# Patient Record
Sex: Female | Born: 1945 | Race: White | Hispanic: No | Marital: Single | Smoking: Never smoker
Health system: Southern US, Community
[De-identification: ages and names within clinical notes are randomized; demographics above are authoritative.]

## PROBLEM LIST (undated history)

## (undated) DIAGNOSIS — R4701 Aphasia: Secondary | ICD-10-CM

## (undated) DIAGNOSIS — Z9109 Other allergy status, other than to drugs and biological substances: Secondary | ICD-10-CM

## (undated) DIAGNOSIS — E041 Nontoxic single thyroid nodule: Secondary | ICD-10-CM

## (undated) HISTORY — DX: Nontoxic single thyroid nodule: E04.1

## (undated) HISTORY — DX: Other allergy status, other than to drugs and biological substances: Z91.09

## (undated) HISTORY — PX: TONSILLECTOMY: SUR1361

---

## 1969-12-16 HISTORY — PX: BREAST EXCISIONAL BIOPSY: SUR124

## 2005-07-12 ENCOUNTER — Ambulatory Visit: Payer: Self-pay | Admitting: Otolaryngology

## 2013-01-12 ENCOUNTER — Encounter: Payer: Self-pay | Admitting: Internal Medicine

## 2013-01-12 ENCOUNTER — Ambulatory Visit (INDEPENDENT_AMBULATORY_CARE_PROVIDER_SITE_OTHER): Payer: Medicare Other | Admitting: Internal Medicine

## 2013-01-12 VITALS — BP 110/70 | HR 79 | Temp 99.2°F | Ht 63.0 in | Wt 96.5 lb

## 2013-01-12 DIAGNOSIS — R634 Abnormal weight loss: Secondary | ICD-10-CM

## 2013-01-12 DIAGNOSIS — Z139 Encounter for screening, unspecified: Secondary | ICD-10-CM

## 2013-01-12 DIAGNOSIS — E041 Nontoxic single thyroid nodule: Secondary | ICD-10-CM | POA: Diagnosis not present

## 2013-01-13 ENCOUNTER — Encounter: Payer: Self-pay | Admitting: Internal Medicine

## 2013-01-13 DIAGNOSIS — E041 Nontoxic single thyroid nodule: Secondary | ICD-10-CM | POA: Insufficient documentation

## 2013-01-13 NOTE — Progress Notes (Signed)
  Subjective:    Patient ID: Carol Beard, female    DOB: May 17, 1946, 67 y.o.   MRN: [id number]  HPI 67 year old female with past history of thyroid nodule and weight loss who comes in today to follow up on these issues as well as to establish care.  She states that she has not had a primary care physician.  She previously was seeing Dr Andee Poles for her thyroid.  States she had two biopsies - negative.  Unsure if nodule is growing.  No significant trouble swallowing.  No acid reflux.  No chest pain or tightness.  No sob.  No significant abdominal pain or cramping.  She does report occasionally noticing some LLQ discomfort.  Not sure of any precipitating factors.  No urinary change.  She did start a walking program last year.  States she walks 30 minutes per day.  She also cut back on her po intake (ie no snacks, etc).  States that two years ago, she weight 120 lbs.    Past Medical History  Diagnosis Date  . Environmental allergies   . Thyroid nodule     biopsy x 2 negative    Review of Systems Patient denies any headache, lightheadedness or dizziness.  No allergy symptoms or sinus congestion.  No chest pain, tightness or palpitations.  No increased shortness of breath, cough or congestion.  No nausea or vomiting.  No acid reflux.  No significant abdominal pain or cramping. Does report the occasional LLQ discomfort.   No bowel change, such as diarrhea, constipation, BRBPR or melana.  No urine change.   Increased fatigue.       Objective:   Physical Exam Filed Vitals:   01/12/13 1327  BP: 110/70  Pulse: 79  Temp: 99.2 F (11.34 C)   67 year old female in no acute distress.   HEENT:  Nares- clear.  Oropharynx - without lesions. NECK:  Supple.  Nontender.  No audible bruit.  Palpable nodule - thyroid.  Nontender.   HEART:  Appears to be regular. LUNGS:  No crackles or wheezing audible.  Respirations even and unlabored.  RADIAL PULSE:  Equal bilaterally.   ABDOMEN:  Soft, nontender.  Bowel  sounds present and normal.  No audible abdominal bruit.   EXTREMITIES:  No increased edema present.  DP pulses palpable and equal bilaterally.      NEURO:  No focal neuro deficits noted.  SKIN:  No rash.        Assessment & Plan:  FATIGUE.  Check cbc, met c and tsh.    WIEGHT LOSS.  Discussed at length with her today.  She is eating, but has adjusted her diet a lot.  Discussed the need to eat regular meals.  Will check cbc, met c and tsh.  Schedule mammogram.  She has never had a colonoscopy.  Will need to arrange.  Will pursue above first.  Will notify me when agreeable.  Follow weight.    CARDIOVASCULAR.  Asymptomatic.  Follow.  Exercises regularly.    PULMONARY.  No cough or congestion.  No sob.  Follow.  May need cxr for the weight loss.    HEALTH MAINTENANCE.  Will schedule a physical - next visit.  Schedule mammogram.  GI as outlined.    I spent over 45 minutes with this patient and more than 50% of the time was spent in consultation regarding the above.

## 2013-01-13 NOTE — Assessment & Plan Note (Signed)
Obtain records from [facility].  Check tsh, free t3 and free t4.  Further w/up pending.  Has had two biopsies.  Negative.

## 2013-01-20 ENCOUNTER — Telehealth: Payer: Self-pay | Admitting: Internal Medicine

## 2013-01-20 NOTE — Telephone Encounter (Signed)
Patient called wanting the physician to call the insurance company to see if her vitamin D will be covered . They needed a diagnosis code and the patient did not have one to give for her blood work . She will have labs done on 2.7.14.

## 2013-01-20 NOTE — Telephone Encounter (Signed)
I did not order a vitamin D level to be checked because the diagnosis code is screening - insurance will not cover this.

## 2013-01-20 NOTE — Telephone Encounter (Signed)
Left message for patient to return call.

## 2013-01-20 NOTE — Telephone Encounter (Signed)
Patient returned call and was notified.

## 2013-01-22 ENCOUNTER — Other Ambulatory Visit (INDEPENDENT_AMBULATORY_CARE_PROVIDER_SITE_OTHER): Payer: Medicare Other

## 2013-01-22 ENCOUNTER — Other Ambulatory Visit: Payer: Medicare Other

## 2013-01-22 ENCOUNTER — Telehealth: Payer: Self-pay | Admitting: Internal Medicine

## 2013-01-22 DIAGNOSIS — E041 Nontoxic single thyroid nodule: Secondary | ICD-10-CM | POA: Diagnosis not present

## 2013-01-22 DIAGNOSIS — R634 Abnormal weight loss: Secondary | ICD-10-CM

## 2013-01-22 LAB — COMPREHENSIVE METABOLIC PANEL
CO2: 27 mEq/L (ref 19–32)
Calcium: 9.5 mg/dL (ref 8.4–10.5)
Chloride: 102 mEq/L (ref 96–112)
Creatinine, Ser: 1 mg/dL (ref 0.4–1.2)
GFR: 62.58 mL/min (ref >60.00)
Glucose, Bld: 93 mg/dL (ref 70–99)
Total Bilirubin: 0.9 mg/dL (ref 0.3–1.2)
Total Protein: 7.6 g/dL (ref 6.0–8.3)

## 2013-01-22 LAB — CBC WITH DIFFERENTIAL/PLATELET
Basophils Relative: 1.1 % (ref 0.0–3.0)
Eosinophils Relative: 1.5 % (ref 0.0–5.0)
HCT: 40.2 % (ref 36.0–46.0)
Hemoglobin: 13.6 g/dL (ref 12.0–15.0)
Lymphs Abs: 1.1 10*3/uL (ref 0.7–4.0)
MCV: 93.3 fl (ref 78.0–100.0)
Monocytes Absolute: 0.3 10*3/uL (ref 0.1–1.0)
Monocytes Relative: 5.5 % (ref 3.0–12.0)
RBC: 4.31 Mil/uL (ref 3.87–5.11)
WBC: 5.6 10*3/uL (ref 4.5–10.5)

## 2013-01-22 LAB — T3, FREE: T3, Free: 2.7 pg/mL (ref 2.3–4.2)

## 2013-01-22 NOTE — Telephone Encounter (Signed)
Patient wanted to know if you felt she needs a welcome to medicare visit or if you want to just discuss this with her in march.

## 2013-01-22 NOTE — Telephone Encounter (Signed)
To my knowledge I have not seen this pt.  We can put her on a waiting list to schedule earlier if someone cx's but I am not sure we will get her in before 02/12/13.  Do you mind calling pt and letting her know.  Thanks.

## 2013-01-22 NOTE — Telephone Encounter (Signed)
Pt came in today for labs and wanted to let dr scott know that her the first  12 months of being on medicare. Will be end of feb .  She has cpe for march 28 13  Is this ok or does she need to come in sooner for welcome to medicare

## 2013-01-22 NOTE — Telephone Encounter (Signed)
Patient waiting on a call back from the nurse.

## 2013-01-24 NOTE — Telephone Encounter (Signed)
Can you explain to her that her welcome to medicare visit and a complete physical exam are two different things.  If both are done on that day - my understanding - there are two charges.  Let me know if I am wrong.  i can probably work her in somewhere for her welcome to medicare visit.  Let me know.

## 2013-02-01 NOTE — Telephone Encounter (Signed)
I called and spoke with patient and she understands that if she comes in and discusses any new symptoms or cold etc. She would be charged two separate fees. Patient doesn't feel the need to make a welcome to medicare visit.  Patient looks forward to seeing you in March.

## 2013-02-10 ENCOUNTER — Ambulatory Visit: Payer: Self-pay | Admitting: Internal Medicine

## 2013-02-10 DIAGNOSIS — R928 Other abnormal and inconclusive findings on diagnostic imaging of breast: Secondary | ICD-10-CM | POA: Diagnosis not present

## 2013-02-10 DIAGNOSIS — Z1231 Encounter for screening mammogram for malignant neoplasm of breast: Secondary | ICD-10-CM | POA: Diagnosis not present

## 2013-02-16 ENCOUNTER — Encounter: Payer: Self-pay | Admitting: Internal Medicine

## 2013-03-05 ENCOUNTER — Encounter: Payer: Self-pay | Admitting: Internal Medicine

## 2013-03-12 ENCOUNTER — Encounter: Payer: Medicare Other | Admitting: Internal Medicine

## 2013-04-13 ENCOUNTER — Other Ambulatory Visit (HOSPITAL_COMMUNITY)
Admission: RE | Admit: 2013-04-13 | Discharge: 2013-04-13 | Disposition: A | Discharge status: Home / Self Care | Payer: Medicare Other | Source: Ambulatory Visit | Attending: Internal Medicine | Admitting: Internal Medicine

## 2013-04-13 ENCOUNTER — Encounter: Payer: Self-pay | Admitting: Internal Medicine

## 2013-04-13 ENCOUNTER — Ambulatory Visit (INDEPENDENT_AMBULATORY_CARE_PROVIDER_SITE_OTHER): Payer: Medicare Other | Admitting: Internal Medicine

## 2013-04-13 VITALS — BP 120/80 | HR 76 | Temp 99.1°F | Ht 63.0 in | Wt 96.0 lb

## 2013-04-13 DIAGNOSIS — R1032 Left lower quadrant pain: Secondary | ICD-10-CM | POA: Diagnosis not present

## 2013-04-13 DIAGNOSIS — Z01419 Encounter for gynecological examination (general) (routine) without abnormal findings: Secondary | ICD-10-CM | POA: Insufficient documentation

## 2013-04-13 DIAGNOSIS — Z124 Encounter for screening for malignant neoplasm of cervix: Secondary | ICD-10-CM

## 2013-04-13 DIAGNOSIS — E041 Nontoxic single thyroid nodule: Secondary | ICD-10-CM

## 2013-04-13 DIAGNOSIS — Z1151 Encounter for screening for human papillomavirus (HPV): Secondary | ICD-10-CM | POA: Diagnosis not present

## 2013-04-18 ENCOUNTER — Encounter: Payer: Self-pay | Admitting: Internal Medicine

## 2013-04-18 NOTE — Assessment & Plan Note (Signed)
Obtain records from [facility].  Check tsh, free t3 and free t4.  Has had two biopsies.  Negative.

## 2013-04-18 NOTE — Progress Notes (Signed)
  Subjective:    Patient ID: Carol Beard, female    DOB: 16-Jun-1946, 67 y.o.   MRN: [id number]  HPI 67 year old female with past history of thyroid nodule and weight loss who comes in today to follow up on these issues as well as for a complete physical exam.  She previously was seeing Dr Andee Poles for her thyroid.  States she had two biopsies - negative.  No significant trouble swallowing.  No acid reflux. No chest pain or tightness.  No sob.  Does report some lower abdominal pain - occasional  LLQ discomfort.  Not sure of any precipitating factors.  No urinary change.  She did start a walking program last year.  States she walks 30 minutes per day.  She also cut back on her po intake (ie no snacks, etc).  We had discussed last visit - eating regular meals, etc.  She is eating better.     Past Medical History  Diagnosis Date  . Environmental allergies   . Thyroid nodule     biopsy x 2 negative    Review of Systems Patient denies any headache, lightheadedness or dizziness.  Some allergy symptoms with nasal congestion.  No chest pain, tightness or palpitations.  No increased shortness of breath, cough or congestion.  No nausea or vomiting.  No acid reflux.  No significant abdominal pain or cramping. Does report the occasional LLQ discomfort.   No bowel change, such as diarrhea, constipation, BRBPR or melana.  No urine change.         Objective:   Physical Exam  Filed Vitals:   04/13/13 1519  BP: 120/80  Pulse: 76  Temp: 99.1 F (78.72 C)   67 year old female in no acute distress.   HEENT:  Nares- clear.  Oropharynx - without lesions. NECK:  Supple.  Nontender.  No audible bruit.  HEART:  Appears to be regular. LUNGS:  No crackles or wheezing audible.  Respirations even and unlabored.  RADIAL PULSE:  Equal bilaterally.    BREASTS:  No nipple discharge or nipple retraction present.  Could not appreciate any distinct nodules or axillary adenopathy.  ABDOMEN:  Soft, nontender.  Bowel  sounds present and normal.  No audible abdominal bruit.  GU:  Normal external genitalia.  Vaginal vault without lesions.  Cervix identified.  Pap performed. Appears to have a cervical polyp.  Could not appreciate any adnexal masses or tenderness.   RECTAL:  Heme negative.   EXTREMITIES:  No increased edema present.  DP pulses palpable and equal bilaterally.          Assessment & Plan:  WIEGHT LOSS.  Discussed at length with her last visit. She was eating, but had adjusted her diet a lot.  Discussed the need to eat regular meals.  Follow weight.  Stable.    CERVICAL POLYP.  Refer to gyn for evaluation.    LLQ PAIN. Seeing gyn for the cervical polyp.  Will have them evaluate her LLQ pain.  Will need pelvic ultrasound.     CARDIOVASCULAR.  Asymptomatic.  Follow.  Exercises regularly.    PULMONARY.  No cough or congestion.  No sob.  Follow.    HEALTH MAINTENANCE.  Physical today.  Pap performed.  Mammogram 02/10/13 - Birads II.  Will need colonoscopy once above issues straightened out.

## 2013-04-22 DIAGNOSIS — R1031 Right lower quadrant pain: Secondary | ICD-10-CM | POA: Diagnosis not present

## 2013-05-11 DIAGNOSIS — R1032 Left lower quadrant pain: Secondary | ICD-10-CM | POA: Diagnosis not present

## 2013-05-11 DIAGNOSIS — N949 Unspecified condition associated with female genital organs and menstrual cycle: Secondary | ICD-10-CM | POA: Diagnosis not present

## 2013-06-15 ENCOUNTER — Encounter: Payer: Self-pay | Admitting: Internal Medicine

## 2013-06-15 ENCOUNTER — Ambulatory Visit (INDEPENDENT_AMBULATORY_CARE_PROVIDER_SITE_OTHER): Payer: Medicare Other | Admitting: Internal Medicine

## 2013-06-15 VITALS — BP 102/80 | HR 66 | Temp 98.4°F | Ht 63.0 in | Wt 93.8 lb

## 2013-06-15 DIAGNOSIS — R928 Other abnormal and inconclusive findings on diagnostic imaging of breast: Secondary | ICD-10-CM | POA: Diagnosis not present

## 2013-06-15 DIAGNOSIS — R002 Palpitations: Secondary | ICD-10-CM

## 2013-06-15 DIAGNOSIS — R413 Other amnesia: Secondary | ICD-10-CM

## 2013-06-15 DIAGNOSIS — R209 Unspecified disturbances of skin sensation: Secondary | ICD-10-CM

## 2013-06-15 DIAGNOSIS — R9431 Abnormal electrocardiogram [ECG] [EKG]: Secondary | ICD-10-CM

## 2013-06-15 DIAGNOSIS — Z13220 Encounter for screening for lipoid disorders: Secondary | ICD-10-CM | POA: Diagnosis not present

## 2013-06-15 DIAGNOSIS — N6489 Other specified disorders of breast: Secondary | ICD-10-CM

## 2013-06-15 DIAGNOSIS — N63 Unspecified lump in unspecified breast: Secondary | ICD-10-CM

## 2013-06-15 DIAGNOSIS — E041 Nontoxic single thyroid nodule: Secondary | ICD-10-CM

## 2013-06-15 DIAGNOSIS — R200 Anesthesia of skin: Secondary | ICD-10-CM

## 2013-06-15 DIAGNOSIS — R109 Unspecified abdominal pain: Secondary | ICD-10-CM

## 2013-06-15 DIAGNOSIS — E2839 Other primary ovarian failure: Secondary | ICD-10-CM

## 2013-06-16 ENCOUNTER — Encounter: Payer: Self-pay | Admitting: Internal Medicine

## 2013-06-16 ENCOUNTER — Telehealth: Payer: Self-pay | Admitting: Internal Medicine

## 2013-06-16 DIAGNOSIS — N6489 Other specified disorders of breast: Secondary | ICD-10-CM | POA: Insufficient documentation

## 2013-06-16 NOTE — Progress Notes (Signed)
Subjective:    Patient ID: Carol Beard, female    DOB: 10-06-1946, 67 y.o.   MRN: [id number]  HPI 68 year old female with past history of thyroid nodule and weight loss who comes in today for a scheduled follow up.  She previously was seeing Dr Andee Poles for her thyroid.  States she had two biopsies - negative.  States she is planning to follow up with ENT regarding her thyroid.  No significant trouble swallowing.  No acid reflux.  No sob.  Reports some fluttering.  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  Does report some persistent lower abdominal pain - occasional  LLQ discomfort.  Not sure of any precipitating factors.  No urinary change except for noticing some occasional color change with her urine.  Saw Dr Luella Cook for the pain and for a cervical polyp.  Had a pelvic ultrasound.  Negative.  Polyp not removed.  No vaginal bleeding.  She did start a walking program last year.  States she walks 30 minutes per day.  She also cut back on her po intake (ie no snacks, etc).  We again discussed - eating regular meals, etc.  She is eating better.  She does report that her ears are stopped up.   Has had problems with wax build up in the past.  She also is concerned that she may have carpal tunnel syndrome.  Reports some discomfort and numbness in her fingers/hand.  Right is worse.  Is on the computer a lot.  She is also concerned regarding her memory.  States she may walk in a room and forget what she was looking for.  Some difficulty swelling.  Used to be a good speller.  Had questions about her breast density.     Past Medical History  Diagnosis Date  . Environmental allergies   . Thyroid nodule     biopsy x 2 negative    Current Outpatient Prescriptions on File Prior to Visit  Medication Sig Dispense Refill  . SALINE NASAL SPRAY NA Place into the nose as needed.       No current facility-administered medications on file prior to visit.    Review of Systems Patient denies any  headache, lightheadedness or dizziness.  No significant sinus or allergy symptoms.  Does report the occasional fluttering as outlined.  States she was told had a heart murmur previously.  No increased shortness of breath, cough or congestion.  No nausea or vomiting.  No acid reflux.  Does report the lower quadrant pain as outlined.  No bowel change, such as diarrhea, constipation, BRBPR or melana.  No urine change except for color change.  Ears stopped up as outlined.  Reports the hand/finger pain and numbness as outlined.          Objective:   Physical Exam  Filed Vitals:   06/15/13 1334  BP: 102/80  Pulse: 66  Temp: 98.4 F (51.5 C)   67 year old female in no acute distress.   HEENT:  Nares- clear.  Oropharynx - without lesions. NECK:  Supple.  Nontender.  No audible bruit.  HEART:  Appears to be regular. LUNGS:  No crackles or wheezing audible.  Respirations even and unlabored.  RADIAL PULSE:  Equal bilaterally.    BREASTS:  No nipple discharge or nipple retraction present.  Could not appreciate any distinct nodules or axillary adenopathy except for some fulness in the 2:00 region of the left breast.  Located just beneath her  scar.   ABDOMEN:  Soft, nontender.  Bowel sounds present and normal.  No audible abdominal bruit.  EXTREMITIES:  No increased edema present.  DP pulses palpable and equal bilaterally.  MSK:  Question of phalens.  Negative Tinels.  Grip strength normal.           Assessment & Plan:  WIEGHT LOSS.  Discussed at length with her last visit and this visit.  She was eating, but had adjusted her diet a lot.  Discussed the need to eat regular meals.  Follow weight.  GYN w/up as outlined.  GI w/up planned.     CERVICAL POLYP.  Referred to gyn.  Did not feel needed removing.    LLQ PAIN. Saw GYN.  Had pelvic ultrasound.  Felt pan not to be coming from a gyn source.  States ultrasound checked out fine.  Recommended GI w/up.      CARDIOVASCULAR.  Has the intermittent  fluttering as outlined.  EKG obtained and revealed SR with TWI in I, aVL , v1 and v2.  No chest pain.  Will obtain ECHO to evaluate for any wall motion abnormality, valve status and heart function.     PULMONARY.  No cough or congestion.  No sob.  Follow.   BREAST FULLNESS.  Palpable area 2 o'clock region left breast.  Will obtain left breast ultrasound to further evaluate.  Had mammogram 02/10/13.     HEALTH MAINTENANCE.  Physical 04/13/13.  Mammogram 02/10/13 - Birads II.  Discussed the need for colonoscopy.  She will notify me when agreeable.  Check bone density.     I spent over 45 minutes with her at this visit and more than 50% of the time was spent in consultation regarding the above.

## 2013-06-16 NOTE — Telephone Encounter (Signed)
Pt came in today wanting to talk to dr scotts nurse about concerns she has about her ultrasound of the breast   Pt didn't have ultra sound done today she had some concerns about clinical issues and she would like to discuss. Pt wont' be back home until 4:30 today

## 2013-06-16 NOTE — Assessment & Plan Note (Signed)
Exam as outlined.  Just had mammogram 02/10/13 - Birads II.  Schedule left breast ultrasound.

## 2013-06-16 NOTE — Assessment & Plan Note (Signed)
Has had two biopsies.  Negative.  Planning to follow up with ENT.

## 2013-06-16 NOTE — Telephone Encounter (Signed)
Spoke with pt & she reported that she went for a Ultrasound today at [facility] & they told her that she had to have a left mammogram prior to the ultrasound. And that stated that she had a nodule (we gave them that dx).  And that was the first time that she had anything about a nodule. She stated that she mentioned to you that she had a h/o fibroid adenoma, but never a nodule. She was very alarmed, and decided not to proceed with any images today. Please advise

## 2013-06-17 NOTE — Telephone Encounter (Signed)
Pt informed-and instructed to call back to [facility] & r/s at her convenience

## 2013-06-17 NOTE — Telephone Encounter (Signed)
I spoke to radiology.  Apparently they have a protocol that if we can feel anything on the breast - it requires a full work up.  Since I can feel the palpable area (around the scar) - she would need a left mammogram and ultrasound.  They will not do just the ultrasound.  Also, regarding the term "nodule" - this was used to order the test (a palpable area on exam. - may be scar tissue).

## 2013-06-18 ENCOUNTER — Encounter: Payer: Self-pay | Admitting: Internal Medicine

## 2013-06-18 DIAGNOSIS — R413 Other amnesia: Secondary | ICD-10-CM | POA: Insufficient documentation

## 2013-06-18 DIAGNOSIS — R200 Anesthesia of skin: Secondary | ICD-10-CM | POA: Insufficient documentation

## 2013-06-18 NOTE — Assessment & Plan Note (Signed)
Able to answer questions appropriately.  Discussed multiple contributing factors that could affect memory.  Recent tsh wnl.  Will check B12.  No focal neurological abnormality noted on exam.  Hold on MRI.  Follow.

## 2013-06-18 NOTE — Assessment & Plan Note (Signed)
Positive phalens.  Negative tinels.  Wrist splint.  Follow.

## 2013-06-23 ENCOUNTER — Ambulatory Visit: Payer: Medicare Other

## 2013-06-23 ENCOUNTER — Other Ambulatory Visit (INDEPENDENT_AMBULATORY_CARE_PROVIDER_SITE_OTHER): Payer: Medicare Other

## 2013-06-23 DIAGNOSIS — Z136 Encounter for screening for cardiovascular disorders: Secondary | ICD-10-CM

## 2013-06-23 DIAGNOSIS — R109 Unspecified abdominal pain: Secondary | ICD-10-CM

## 2013-06-23 DIAGNOSIS — Z13220 Encounter for screening for lipoid disorders: Secondary | ICD-10-CM

## 2013-06-23 DIAGNOSIS — R413 Other amnesia: Secondary | ICD-10-CM | POA: Diagnosis not present

## 2013-06-23 LAB — URINALYSIS, ROUTINE W REFLEX MICROSCOPIC
Ketones, ur: NEGATIVE
Urine Glucose: NEGATIVE
Urobilinogen, UA: 1 (ref 0.0–1.0)

## 2013-06-23 LAB — LIPID PANEL
Cholesterol: 180 mg/dL (ref 0–200)
HDL: 78.3 mg/dL (ref >39.00)
LDL Cholesterol: 90 mg/dL (ref 0–99)
Triglycerides: 58 mg/dL (ref 0.0–149.0)
VLDL: 11.6 mg/dL (ref 0.0–40.0)

## 2013-06-23 LAB — VITAMIN B12: Vitamin B-12: 522 pg/mL (ref 211–911)

## 2013-06-25 ENCOUNTER — Encounter: Payer: Self-pay | Admitting: *Deleted

## 2013-06-25 LAB — URINE CULTURE: Organism ID, Bacteria: NO GROWTH

## 2013-06-30 ENCOUNTER — Ambulatory Visit: Payer: Self-pay | Admitting: Internal Medicine

## 2013-06-30 DIAGNOSIS — M810 Age-related osteoporosis without current pathological fracture: Secondary | ICD-10-CM | POA: Diagnosis not present

## 2013-06-30 DIAGNOSIS — E349 Endocrine disorder, unspecified: Secondary | ICD-10-CM | POA: Diagnosis not present

## 2013-06-30 DIAGNOSIS — N958 Other specified menopausal and perimenopausal disorders: Secondary | ICD-10-CM | POA: Diagnosis not present

## 2013-06-30 DIAGNOSIS — Z780 Asymptomatic menopausal state: Secondary | ICD-10-CM | POA: Diagnosis not present

## 2013-06-30 LAB — HM DEXA SCAN

## 2013-07-01 ENCOUNTER — Encounter: Payer: Self-pay | Admitting: Internal Medicine

## 2013-07-01 ENCOUNTER — Other Ambulatory Visit: Payer: Medicare Other

## 2013-07-05 ENCOUNTER — Encounter: Payer: Self-pay | Admitting: Internal Medicine

## 2013-07-05 ENCOUNTER — Ambulatory Visit (INDEPENDENT_AMBULATORY_CARE_PROVIDER_SITE_OTHER): Payer: Medicare Other | Admitting: Internal Medicine

## 2013-07-05 VITALS — BP 120/80 | HR 73 | Temp 98.7°F | Ht 63.0 in | Wt 93.0 lb

## 2013-07-05 DIAGNOSIS — M810 Age-related osteoporosis without current pathological fracture: Secondary | ICD-10-CM | POA: Diagnosis not present

## 2013-07-05 NOTE — Progress Notes (Signed)
  Subjective:    Patient ID: Carol Beard, female    DOB: 1946/03/28, 67 y.o.   MRN: [id number]  HPI 67 year old female with past history of thyroid nodule and weight loss who comes in today as a work in to discuss her bone density results.  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  L-S spine -2.5.  Discussed various treatment options.  Discussed the need for calcium, vitamin D and weight bearing exercises.   Discussed my desire to start her on a bisphosphonate.     Past Medical History  Diagnosis Date  . Environmental allergies   . Thyroid nodule     biopsy x 2 negative    Current Outpatient Prescriptions on File Prior to Visit  Medication Sig Dispense Refill  . SALINE NASAL SPRAY NA Place into the nose as needed.       No current facility-administered medications on file prior to visit.    Review of Systems discussed results of bone density.  Discussed various treatment options and the need for calcium, vitamin D and weight bearing exercise.  Discussed possible side effects of treatment.  She denies any swallowing problems, acid reflux or dysphagia.  No nausea or vomiting.  Is walking.           Objective:   Physical Exam  Filed Vitals:   07/05/13 1416  BP: 120/80  Pulse: 73  Temp: 98.7 F (30.80 C)   67 year old female in no acute distress.   Physical exam not performed today given nature of this visit.           Assessment & Plan:  OSTEOPOROSIS.  Discussed various treatment options.  Discussed the need for calcium, vitamin D and weight bearing exercise.  Discussed my desire to start a bisphosphonate.  She wants to think about starting the medication.  Will check 25 hydroxyvitamin D.      CERVICAL POLYP.  Referred to gyn.  Did not feel needed removing.    LLQ PAIN. Saw GYN.  Had pelvic ultrasound.  Felt pan not to be coming from a gyn source.  States ultrasound checked out fine.  Recommended GI w/up.      CARDIOVASCULAR.  Has  the intermittent fluttering as outlined.  EKG obtained and revealed SR with TWI in I, aVL , v1 and v2.  No chest pain.  Will obtain ECHO to evaluate for any wall motion abnormality, valve status and heart function.    HEALTH MAINTENANCE.  Physical 04/13/13.  Mammogram 02/10/13 - Birads II.  Have discussed the need for colonoscopy.  She will notify me when agreeable.  Bone density as outlined.

## 2013-07-06 ENCOUNTER — Other Ambulatory Visit: Payer: Self-pay

## 2013-07-06 ENCOUNTER — Other Ambulatory Visit (INDEPENDENT_AMBULATORY_CARE_PROVIDER_SITE_OTHER): Payer: Medicare Other

## 2013-07-06 ENCOUNTER — Encounter: Payer: Self-pay | Admitting: *Deleted

## 2013-07-06 DIAGNOSIS — R002 Palpitations: Secondary | ICD-10-CM

## 2013-07-06 DIAGNOSIS — R9431 Abnormal electrocardiogram [ECG] [EKG]: Secondary | ICD-10-CM

## 2013-07-07 ENCOUNTER — Encounter: Payer: Self-pay | Admitting: *Deleted

## 2013-07-22 ENCOUNTER — Encounter: Payer: Self-pay | Admitting: Internal Medicine

## 2013-08-06 ENCOUNTER — Ambulatory Visit: Payer: Medicare Other | Admitting: Internal Medicine

## 2013-09-03 DIAGNOSIS — H6120 Impacted cerumen, unspecified ear: Secondary | ICD-10-CM | POA: Diagnosis not present

## 2013-09-03 DIAGNOSIS — J301 Allergic rhinitis due to pollen: Secondary | ICD-10-CM | POA: Diagnosis not present

## 2013-09-03 DIAGNOSIS — E041 Nontoxic single thyroid nodule: Secondary | ICD-10-CM | POA: Diagnosis not present

## 2013-09-06 ENCOUNTER — Ambulatory Visit (INDEPENDENT_AMBULATORY_CARE_PROVIDER_SITE_OTHER): Payer: Medicare Other | Admitting: Internal Medicine

## 2013-09-06 ENCOUNTER — Encounter: Payer: Self-pay | Admitting: Internal Medicine

## 2013-09-06 VITALS — BP 120/82 | HR 70 | Temp 98.4°F | Ht 63.0 in | Wt 91.2 lb

## 2013-09-06 DIAGNOSIS — N390 Urinary tract infection, site not specified: Secondary | ICD-10-CM

## 2013-09-06 DIAGNOSIS — R413 Other amnesia: Secondary | ICD-10-CM

## 2013-09-06 DIAGNOSIS — R209 Unspecified disturbances of skin sensation: Secondary | ICD-10-CM | POA: Diagnosis not present

## 2013-09-06 DIAGNOSIS — L989 Disorder of the skin and subcutaneous tissue, unspecified: Secondary | ICD-10-CM

## 2013-09-06 DIAGNOSIS — M25569 Pain in unspecified knee: Secondary | ICD-10-CM

## 2013-09-06 DIAGNOSIS — R200 Anesthesia of skin: Secondary | ICD-10-CM

## 2013-09-06 DIAGNOSIS — E041 Nontoxic single thyroid nodule: Secondary | ICD-10-CM

## 2013-09-06 DIAGNOSIS — R634 Abnormal weight loss: Secondary | ICD-10-CM

## 2013-09-06 MED ORDER — NYSTATIN 100000 UNIT/GM EX CREA: TOPICAL_CREAM | Freq: Two times a day (BID) | CUTANEOUS | Status: DC

## 2013-09-07 LAB — POCT URINALYSIS DIPSTICK
Bilirubin, UA: NEGATIVE
Leukocytes, UA: NEGATIVE
Spec Grav, UA: 1.01
Urobilinogen, UA: 0.2

## 2013-09-08 ENCOUNTER — Ambulatory Visit: Payer: Self-pay | Admitting: Otolaryngology

## 2013-09-08 DIAGNOSIS — E041 Nontoxic single thyroid nodule: Secondary | ICD-10-CM | POA: Diagnosis not present

## 2013-09-09 ENCOUNTER — Encounter: Payer: Self-pay | Admitting: Internal Medicine

## 2013-09-09 DIAGNOSIS — L989 Disorder of the skin and subcutaneous tissue, unspecified: Secondary | ICD-10-CM | POA: Insufficient documentation

## 2013-09-09 DIAGNOSIS — M25569 Pain in unspecified knee: Secondary | ICD-10-CM | POA: Insufficient documentation

## 2013-09-09 NOTE — Progress Notes (Signed)
Subjective:    Patient ID: Carol Beard, female    DOB: Jun 26, 1946, 67 y.o.   MRN: [id number]  HPI 67 year old female with past history of thyroid nodule and weight loss who comes in today for a scheduled follow up.   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  L-S spine -2.5.  Discussed various treatment options last visit.  Discussed the need for calcium, vitamin D and weight bearing exercises.   Discussed my desire to start her on a bisphosphonate.  she declined.  She is taking calcium.  She also states that she has started a strength training class.  She saw Dr Andee Poles last week.  Is planning for an ultrasound of her neck (09/08/13).  Is walking.  No cardiac symptoms with increased activity or exertion. Has lost a little more weight.  States she is eating more regular.  Exercising more.  She is agreeable now to a colonoscopy.  Will arrange.  Bowels stable.  She has a left leg lesion that she would like for Dr Gwen Pounds to evaluate.  Would also like having a general skin survey.  She also has had problems with her knees.  Has been evaluated previously and told decrease cartilage.  She request referral to Dr Ernest Pine for reevaluation.  She also reports some peri vaginal irritation.  Some discomfort with urination.  No vaginal discharge.  The irritation is on the outside of her vagina.       Past Medical History  Diagnosis Date  . Environmental allergies   . Thyroid nodule     biopsy x 2 negative    Current Outpatient Prescriptions on File Prior to Visit  Medication Sig Dispense Refill  . SALINE NASAL SPRAY NA Place into the nose as needed.       No current facility-administered medications on file prior to visit.    Review of Systems Patient denies any headache, lightheadedness or dizziness.  No sinus or allergy symptoms.  No chest pain, tightness or palpitations.  No increased shortness of breath, cough or congestion.  No nausea or vomiting.  No acid  reflux.  No abdominal pain or cramping.  No bowel change, such as diarrhea, constipation, BRBPR or melana.  Vaginal irritation as outlined.  Some knee discomfort.  Walking more.  Eating.  Has lost a little more weight.             Objective:   Physical Exam  Filed Vitals:   09/06/13 1559  BP: 120/82  Pulse: 70  Temp: 98.4 F (26.56 C)   67 year old female in no acute distress.   HEENT:  Nares- clear.  Oropharynx - without lesions. NECK:  Supple.  Nontender.  No audible bruit.  HEART:  Appears to be regular. LUNGS:  No crackles or wheezing audible.  Respirations even and unlabored.  RADIAL PULSE:  Equal bilaterally.  ABDOMEN:  Soft, nontender.  Bowel sounds present and normal.  No audible abdominal bruit.  GU:  Normal external genitalia - minimal erythema.  No other lesions.    EXTREMITIES:  No increased edema present.  DP pulses palpable and equal bilaterally.      SKIN:  Left leg lesion.      Assessment & Plan:  OSTEOPOROSIS.  Have discussed various treatment options.  Is taking caltrate now.  Going to a strength training class.  Follow.     CERVICAL POLYP.  Referred to gyn.  Did not feel needed removing.  LLQ PAIN. Saw GYN.  Had pelvic ultrasound.  Felt pan not to be coming from a gyn source.  States ultrasound checked out fine.  Recommended GI w/up.  She is agreeable to colonoscopy.  No significant pain reported today.     CARDIOVASCULAR.  Has the intermittent fluttering as outlined.  EKG obtained and revealed SR with TWI in I, aVL , v1 and v2.  No chest pain.  ECHO recently revealed normal heart function with mild to moderate TR.  Currently doing well.  Follow.   VAGINAL IRRITATION.  Nystatin cream as outlined.  Check urine to confirm no infection.    HEALTH MAINTENANCE.  Physical 04/13/13.  Mammogram 02/10/13 - Birads II.  Have discussed the need for colonoscopy.  She is agreeable.  Refer to GI.  Bone density as outlined.    I spent 45 minutes with the patient and more than  50% of the time was spent in consultation regarding the above.

## 2013-09-09 NOTE — Assessment & Plan Note (Signed)
Persistent leg lesion.  Also desires skin survey.  Refer to Dr Gwen Pounds for evaluation.

## 2013-09-09 NOTE — Assessment & Plan Note (Addendum)
Positive phalens.  Negative tinels.  Using a wrist splint.  Request nerve conduction studies.

## 2013-09-09 NOTE — Assessment & Plan Note (Signed)
Recent tsh wnl.  Will check B12.  No focal neurological abnormality noted on exam.  Hold on MRI.  Follow.

## 2013-09-09 NOTE — Assessment & Plan Note (Signed)
Has had two biopsies.  Negative.  Planning follow up thyroid ultrasound 09/08/13.

## 2013-09-09 NOTE — Assessment & Plan Note (Signed)
Has been worked up previously.  Has been told - decreased cartilage.  Request referral to ortho for evaluation.  Desires to see Dr Ernest Pine.

## 2013-09-21 ENCOUNTER — Telehealth: Payer: Self-pay | Admitting: Internal Medicine

## 2013-09-21 NOTE — Telephone Encounter (Signed)
Msg in regard to colonoscopy appt. that Dr. Lorin Picket set her up for.  Pt has already gone to do the prep work but pt had a concern.  Pt has regurgitation in the heart and it occurred to pt that she really has not checked with Dr. Lorin Picket to see if that would cause a problem in regard to the colonoscopy procedure.  Pt asking if it would be a problem and also if she needs to tell Dr. Earnest Conroy staff about this.  Ok to leave msg.

## 2013-09-21 NOTE — Telephone Encounter (Signed)
Not this should not be a problem and yes she can tell Dr Elliot's staff - just to make them aware.

## 2013-09-21 NOTE — Telephone Encounter (Signed)
Left response on patient voicemail

## 2013-09-22 DIAGNOSIS — M79609 Pain in unspecified limb: Secondary | ICD-10-CM | POA: Diagnosis not present

## 2013-10-21 ENCOUNTER — Ambulatory Visit: Payer: Self-pay | Admitting: Unknown Physician Specialty

## 2013-10-21 DIAGNOSIS — K648 Other hemorrhoids: Secondary | ICD-10-CM | POA: Diagnosis not present

## 2013-10-21 DIAGNOSIS — Z882 Allergy status to sulfonamides status: Secondary | ICD-10-CM | POA: Diagnosis not present

## 2013-10-21 DIAGNOSIS — Z800 Family history of malignant neoplasm of digestive organs: Secondary | ICD-10-CM | POA: Diagnosis not present

## 2013-10-21 DIAGNOSIS — M810 Age-related osteoporosis without current pathological fracture: Secondary | ICD-10-CM | POA: Diagnosis not present

## 2013-10-21 DIAGNOSIS — D126 Benign neoplasm of colon, unspecified: Secondary | ICD-10-CM | POA: Diagnosis not present

## 2013-10-21 DIAGNOSIS — Z8371 Family history of colonic polyps: Secondary | ICD-10-CM | POA: Diagnosis not present

## 2013-10-21 DIAGNOSIS — Z880 Allergy status to penicillin: Secondary | ICD-10-CM | POA: Diagnosis not present

## 2013-10-21 DIAGNOSIS — Z1211 Encounter for screening for malignant neoplasm of colon: Secondary | ICD-10-CM | POA: Diagnosis not present

## 2013-10-21 LAB — HM COLONOSCOPY

## 2013-10-25 ENCOUNTER — Encounter: Payer: Self-pay | Admitting: Internal Medicine

## 2013-10-25 ENCOUNTER — Ambulatory Visit (INDEPENDENT_AMBULATORY_CARE_PROVIDER_SITE_OTHER): Payer: Medicare Other | Admitting: Internal Medicine

## 2013-10-25 ENCOUNTER — Encounter (INDEPENDENT_AMBULATORY_CARE_PROVIDER_SITE_OTHER): Payer: Self-pay

## 2013-10-25 VITALS — BP 108/70 | HR 73 | Temp 98.5°F | Wt 91.0 lb

## 2013-10-25 DIAGNOSIS — R200 Anesthesia of skin: Secondary | ICD-10-CM

## 2013-10-25 DIAGNOSIS — Z86010 Personal history of colonic polyps: Secondary | ICD-10-CM

## 2013-10-25 DIAGNOSIS — R209 Unspecified disturbances of skin sensation: Secondary | ICD-10-CM | POA: Diagnosis not present

## 2013-10-25 DIAGNOSIS — L989 Disorder of the skin and subcutaneous tissue, unspecified: Secondary | ICD-10-CM

## 2013-10-25 DIAGNOSIS — M25569 Pain in unspecified knee: Secondary | ICD-10-CM | POA: Diagnosis not present

## 2013-10-25 DIAGNOSIS — E041 Nontoxic single thyroid nodule: Secondary | ICD-10-CM | POA: Diagnosis not present

## 2013-10-25 LAB — PATHOLOGY REPORT

## 2013-10-25 NOTE — Progress Notes (Signed)
Pre-visit discussion using our clinic review tool. No additional management support is needed unless otherwise documented below in the visit note.  

## 2013-10-28 ENCOUNTER — Encounter: Payer: Self-pay | Admitting: Internal Medicine

## 2013-10-28 DIAGNOSIS — Z86010 Personal history of colonic polyps: Secondary | ICD-10-CM | POA: Insufficient documentation

## 2013-10-28 NOTE — Assessment & Plan Note (Signed)
Had nerve conduction studies that revealed mild carpal tunnel.  Recommended a splint.  Follow.    

## 2013-10-28 NOTE — Assessment & Plan Note (Signed)
Persistent leg lesion.  Also desires skin survey.  Referred to Dr Gwen Pounds for evaluation.  Has upcoming appt.

## 2013-10-28 NOTE — Assessment & Plan Note (Signed)
Has had two biopsies.  Negative.  Had follow up thyroid ultrasound 09/08/13.  States everything checked out fine.  Recommended f/u with ENT in one year.  Followed by Dr Vaught.     

## 2013-10-28 NOTE — Assessment & Plan Note (Signed)
Colonoscopy 10/21/13 revealed one 5mm polyp, internal hemorrhoids and a tortuous colon.    

## 2013-10-28 NOTE — Assessment & Plan Note (Signed)
Has been worked up previously.  Has been told - decreased cartilage.  Request referral to ortho for evaluation.  Desires to see Dr Ernest Pine.  Has upcoming appt.

## 2013-10-28 NOTE — Progress Notes (Signed)
Subjective:    Patient ID: Carol Beard, female    DOB: February 25, 1946, 67 y.o.   MRN: [id number]  HPI 67 year old female with past history of thyroid nodule and weight loss who comes in today for a scheduled follow up.   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  L-S spine -2.5.  Have discussed various treatment options last visit.  Discussed the need for vitamin D and weight bearing exercises.   Discussed my desire to start her on a bisphosphonate.  She declined.  She is continuing a strength training class.  She saw Dr Andee Poles.  Had f/u thyroid ultrasound.  States everything checked out ok.  Planning for f/u in one year.  Is walking.  No cardiac symptoms with increased activity or exertion.  Weight is stable from last check.   States she is eating more regular.  Exercising more.  Bowels stable.   She also has had problems with her knees.  Has been evaluated previously and told decrease cartilage.  She request referral to Dr Ernest Pine for reevaluation.  She has an upcoming appt with him.  She did see Dr Malvin Johns.  Had nerve conduction studies.  Found to have mild carpal tunnel.  Recommended a splint.  Had her colonoscopy last week.  Had one 5mm polyp, internal hemorrhoids and a tortuous colon.         Past Medical History  Diagnosis Date  . Environmental allergies   . Thyroid nodule     biopsy x 2 negative    Current Outpatient Prescriptions on File Prior to Visit  Medication Sig Dispense Refill  . Calcium Carbonate (CALTRATE 600 PO) Take by mouth.      . nystatin cream (MYCOSTATIN) Apply topically 2 (two) times daily.  30 g  0  . SALINE NASAL SPRAY NA Place into the nose as needed.       No current facility-administered medications on file prior to visit.    Review of Systems Patient denies any headache, lightheadedness or dizziness.  No sinus or allergy symptoms.  No chest pain, tightness or palpitations.  No increased shortness of breath, cough or  congestion.  No nausea or vomiting.  No acid reflux.  No abdominal pain or cramping.  No bowel change, such as diarrhea, constipation, BRBPR or melana.   Some knee discomfort.  Walking more.  Eating.  Weight stable.             Objective:   Physical Exam  Filed Vitals:   10/25/13 1611  BP: 108/70  Pulse: 73  Temp: 98.5 F (34.64 C)   66 year old female in no acute distress.   HEENT:  Nares- clear.  Oropharynx - without lesions. NECK:  Supple.  Nontender.  No audible bruit.  HEART:  Appears to be regular. LUNGS:  No crackles or wheezing audible.  Respirations even and unlabored.  RADIAL PULSE:  Equal bilaterally.  ABDOMEN:  Soft, nontender.  Bowel sounds present and normal.  No audible abdominal bruit.     EXTREMITIES:  No increased edema present.  DP pulses palpable and equal bilaterally.       Assessment & Plan:  OSTEOPOROSIS.  Have discussed various treatment options.  Is taking caltrate now.  Going to a strength training class.  Follow.     CERVICAL POLYP.  Referred to gyn.  Did not feel needed removing.    LLQ PAIN. Saw GYN.  Had pelvic ultrasound.  Felt pan not  to be coming from a gyn source.  States ultrasound checked out fine.  Recommended GI w/up.  Colonoscopy as outlined.  Still some intermittent discomfort at times.  No severe tenderness and nothing constant.  Discussed further w/up.  She wants to hold at this time.  Wants to monitor.  Will notify me if persistent problems.    CARDIOVASCULAR.  Has the intermittent fluttering as outlined.  EKG obtained and revealed SR with TWI in I, aVL , v1 and v2.  No chest pain.  ECHO recently revealed normal heart function with mild to moderate TR.  Currently doing well.  Follow.   HEALTH MAINTENANCE.  Physical 04/13/13.  Mammogram 02/10/13 - Birads II.  Colonoscopy as outlined.  Bone density as outlined.    I spent 25 minutes with the patient and more than 50% of the time was spent in consultation regarding the above.

## 2013-10-29 ENCOUNTER — Encounter: Payer: Self-pay | Admitting: Internal Medicine

## 2013-11-08 DIAGNOSIS — D485 Neoplasm of uncertain behavior of skin: Secondary | ICD-10-CM | POA: Diagnosis not present

## 2013-11-08 DIAGNOSIS — D239 Other benign neoplasm of skin, unspecified: Secondary | ICD-10-CM | POA: Diagnosis not present

## 2013-11-08 DIAGNOSIS — L578 Other skin changes due to chronic exposure to nonionizing radiation: Secondary | ICD-10-CM | POA: Diagnosis not present

## 2013-11-08 DIAGNOSIS — L408 Other psoriasis: Secondary | ICD-10-CM | POA: Diagnosis not present

## 2013-11-09 DIAGNOSIS — M1710 Unilateral primary osteoarthritis, unspecified knee: Secondary | ICD-10-CM | POA: Diagnosis not present

## 2013-11-10 ENCOUNTER — Encounter: Payer: Self-pay | Admitting: Internal Medicine

## 2013-11-10 DIAGNOSIS — Z86010 Personal history of colonic polyps: Secondary | ICD-10-CM

## 2014-01-24 DIAGNOSIS — L821 Other seborrheic keratosis: Secondary | ICD-10-CM | POA: Diagnosis not present

## 2014-01-24 DIAGNOSIS — D239 Other benign neoplasm of skin, unspecified: Secondary | ICD-10-CM | POA: Diagnosis not present

## 2014-01-24 DIAGNOSIS — L578 Other skin changes due to chronic exposure to nonionizing radiation: Secondary | ICD-10-CM | POA: Diagnosis not present

## 2014-01-24 DIAGNOSIS — L719 Rosacea, unspecified: Secondary | ICD-10-CM | POA: Diagnosis not present

## 2014-01-24 DIAGNOSIS — L408 Other psoriasis: Secondary | ICD-10-CM | POA: Diagnosis not present

## 2014-01-24 DIAGNOSIS — L738 Other specified follicular disorders: Secondary | ICD-10-CM | POA: Diagnosis not present

## 2014-01-24 DIAGNOSIS — L259 Unspecified contact dermatitis, unspecified cause: Secondary | ICD-10-CM | POA: Diagnosis not present

## 2014-01-24 DIAGNOSIS — D1800 Hemangioma unspecified site: Secondary | ICD-10-CM | POA: Diagnosis not present

## 2014-02-22 ENCOUNTER — Ambulatory Visit: Payer: Medicare Other | Admitting: Internal Medicine

## 2014-03-25 ENCOUNTER — Ambulatory Visit (INDEPENDENT_AMBULATORY_CARE_PROVIDER_SITE_OTHER): Payer: Medicare Other | Admitting: Internal Medicine

## 2014-03-25 ENCOUNTER — Encounter: Payer: Self-pay | Admitting: Internal Medicine

## 2014-03-25 VITALS — BP 102/64 | HR 92 | Temp 98.3°F | Wt 88.8 lb

## 2014-03-25 DIAGNOSIS — L989 Disorder of the skin and subcutaneous tissue, unspecified: Secondary | ICD-10-CM

## 2014-03-25 DIAGNOSIS — Z1239 Encounter for other screening for malignant neoplasm of breast: Secondary | ICD-10-CM

## 2014-03-25 DIAGNOSIS — R209 Unspecified disturbances of skin sensation: Secondary | ICD-10-CM | POA: Diagnosis not present

## 2014-03-25 DIAGNOSIS — Z86010 Personal history of colonic polyps: Secondary | ICD-10-CM | POA: Diagnosis not present

## 2014-03-25 DIAGNOSIS — R634 Abnormal weight loss: Secondary | ICD-10-CM

## 2014-03-25 DIAGNOSIS — M25569 Pain in unspecified knee: Secondary | ICD-10-CM | POA: Diagnosis not present

## 2014-03-25 DIAGNOSIS — E041 Nontoxic single thyroid nodule: Secondary | ICD-10-CM | POA: Diagnosis not present

## 2014-03-25 DIAGNOSIS — R200 Anesthesia of skin: Secondary | ICD-10-CM

## 2014-03-25 DIAGNOSIS — Z13220 Encounter for screening for lipoid disorders: Secondary | ICD-10-CM

## 2014-03-25 NOTE — Progress Notes (Signed)
Pre visit review using our clinic review tool, if applicable. No additional management support is needed unless otherwise documented below in the visit note. 

## 2014-03-25 NOTE — Progress Notes (Signed)
Subjective:    Patient ID: Carol Beard, female    DOB: [birth year], 68 y.o.   MRN: [id number]  HPI 68 year old female with past history of thyroid nodule and weight loss who comes in today for a scheduled follow up.   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  L-S spine -2.5.  Have previously discussed various treatment options.  We discussed this again today.  Discussed the need for vitamin D and weight bearing exercises.   Discussed my desire to start her on a bisphosphonate.  She wants to think about this.  She saw Dr Pryor Ochoa.  Had f/u thyroid ultrasound.  States everything checked out ok.  Planning for f/u in one year from last visit.  Has not been walking as much.  Plans to restart.  No cardiac symptoms with increased activity or exertion.  Weight down from last check.   States she is eating more regular.   Bowels stable.   She also has had problems with her knees.  Has been evaluated previously and told decrease cartilage.  She saw Dr Marry Guan.  Knees better.   She did see Dr Melrose Nakayama.  Had nerve conduction studies.  Found to have mild carpal tunnel.  Recommended a splint.  Had her colonoscopy recently.  Had one 45mm polyp, internal hemorrhoids and a tortuous colon.         Past Medical History  Diagnosis Date  . Environmental allergies   . Thyroid nodule     biopsy x 2 negative    Current Outpatient Prescriptions on File Prior to Visit  Medication Sig Dispense Refill  . Calcium Carbonate (CALTRATE 600 PO) Take by mouth.      Marland Kitchen SALINE NASAL SPRAY NA Place into the nose as needed.       No current facility-administered medications on file prior to visit.    Review of Systems Patient denies any headache, lightheadedness or dizziness.  No sinus or allergy symptoms.  No chest pain, tightness or palpitations.  No increased shortness of breath, cough or congestion.  No nausea or vomiting.  No acid reflux.  No abdominal pain or cramping.  No bowel change,  such as diarrhea, constipation, BRBPR or melana.  Knee better.  Eating.  Weight down some from last check.  Saw Dr Nehemiah Massed.  Had lesion removed left lateral lower leg.  Also diagnosed with psoriasis.  Had whole body survey 2/15.  Ok.               Objective:   Physical Exam  Filed Vitals:   03/25/14 1408  BP: 102/64  Pulse: 92  Temp: 98.3 F (52.64 C)   68 year old female in no acute distress.   HEENT:  Nares- clear.  Oropharynx - without lesions. NECK:  Supple.  Nontender.  No audible bruit.  HEART:  Appears to be regular. LUNGS:  No crackles or wheezing audible.  Respirations even and unlabored.  RADIAL PULSE:  Equal bilaterally.  ABDOMEN:  Soft, nontender.  Bowel sounds present and normal.  No audible abdominal bruit.     EXTREMITIES:  No increased edema present.  DP pulses palpable and equal bilaterally.       Assessment & Plan:  OSTEOPOROSIS.  Have discussed various treatment options.  Is taking caltrate now.  Going to a strength training class.  Follow.  Will notify me if agreeable to start a bisphosphonate.     CERVICAL POLYP.  Referred to gyn.  Did not feel needed removing.    LLQ PAIN. Saw GYN.  Had pelvic ultrasound.  Felt pan not to be coming from a gyn source.  States ultrasound checked out fine.  Recommended GI w/up.  Colonoscopy as outlined.  Still some intermittent discomfort at times.  No severe tenderness and nothing constant.  Discussed further w/up.  Discussed possible CT.  She wants to hold at this time.  Wants to monitor.  Will notify me if persistent problems.    CARDIOVASCULAR.  Currently asymptomatic.   ECHO recently revealed normal heart function with mild to moderate TR.  Currently doing well.  Follow.   HEALTH MAINTENANCE.  Physical 04/13/13.  Mammogram 02/10/13 - Birads II.  Schedule f/u mammogram.  Colonoscopy as outlined.  Bone density as outlined.    I spent 25 minutes with the patient and more than 50% of the time was spent in consultation regarding the  above.

## 2014-03-28 ENCOUNTER — Encounter: Payer: Self-pay | Admitting: Internal Medicine

## 2014-03-28 DIAGNOSIS — R634 Abnormal weight loss: Secondary | ICD-10-CM | POA: Insufficient documentation

## 2014-03-28 NOTE — Assessment & Plan Note (Signed)
Has had two biopsies.  Negative.  Had follow up thyroid ultrasound 09/08/13.  States everything checked out fine.  Recommended f/u with ENT in one year.  Followed by Dr Vaught.     

## 2014-03-28 NOTE — Assessment & Plan Note (Signed)
Referred to Dr Nehemiah Massed for evaluation.  S/p removal.  Continues f/u with Dr Nehemiah Massed.

## 2014-03-28 NOTE — Assessment & Plan Note (Signed)
Better s/p injection.  Follow.  

## 2014-03-28 NOTE — Assessment & Plan Note (Signed)
Encourage increased po intake.  Feels good.  Check cbc, met c and tsh.

## 2014-03-28 NOTE — Assessment & Plan Note (Signed)
Had nerve conduction studies that revealed mild carpal tunnel.  Recommended a splint.  Follow.    

## 2014-03-28 NOTE — Assessment & Plan Note (Signed)
Colonoscopy 10/21/13 revealed one 48mm polyp, internal hemorrhoids and a tortuous colon.

## 2014-03-29 ENCOUNTER — Encounter: Payer: Self-pay | Admitting: Emergency Medicine

## 2014-04-01 ENCOUNTER — Other Ambulatory Visit (INDEPENDENT_AMBULATORY_CARE_PROVIDER_SITE_OTHER): Payer: Medicare Other

## 2014-04-01 DIAGNOSIS — R634 Abnormal weight loss: Secondary | ICD-10-CM

## 2014-04-01 LAB — CBC WITH DIFFERENTIAL/PLATELET
Basophils Absolute: 0.1 10*3/uL (ref 0.0–0.1)
Basophils Relative: 1.1 % (ref 0.0–3.0)
EOS PCT: 1.9 % (ref 0.0–5.0)
Eosinophils Absolute: 0.1 10*3/uL (ref 0.0–0.7)
HEMATOCRIT: 40 % (ref 36.0–46.0)
Hemoglobin: 13.6 g/dL (ref 12.0–15.0)
LYMPHS ABS: 1.6 10*3/uL (ref 0.7–4.0)
Lymphocytes Relative: 24.4 % (ref 12.0–46.0)
MCHC: 33.9 g/dL (ref 30.0–36.0)
MCV: 94.8 fl (ref 78.0–100.0)
MONOS PCT: 7.5 % (ref 3.0–12.0)
Monocytes Absolute: 0.5 10*3/uL (ref 0.1–1.0)
NEUTROS ABS: 4.1 10*3/uL (ref 1.4–7.7)
Neutrophils Relative %: 65.1 % (ref 43.0–77.0)
Platelets: 214 10*3/uL (ref 150.0–400.0)
RBC: 4.23 Mil/uL (ref 3.87–5.11)
RDW: 12.9 % (ref 11.5–14.6)
WBC: 6.4 10*3/uL (ref 4.5–10.5)

## 2014-04-01 LAB — COMPREHENSIVE METABOLIC PANEL
ALT: 27 U/L (ref 0–35)
AST: 26 U/L (ref 0–37)
Albumin: 4 g/dL (ref 3.5–5.2)
Alkaline Phosphatase: 66 U/L (ref 39–117)
BUN: 15 mg/dL (ref 6–23)
CALCIUM: 9.6 mg/dL (ref 8.4–10.5)
CO2: 27 meq/L (ref 19–32)
CREATININE: 1 mg/dL (ref 0.4–1.2)
Chloride: 103 mEq/L (ref 96–112)
GFR: 56.79 mL/min — AB (ref >60.00)
GLUCOSE: 93 mg/dL (ref 70–99)
Potassium: 3.7 mEq/L (ref 3.5–5.1)
SODIUM: 140 meq/L (ref 135–145)
TOTAL PROTEIN: 7.3 g/dL (ref 6.0–8.3)
Total Bilirubin: 0.9 mg/dL (ref 0.3–1.2)

## 2014-04-01 LAB — TSH: TSH: 1.79 u[IU]/mL (ref 0.35–5.50)

## 2014-04-04 ENCOUNTER — Encounter: Payer: Self-pay | Admitting: *Deleted

## 2014-06-06 ENCOUNTER — Encounter: Payer: Self-pay | Admitting: *Deleted

## 2014-06-06 ENCOUNTER — Ambulatory Visit: Payer: Self-pay | Admitting: Internal Medicine

## 2014-06-06 DIAGNOSIS — Z1231 Encounter for screening mammogram for malignant neoplasm of breast: Secondary | ICD-10-CM | POA: Diagnosis not present

## 2014-06-06 LAB — HM MAMMOGRAPHY: HM Mammogram: NEGATIVE

## 2014-07-29 ENCOUNTER — Ambulatory Visit (INDEPENDENT_AMBULATORY_CARE_PROVIDER_SITE_OTHER): Payer: Medicare Other | Admitting: Internal Medicine

## 2014-07-29 ENCOUNTER — Encounter: Payer: Self-pay | Admitting: Internal Medicine

## 2014-07-29 VITALS — BP 120/78 | HR 73 | Temp 99.2°F | Ht 63.0 in | Wt 88.5 lb

## 2014-07-29 DIAGNOSIS — E041 Nontoxic single thyroid nodule: Secondary | ICD-10-CM | POA: Diagnosis not present

## 2014-07-29 DIAGNOSIS — R209 Unspecified disturbances of skin sensation: Secondary | ICD-10-CM

## 2014-07-29 DIAGNOSIS — M25569 Pain in unspecified knee: Secondary | ICD-10-CM | POA: Diagnosis not present

## 2014-07-29 DIAGNOSIS — Z86010 Personal history of colonic polyps: Secondary | ICD-10-CM | POA: Diagnosis not present

## 2014-07-29 DIAGNOSIS — R634 Abnormal weight loss: Secondary | ICD-10-CM

## 2014-07-29 DIAGNOSIS — R200 Anesthesia of skin: Secondary | ICD-10-CM

## 2014-07-29 DIAGNOSIS — B351 Tinea unguium: Secondary | ICD-10-CM

## 2014-07-29 NOTE — Progress Notes (Signed)
Pre visit review using our clinic review tool, if applicable. No additional management support is needed unless otherwise documented below in the visit note. 

## 2014-07-31 ENCOUNTER — Encounter: Payer: Self-pay | Admitting: Internal Medicine

## 2014-07-31 DIAGNOSIS — B351 Tinea unguium: Secondary | ICD-10-CM | POA: Insufficient documentation

## 2014-07-31 NOTE — Assessment & Plan Note (Signed)
Discussed referral to podiatry and/or dermatology.

## 2014-07-31 NOTE — Assessment & Plan Note (Signed)
Had nerve conduction studies that revealed mild carpal tunnel.  Recommended a splint.  Follow.

## 2014-07-31 NOTE — Assessment & Plan Note (Signed)
Has had two biopsies.  Negative.  Had follow up thyroid ultrasound 09/08/13.  States everything checked out fine.  Recommended f/u with ENT in one year.  Followed by Dr Pryor Ochoa.

## 2014-07-31 NOTE — Assessment & Plan Note (Signed)
Better s/p injection.  Follow.

## 2014-07-31 NOTE — Progress Notes (Signed)
Subjective:    Patient ID: Carol Beard, female    DOB: 1946/09/28, 68 y.o.   MRN: [id number]  HPI 68 year old female with past history of thyroid nodule and weight loss who comes in today for a scheduled follow up.   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  L-S spine -2.5.  Have discussed treatment options with her.  See last note for details.  She saw Dr Pryor Ochoa.  Had f/u thyroid ultrasound.  States everything checked out ok.  Planning for f/u in one year from last visit.  Has been walking.  No cardiac symptoms with increased activity or exertion.  Weight stable from last check.   States she is eating more regular.  We discussed nutritional supplements.   Bowels stable.   She also has had problems with her knees.  Has been evaluated previously and told decrease cartilage.  She saw Dr Marry Guan.  Knees better.   She did see Dr Melrose Nakayama.  Had nerve conduction studies.  Found to have mild carpal tunnel.  Recommended a splint.  Had her colonoscopy recently.  Had one 45mm polyp, internal hemorrhoids and a tortuous colon.  Reported some previous sinus congestion and drainage.  No cough.  No sob. She had questions about her toenail.  Thickened nail.        Past Medical History  Diagnosis Date  . Environmental allergies   . Thyroid nodule     biopsy x 2 negative    Current Outpatient Prescriptions on File Prior to Visit  Medication Sig Dispense Refill  . Calcium Carbonate (CALTRATE 600 PO) Take by mouth.      Marland Kitchen SALINE NASAL SPRAY NA Place into the nose as needed.       No current facility-administered medications on file prior to visit.    Review of Systems Patient denies any headache, lightheadedness or dizziness.  Some sinus congestion and drainage previously.   No chest pain, tightness or palpitations.  No increased shortness of breath, cough or congestion.  No nausea or vomiting.  No acid reflux.  No abdominal pain or cramping.  No bowel change, such as  diarrhea, constipation, BRBPR or melana.  Knee better.  Eating.  Weight stable from last check.  Saw Dr Nehemiah Massed.  Diagnosed with psoriasis.  Toenail thickening.               Objective:   Physical Exam  Filed Vitals:   07/29/14 1123  BP: 120/78  Pulse: 73  Temp: 99.2 F (37.3 C)   Blood pressure recheck:  44/43  68 year old female in no acute distress.   HEENT:  Nares- clear.  Oropharynx - without lesions. NECK:  Supple.  Nontender.  No audible bruit.  HEART:  Appears to be regular. LUNGS:  No crackles or wheezing audible.  Respirations even and unlabored.  RADIAL PULSE:  Equal bilaterally.  ABDOMEN:  Soft, nontender.  Bowel sounds present and normal.  No audible abdominal bruit.     EXTREMITIES:  No increased edema present.  DP pulses palpable and equal bilaterally.   FEET:  Toenail thickening - great toe.  No increased erythema.      Assessment & Plan:  OSTEOPOROSIS.  Have discussed various treatment options.  Is taking caltrate now.  Follow.  Will notify me if agreeable to start a bisphosphonate.     CERVICAL POLYP.  Referred to gyn.  Did not feel needed removing.    LLQ PAIN. Saw  GYN.  Had pelvic ultrasound.  Felt pan not to be coming from a gyn source.  States ultrasound checked out fine.  Recommended GI w/up.  Colonoscopy as outlined.  Still some intermittent discomfort at times.  No severe tenderness and nothing constant.  Have discussed further w/up.  Discussed possible CT.  She wanted to hold at this time. No problems reported today.    CARDIOVASCULAR.  Currently asymptomatic.   ECHO recently revealed normal heart function with mild to moderate TR. Currently doing well.  Follow.   HEALTH MAINTENANCE.  Physical 04/13/13.  Mammogram 06/06/14 - Birads I.   Colonoscopy as outlined.  Bone density as outlined.    I spent 25 minutes with the patient and more than 50% of the time was spent in consultation regarding the above.

## 2014-07-31 NOTE — Assessment & Plan Note (Signed)
Colonoscopy 10/21/13 revealed one 74mm polyp, internal hemorrhoids and a tortuous colon.   Recommended f/u colonoscopy in 10/2018.

## 2014-07-31 NOTE — Assessment & Plan Note (Signed)
Weight stable from last check.  Discussed nutritional supplements and continuing three meals per day.

## 2014-08-16 DIAGNOSIS — H01009 Unspecified blepharitis unspecified eye, unspecified eyelid: Secondary | ICD-10-CM | POA: Diagnosis not present

## 2014-08-24 DIAGNOSIS — H01009 Unspecified blepharitis unspecified eye, unspecified eyelid: Secondary | ICD-10-CM | POA: Diagnosis not present

## 2014-09-14 ENCOUNTER — Telehealth: Payer: Self-pay

## 2014-09-14 NOTE — Telephone Encounter (Signed)
The patient called and stated she is having difficulty with her weight and is hoping to speak with the nurse or with the doctor regarding this issue.   Pt's callback - [phone]

## 2014-09-14 NOTE — Telephone Encounter (Signed)
Pt states that last week when she weighed, she was 80lbs 4oz. The next time it was 80lbs 35oz. Her normal weight is around 85lbs. Her concern is that she is adding additional foods & her forehead warns her when she needs to eat something (forehead hurts). Goes away after she eats. Wondered if her BP could be affected as well. When asked if she has had it checked, she stated that her friend checked it with her kit but cant remember what the reading was. She also states that she has had no other symptoms that would make her think that there is an issue with her BP. Please advise

## 2014-09-14 NOTE — Telephone Encounter (Signed)
Duplicate.  See my message.

## 2014-09-14 NOTE — Telephone Encounter (Signed)
The 11:51 documentation is not the correct pt.  With Ms Carol Beard, I have discussed her continued weight loss with her.  She has not had an issue with her blood pressure being elevated.  Per my checks - bp has been good.  If she has a friend that can check her pressure, I do recommend her checking this on a few separate occasions and record.  Can call or send in to [place].  I have talked with her about eating regularly and not going long periods without eating.  I feel the head hurting is more related to decrease po intake.  She states it goes away with eating.  If she feel needs evaluated, can schedule an appt.  Will need to be 30 minute appt

## 2014-09-14 NOTE — Telephone Encounter (Signed)
Need info.  She was admitted Monday.  Nurse had said she was doing better.  (had mentioned rehab).  Not sure if this is the plan.

## 2014-09-14 NOTE — Telephone Encounter (Signed)
Are you this is the right patient?

## 2014-09-15 NOTE — Telephone Encounter (Signed)
Pt.notified

## 2014-09-23 DIAGNOSIS — H2513 Age-related nuclear cataract, bilateral: Secondary | ICD-10-CM | POA: Diagnosis not present

## 2014-11-04 ENCOUNTER — Telehealth: Payer: Self-pay | Admitting: Internal Medicine

## 2014-11-04 NOTE — Telephone Encounter (Signed)
left msg to call office to reschedule appt 11/20.msn

## 2014-11-21 ENCOUNTER — Encounter: Payer: Self-pay | Admitting: Internal Medicine

## 2014-11-21 ENCOUNTER — Ambulatory Visit (INDEPENDENT_AMBULATORY_CARE_PROVIDER_SITE_OTHER): Payer: Medicare Other | Admitting: Internal Medicine

## 2014-11-21 VITALS — BP 110/70 | HR 69 | Temp 98.4°F | Ht 62.0 in | Wt 88.5 lb

## 2014-11-21 DIAGNOSIS — Z13220 Encounter for screening for lipoid disorders: Secondary | ICD-10-CM

## 2014-11-21 DIAGNOSIS — R634 Abnormal weight loss: Secondary | ICD-10-CM

## 2014-11-21 DIAGNOSIS — Z86010 Personal history of colonic polyps: Secondary | ICD-10-CM | POA: Diagnosis not present

## 2014-11-21 DIAGNOSIS — E041 Nontoxic single thyroid nodule: Secondary | ICD-10-CM | POA: Diagnosis not present

## 2014-11-21 DIAGNOSIS — M810 Age-related osteoporosis without current pathological fracture: Secondary | ICD-10-CM

## 2014-11-21 DIAGNOSIS — N6489 Other specified disorders of breast: Secondary | ICD-10-CM

## 2014-11-21 NOTE — Progress Notes (Signed)
Pre visit review using our clinic review tool, if applicable. No additional management support is needed unless otherwise documented below in the visit note. 

## 2014-11-27 ENCOUNTER — Encounter: Payer: Self-pay | Admitting: Internal Medicine

## 2014-11-27 NOTE — Progress Notes (Signed)
Subjective:    Patient ID: Carol Beard, female    DOB: 1945/12/17, 68 y.o.   MRN: [id number]  HPI 68 year old female with past history of thyroid nodule and weight loss who comes in today to follow up on these issues as well as for a complete physical exam.  She saw Dr Pryor Ochoa.  Had f/u thyroid ultrasound.  Everything checked out ok.  Planning for f/u in one year from last visit.  Has been walking.  Not as much.  No cardiac symptoms with increased activity or exertion.  Weight stable from last check.   States she is eating more regular.  Bowels stable.   She also has had problems with her knees.  Has been evaluated previously and told decrease cartilage.  She saw Dr Marry Guan.  Knees better.   She did see Dr Melrose Nakayama.  Had nerve conduction studies.  Found to have mild carpal tunnel.  Recommended a splint.  Had her colonoscopy recently.  Had one 10mm polyp, internal hemorrhoids and a tortuous colon.  No cough or congestion.   No sob.  Overall feels better and feels she is doing well.         Past Medical History  Diagnosis Date  . Environmental allergies   . Thyroid nodule     biopsy x 2 negative    Current Outpatient Prescriptions on File Prior to Visit  Medication Sig Dispense Refill  . Calcium Carbonate (CALTRATE 600 PO) Take by mouth.    Marland Kitchen SALINE NASAL SPRAY NA Place into the nose as needed.     No current facility-administered medications on file prior to visit.    Review of Systems Patient denies any headache, lightheadedness or dizziness.  No sinus congestion or drainage.   No chest pain, tightness or palpitations.  No increased shortness of breath, cough or congestion.  No nausea or vomiting.  No acid reflux.  No abdominal pain or cramping.  No bowel change, such as diarrhea, constipation, BRBPR or melana.  Knee better.  Eating better.   Weight stable from last check.  Overall feels she is doing relatively well.                Objective:   Physical Exam  Filed Vitals:   11/21/14  1543  BP: 110/70  Pulse: 69  Temp: 98.4 F (57.52 C)   68 year old female in no acute distress.   HEENT:  Nares- clear.  Oropharynx - without lesions. NECK:  Supple.  Nontender.  No audible bruit.  HEART:  Appears to be regular. LUNGS:  No crackles or wheezing audible.  Respirations even and unlabored.  RADIAL PULSE:  Equal bilaterally.    BREASTS:  No nipple discharge or nipple retraction present.  Could not appreciate any distinct nodules or axillary adenopathy.  ABDOMEN:  Soft, nontender.  Bowel sounds present and normal.  No audible abdominal bruit.  GU:  Not performed.    EXTREMITIES:  No increased edema present.  DP pulses palpable and equal bilaterally.          Assessment & Plan:  Thyroid nodule Has had two biopsies.  Negative.  Had f/u thyroid ultrasound 09/08/13.  States everything checked out fine.  Continue to f/u with Dr Pryor Ochoa.    Fullness of breast Mammogram 06/06/14 - Birads I.   History of colonic polyps 10/22/13 colonoscopy - tubular adenomatous polyp.  Recommended f/u colonoscopy 10/2018.    Weight loss Stable from last check.  Follow.  Eating  better.  Follow.    OSTEOPOROSIS.  Have discussed various treatment options.  Is taking caltrate now.  Follow.  Will notify me if agreeable to start a bisphosphonate.     CERVICAL POLYP.  Referred to gyn.  Did not feel needed removing.    LLQ PAIN. Saw GYN.  Had pelvic ultrasound.  Felt pain not to be coming from a gyn source.  States ultrasound checked out fine.  Recommended GI w/up.  Colonoscopy as outlined.  No abdominal pain reported.     CARDIOVASCULAR.  Currently asymptomatic.   ECHO recently revealed normal heart function with mild to moderate TR. Currently doing well.  Follow.   HEALTH MAINTENANCE.  Physical today.   Mammogram 06/06/14 - Birads I.   Colonoscopy as outlined.  Bone density as outlined.    I spent 25 minutes with the patient and more than 50% of the time was spent in consultation regarding the above.

## 2014-11-28 ENCOUNTER — Encounter: Payer: No Typology Code available for payment source | Admitting: Internal Medicine

## 2014-11-28 ENCOUNTER — Other Ambulatory Visit (INDEPENDENT_AMBULATORY_CARE_PROVIDER_SITE_OTHER): Payer: Medicare Other

## 2014-11-28 DIAGNOSIS — M810 Age-related osteoporosis without current pathological fracture: Secondary | ICD-10-CM

## 2014-11-28 DIAGNOSIS — E041 Nontoxic single thyroid nodule: Secondary | ICD-10-CM

## 2014-11-28 DIAGNOSIS — R634 Abnormal weight loss: Secondary | ICD-10-CM

## 2014-11-28 LAB — COMPREHENSIVE METABOLIC PANEL
ALBUMIN: 4.2 g/dL (ref 3.5–5.2)
ALK PHOS: 75 U/L (ref 39–117)
ALT: 18 U/L (ref 0–35)
AST: 23 U/L (ref 0–37)
BILIRUBIN TOTAL: 0.8 mg/dL (ref 0.2–1.2)
BUN: 14 mg/dL (ref 6–23)
CO2: 27 mEq/L (ref 19–32)
Calcium: 9.4 mg/dL (ref 8.4–10.5)
Chloride: 102 mEq/L (ref 96–112)
Creatinine, Ser: 0.9 mg/dL (ref 0.4–1.2)
GFR: 68.87 mL/min (ref >60.00)
Glucose, Bld: 84 mg/dL (ref 70–99)
Potassium: 4.1 mEq/L (ref 3.5–5.1)
Sodium: 137 mEq/L (ref 135–145)
Total Protein: 7.2 g/dL (ref 6.0–8.3)

## 2014-11-28 LAB — VITAMIN D 25 HYDROXY (VIT D DEFICIENCY, FRACTURES): VITD: 34.2 ng/mL (ref 30.00–100.00)

## 2014-11-28 LAB — TSH: TSH: 1.85 u[IU]/mL (ref 0.35–4.50)

## 2014-11-29 ENCOUNTER — Encounter: Payer: Self-pay | Admitting: *Deleted

## 2014-12-02 ENCOUNTER — Telehealth: Payer: Self-pay | Admitting: *Deleted

## 2014-12-02 NOTE — Telephone Encounter (Signed)
Pt called to report that she has received her lab results & noticed that her cholesterol was not checked. She stated that Dr. Nicki Reaper wanted to check it at her next lab draw. Please let me know if she needs to schedule another lab appt.

## 2014-12-02 NOTE — Telephone Encounter (Signed)
Pt.notified

## 2014-12-02 NOTE — Telephone Encounter (Signed)
Her insurance would not cover this without her signing or ok'ing, so I did not check it.  If she wants it check she may have to pay for the lab.

## 2015-03-08 DIAGNOSIS — L718 Other rosacea: Secondary | ICD-10-CM | POA: Diagnosis not present

## 2015-03-08 DIAGNOSIS — L853 Xerosis cutis: Secondary | ICD-10-CM | POA: Diagnosis not present

## 2015-03-08 DIAGNOSIS — L814 Other melanin hyperpigmentation: Secondary | ICD-10-CM | POA: Diagnosis not present

## 2015-03-08 DIAGNOSIS — Z1283 Encounter for screening for malignant neoplasm of skin: Secondary | ICD-10-CM | POA: Diagnosis not present

## 2015-03-08 DIAGNOSIS — D229 Melanocytic nevi, unspecified: Secondary | ICD-10-CM | POA: Diagnosis not present

## 2015-03-08 DIAGNOSIS — D485 Neoplasm of uncertain behavior of skin: Secondary | ICD-10-CM | POA: Diagnosis not present

## 2015-03-08 DIAGNOSIS — L578 Other skin changes due to chronic exposure to nonionizing radiation: Secondary | ICD-10-CM | POA: Diagnosis not present

## 2015-03-08 DIAGNOSIS — L400 Psoriasis vulgaris: Secondary | ICD-10-CM | POA: Diagnosis not present

## 2015-03-08 DIAGNOSIS — D1800 Hemangioma unspecified site: Secondary | ICD-10-CM | POA: Diagnosis not present

## 2015-03-24 DIAGNOSIS — H2513 Age-related nuclear cataract, bilateral: Secondary | ICD-10-CM | POA: Diagnosis not present

## 2015-05-22 ENCOUNTER — Ambulatory Visit: Payer: No Typology Code available for payment source | Admitting: Internal Medicine

## 2015-06-16 ENCOUNTER — Encounter: Payer: Self-pay | Admitting: Internal Medicine

## 2015-06-16 ENCOUNTER — Ambulatory Visit (INDEPENDENT_AMBULATORY_CARE_PROVIDER_SITE_OTHER): Payer: Medicare Other | Admitting: Internal Medicine

## 2015-06-16 VITALS — BP 120/70 | HR 77 | Temp 98.2°F | Ht 62.0 in | Wt 88.1 lb

## 2015-06-16 DIAGNOSIS — Z0000 Encounter for general adult medical examination without abnormal findings: Secondary | ICD-10-CM

## 2015-06-16 DIAGNOSIS — Z86010 Personal history of colonic polyps: Secondary | ICD-10-CM

## 2015-06-16 DIAGNOSIS — E041 Nontoxic single thyroid nodule: Secondary | ICD-10-CM

## 2015-06-16 DIAGNOSIS — Z1239 Encounter for other screening for malignant neoplasm of breast: Secondary | ICD-10-CM

## 2015-06-16 DIAGNOSIS — Z91048 Other nonmedicinal substance allergy status: Secondary | ICD-10-CM

## 2015-06-16 DIAGNOSIS — Z9109 Other allergy status, other than to drugs and biological substances: Secondary | ICD-10-CM

## 2015-06-16 DIAGNOSIS — R634 Abnormal weight loss: Secondary | ICD-10-CM

## 2015-06-16 NOTE — Patient Instructions (Signed)
Saline nasal spray - flush nose at least 2-3x/day.  If this does not work, then  [employer] nasal spray - 2 sprays each nostril one time per day.  Do this in the evening.

## 2015-06-16 NOTE — Progress Notes (Signed)
Patient ID: Carol Beard, female   DOB: March 09, 1946, 69 y.o.   MRN: [id number]   Subjective:    Patient ID: Carol Beard, female    DOB: 1946-10-14, 69 y.o.   MRN: [id number]  HPI  Patient here for a scheduled follow up.  Weight is stable.  She is eating. Tries to stay active.  Some nasal congestion and some drainage.  No sinus pressure.  No fever.  No cough or chest congestion.  No sob.  Bowels stable.  Some increased stress with her friends medical issues.  Does not feel she needs any further intervention.     Past Medical History  Diagnosis Date  . Environmental allergies   . Thyroid nodule     biopsy x 2 negative    Outpatient Encounter Prescriptions as of 06/16/2015  Medication Sig  . Calcium Carbonate (CALTRATE 600 PO) Take by mouth.  . Polyvinyl Alcohol-Povidone (REFRESH OP) Apply to eye.  Marland Kitchen SALINE NASAL SPRAY NA Place into the nose as needed.   No facility-administered encounter medications on file as of 06/16/2015.    Review of Systems  Constitutional: Negative for fever, appetite change and unexpected weight change.  HENT: Positive for congestion and postnasal drip. Negative for sinus pressure.   Respiratory: Negative for cough, chest tightness and shortness of breath.   Cardiovascular: Negative for chest pain, palpitations and leg swelling.  Gastrointestinal: Negative for nausea, vomiting, abdominal pain and diarrhea.  Skin: Negative for color change and rash.  Neurological: Negative for dizziness, light-headedness and headaches.  Hematological: Negative for adenopathy. Does not bruise/bleed easily.  Psychiatric/Behavioral: Negative for dysphoric mood and agitation.       Objective:    Physical Exam  Constitutional: She appears well-developed and well-nourished. No distress.  HENT:  Nose: Nose normal.  Mouth/Throat: Oropharynx is clear and moist.  Neck: Neck supple. No thyromegaly present.  Cardiovascular: Normal rate and regular rhythm.     Pulmonary/Chest: Breath sounds normal. No respiratory distress. She has no wheezes.  Abdominal: Soft. Bowel sounds are normal. There is no tenderness.  Musculoskeletal: She exhibits no edema or tenderness.  Lymphadenopathy:    She has no cervical adenopathy.  Skin: No rash noted. No erythema.  Psychiatric: She has a normal mood and affect. Her behavior is normal.    BP 120/70 mmHg  Pulse 77  Temp(Src) 98.2 F (36.8 C) (Oral)  Ht 5\' 2"  (1.575 m)  Wt 88 lb 2 oz (39.973 kg)  BMI 16.11 kg/m2  SpO2 97% Wt Readings from Last 3 Encounters:  06/16/15 88 lb 2 oz (39.973 kg)  11/21/14 88 lb 8 oz (40.143 kg)  07/29/14 88 lb 8 oz (40.143 kg)     Lab Results  Component Value Date   WBC 6.4 04/01/2014   HGB 13.6 04/01/2014   HCT 40.0 04/01/2014   PLT 214.0 04/01/2014   GLUCOSE 84 11/28/2014   CHOL 180 06/23/2013   TRIG 58.0 06/23/2013   HDL 78.30 06/23/2013   LDLCALC 90 06/23/2013   ALT 18 11/28/2014   AST 23 11/28/2014   NA 137 11/28/2014   K 4.1 11/28/2014   CL 102 11/28/2014   CREATININE 0.9 11/28/2014   BUN 14 11/28/2014   CO2 27 11/28/2014   TSH 1.85 11/28/2014       Assessment & Plan:   Problem List Items Addressed This Visit    Environmental allergies    Saline nasal spray as directed.  nasacort as directed.  Follow.  Health care maintenance    Physical 11/21/14.  Mammogram 06/06/14 - Birads I.  Schedule f/u mammogram.  Colonoscopy 10/22/13.  Recommended f/u colonoscopy 10/2018.        History of colonic polyps    Colonoscopy 10/22/13 - tubular adenomatous polyp.  Recommended f/u colonoscopy in 10/2018.        Thyroid nodule    S/p two biopsies.  Negative.  F/u ultrasound 09/08/13.  States everything checked out fine.  Recommended f/u with ENT.  Need to make sure she has kept f/u.        Weight loss    Weight is stable.  Follow.        Other Visit Diagnoses    Breast cancer screening    -  Primary    Relevant Orders    MM DIGITAL SCREENING  BILATERAL      I spent 25 minutes with the patient and more than 50% of the time was spent in consultation regarding the above.     Einar Pheasant, MD

## 2015-06-16 NOTE — Progress Notes (Signed)
Pre visit review using our clinic review tool, if applicable. No additional management support is needed unless otherwise documented below in the visit note. 

## 2015-06-18 ENCOUNTER — Telehealth: Payer: Self-pay | Admitting: Internal Medicine

## 2015-06-18 ENCOUNTER — Encounter: Payer: Self-pay | Admitting: Internal Medicine

## 2015-06-18 DIAGNOSIS — Z9109 Other allergy status, other than to drugs and biological substances: Secondary | ICD-10-CM | POA: Insufficient documentation

## 2015-06-18 DIAGNOSIS — Z0000 Encounter for general adult medical examination without abnormal findings: Secondary | ICD-10-CM | POA: Insufficient documentation

## 2015-06-18 NOTE — Assessment & Plan Note (Signed)
Saline nasal spray as directed.  nasacort as directed.  Follow.

## 2015-06-18 NOTE — Assessment & Plan Note (Signed)
Weight is stable.  Follow.   

## 2015-06-18 NOTE — Assessment & Plan Note (Addendum)
Physical 11/21/14.  Mammogram 06/06/14 - Birads I.  Schedule f/u mammogram.  Colonoscopy 10/22/13.  Recommended f/u colonoscopy 10/2018.

## 2015-06-18 NOTE — Assessment & Plan Note (Signed)
Colonoscopy 10/22/13 - tubular adenomatous polyp.  Recommended f/u colonoscopy in 10/2018.

## 2015-06-18 NOTE — Telephone Encounter (Signed)
Per our records, pt has seen ENT for thyroid nodule.  Per documentation - they recommended f/u with ENT.  Did she f/u?  Does she plan to f/u with ENT?  Have they released her?

## 2015-06-18 NOTE — Assessment & Plan Note (Signed)
S/p two biopsies.  Negative.  F/u ultrasound 09/08/13.  States everything checked out fine.  Recommended f/u with ENT.  Need to make sure she has kept f/u.

## 2015-06-20 NOTE — Telephone Encounter (Signed)
Pt has not f/u yet, but she does plan to f/u with them.

## 2015-06-20 NOTE — Telephone Encounter (Signed)
Requested last office note first.

## 2015-06-20 NOTE — Telephone Encounter (Signed)
LMTCB (see mammogram appt also)

## 2015-07-03 ENCOUNTER — Ambulatory Visit
Admission: RE | Admit: 2015-07-03 | Discharge: 2015-07-03 | Disposition: A | Discharge status: Home / Self Care | Payer: Medicare Other | Source: Ambulatory Visit | Attending: Internal Medicine | Admitting: Internal Medicine

## 2015-07-03 DIAGNOSIS — Z1239 Encounter for other screening for malignant neoplasm of breast: Secondary | ICD-10-CM

## 2015-07-03 DIAGNOSIS — Z1231 Encounter for screening mammogram for malignant neoplasm of breast: Secondary | ICD-10-CM | POA: Diagnosis not present

## 2015-10-10 ENCOUNTER — Encounter: Payer: Self-pay | Admitting: *Deleted

## 2015-11-03 ENCOUNTER — Encounter: Payer: Self-pay | Admitting: *Deleted

## 2015-11-03 ENCOUNTER — Emergency Department
Admission: EM | Admit: 2015-11-03 | Discharge: 2015-11-03 | Disposition: A | Discharge status: Home / Self Care | Payer: Medicare Other | Attending: Emergency Medicine | Admitting: Emergency Medicine

## 2015-11-03 ENCOUNTER — Emergency Department: Payer: Medicare Other

## 2015-11-03 DIAGNOSIS — R110 Nausea: Secondary | ICD-10-CM | POA: Diagnosis not present

## 2015-11-03 DIAGNOSIS — R519 Headache, unspecified: Secondary | ICD-10-CM

## 2015-11-03 DIAGNOSIS — R51 Headache: Secondary | ICD-10-CM | POA: Diagnosis not present

## 2015-11-03 DIAGNOSIS — Z880 Allergy status to penicillin: Secondary | ICD-10-CM | POA: Diagnosis not present

## 2015-11-03 LAB — CBC WITH DIFFERENTIAL/PLATELET
BASOS ABS: 0 10*3/uL (ref 0–0.1)
BASOS PCT: 1 %
EOS PCT: 0 %
Eosinophils Absolute: 0 10*3/uL (ref 0–0.7)
HCT: 44.4 % (ref 35.0–47.0)
Hemoglobin: 14.7 g/dL (ref 12.0–16.0)
Lymphocytes Relative: 11 %
Lymphs Abs: 0.6 10*3/uL — ABNORMAL LOW (ref 1.0–3.6)
MCH: 31.1 pg (ref 26.0–34.0)
MCHC: 33.1 g/dL (ref 32.0–36.0)
MCV: 94.2 fL (ref 80.0–100.0)
MONOS PCT: 5 %
Monocytes Absolute: 0.3 10*3/uL (ref 0.2–0.9)
Neutro Abs: 4.5 10*3/uL (ref 1.4–6.5)
Neutrophils Relative %: 83 %
PLATELETS: 206 10*3/uL (ref 150–440)
RBC: 4.71 MIL/uL (ref 3.80–5.20)
RDW: 13.6 % (ref 11.5–14.5)
WBC: 5.5 10*3/uL (ref 3.6–11.0)

## 2015-11-03 LAB — COMPREHENSIVE METABOLIC PANEL
ALBUMIN: 4.7 g/dL (ref 3.5–5.0)
ALT: 28 U/L (ref 14–54)
AST: 43 U/L — AB (ref 15–41)
Alkaline Phosphatase: 72 U/L (ref 38–126)
Anion gap: 10 (ref 5–15)
BILIRUBIN TOTAL: 0.6 mg/dL (ref 0.3–1.2)
BUN: 9 mg/dL (ref 6–20)
CO2: 26 mmol/L (ref 22–32)
Calcium: 9.8 mg/dL (ref 8.9–10.3)
Chloride: 103 mmol/L (ref 101–111)
Creatinine, Ser: 0.88 mg/dL (ref 0.44–1.00)
GFR calc Af Amer: >60 mL/min (ref >60)
Glucose, Bld: 106 mg/dL — ABNORMAL HIGH (ref 65–99)
POTASSIUM: 4.2 mmol/L (ref 3.5–5.1)
Sodium: 139 mmol/L (ref 135–145)
TOTAL PROTEIN: 8.3 g/dL — AB (ref 6.5–8.1)

## 2015-11-03 NOTE — ED Provider Notes (Signed)
[facility] Emergency Department Provider Note     Time seen: ----------------------------------------- 3:25 PM on 11/03/2015 -----------------------------------------    I have reviewed the triage vital signs and the nursing notes.   HISTORY  Chief Complaint Headache    HPI Carol Beard is a 69 y.o. female who presents ER for headache for the last week with nausea. Patient states she's not eating well the last week, headache is dull in the frontal scalp area. She has not had headaches prior to this, denies numbness tingling or weakness. Patient denies fevers, chills, chest pain, shortness of breath or other symptoms.   Past Medical History  Diagnosis Date  . Environmental allergies   . Thyroid nodule     biopsy x 2 negative    Patient Active Problem List   Diagnosis Date Noted  . Health care maintenance 06/18/2015  . Environmental allergies 06/18/2015  . Toenail fungus 07/31/2014  . Weight loss 03/28/2014  . History of colonic polyps 10/28/2013  . Knee pain 09/09/2013  . Skin lesion 09/09/2013  . Memory change 06/18/2013  . Hand numbness 06/18/2013  . Fullness of breast 06/16/2013  . Thyroid nodule 01/13/2013    Past Surgical History  Procedure Laterality Date  . Tonsillectomy    . Breast biopsy Left 1971    fibroadenoma, left breast    Allergies Penicillins  Social History Social History  Substance Use Topics  . Smoking status: Never Smoker   . Smokeless tobacco: Never Used  . Alcohol Use: No     Comment: occasional wine with dinner    Review of Systems Constitutional: Negative for fever. Eyes: Negative for visual changes. ENT: Negative for sore throat. Cardiovascular: Negative for chest pain. Respiratory: Negative for shortness of breath. Gastrointestinal: Negative for abdominal pain, positive for nausea, negative for vomiting and diarrhea. Genitourinary: Negative for dysuria. Musculoskeletal: Negative for back  pain. Skin: Negative for rash. Neurological: Positive for headache, negative for weakness or numbness  10-point ROS otherwise negative.  ____________________________________________   PHYSICAL EXAM:  VITAL SIGNS: ED Triage Vitals  Enc Vitals Group     BP 11/03/15 1510 131/73 mmHg     Pulse Rate 11/03/15 1510 78     Resp 11/03/15 1510 20     Temp 11/03/15 1510 98.1 F (36.7 C)     Temp Source 11/03/15 1510 Oral     SpO2 11/03/15 1510 100 %     Weight 11/03/15 1510 85 lb (38.556 kg)     Height 11/03/15 1510 5\' 2"  (1.575 m)     Head Cir --      Peak Flow --      Pain Score --      Pain Loc --      Pain Edu? --      Excl. in [place]? --     Constitutional: Alert and oriented. Well appearing and in no distress. Eyes: Conjunctivae are normal. PERRL. Normal extraocular movements. ENT   Head: Normocephalic and atraumatic.   Nose: No congestion/rhinnorhea.   Mouth/Throat: Mucous membranes are moist.   Neck: No stridor. Cardiovascular: Normal rate, regular rhythm. Normal and symmetric distal pulses are present in all extremities. No murmurs, rubs, or gallops. Respiratory: Normal respiratory effort without tachypnea nor retractions. Breath sounds are clear and equal bilaterally. No wheezes/rales/rhonchi. Gastrointestinal: Soft and nontender. No distention. No abdominal bruits.  Musculoskeletal: Nontender with normal range of motion in all extremities. No joint effusions.  No lower extremity tenderness nor edema. Neurologic:  Normal speech  and language. No gross focal neurologic deficits are appreciated. Speech is normal. No gait instability. Skin:  Skin is warm, dry and intact. Pallor is noted Psychiatric: Mood and affect are normal. Speech and behavior are normal. Patient exhibits appropriate insight and judgment. ____________________________________________  ED COURSE:  Pertinent labs & imaging results that were available during my care of the patient were reviewed by  me and considered in my medical decision making (see chart for details). Nonspecific headache for the last week, we'll obtain CT imaging and basic labs. ____________________________________________    LABS (pertinent positives/negatives)  Labs Reviewed  CBC WITH DIFFERENTIAL/PLATELET - Abnormal; Notable for the following:    Lymphs Abs 0.6 (*)    All other components within normal limits  COMPREHENSIVE METABOLIC PANEL - Abnormal; Notable for the following:    Glucose, Bld 106 (*)    Total Protein 8.3 (*)    AST 43 (*)    All other components within normal limits    RADIOLOGY Images were viewed by me  CT head  IMPRESSION: Normal exam. ____________________________________________  FINAL ASSESSMENT AND PLAN  Headache  Plan: Patient with labs and imaging as dictated above. Patient's labs and head CT are unremarkable. Unclear etiology for headache. Possible stress related, she'll be discharged with instructions to follow-up with her primary care doctor.   Earleen Newport, MD   Earleen Newport, MD 11/03/15 [phone]

## 2015-11-03 NOTE — ED Notes (Addendum)
Pt reports a headache for 1 week with nausea, pt reports not eating in a week, headache is dull with no changes in vision

## 2015-11-03 NOTE — Discharge Instructions (Signed)

## 2015-11-03 NOTE — ED Notes (Signed)
Pt reports Periodontal Exam x2 weeks ago.

## 2015-12-13 ENCOUNTER — Encounter: Payer: Self-pay | Admitting: *Deleted

## 2015-12-22 ENCOUNTER — Encounter: Payer: Self-pay | Admitting: Internal Medicine

## 2015-12-22 ENCOUNTER — Ambulatory Visit (INDEPENDENT_AMBULATORY_CARE_PROVIDER_SITE_OTHER): Payer: Medicare Other | Admitting: Internal Medicine

## 2015-12-22 VITALS — BP 118/70 | HR 86 | Temp 98.1°F | Resp 18 | Ht 62.0 in | Wt 87.8 lb

## 2015-12-22 DIAGNOSIS — R519 Headache, unspecified: Secondary | ICD-10-CM

## 2015-12-22 DIAGNOSIS — N63 Unspecified lump in unspecified breast: Secondary | ICD-10-CM

## 2015-12-22 DIAGNOSIS — R51 Headache: Secondary | ICD-10-CM

## 2015-12-22 DIAGNOSIS — Z860100 Personal history of colon polyps, unspecified: Secondary | ICD-10-CM

## 2015-12-22 DIAGNOSIS — Z13220 Encounter for screening for lipoid disorders: Secondary | ICD-10-CM

## 2015-12-22 DIAGNOSIS — R634 Abnormal weight loss: Secondary | ICD-10-CM | POA: Diagnosis not present

## 2015-12-22 DIAGNOSIS — Z0000 Encounter for general adult medical examination without abnormal findings: Secondary | ICD-10-CM

## 2015-12-22 DIAGNOSIS — E041 Nontoxic single thyroid nodule: Secondary | ICD-10-CM | POA: Diagnosis not present

## 2015-12-22 DIAGNOSIS — R413 Other amnesia: Secondary | ICD-10-CM | POA: Diagnosis not present

## 2015-12-22 NOTE — Progress Notes (Signed)
Patient ID: Carol Beard, female   DOB: [birth year], 70 y.o.   MRN: [id number]   Subjective:    Patient ID: Carol Beard, female    DOB: 06/17/1946, 70 y.o.   MRN: [id number]  HPI  Patient with past history of thyroid nodule, allergies and weight loss.  She comes in today to follow up on these issues as well as for a physical exam.  She has had trouble recently finding her works.  States she knows what she wants to say, but has trouble getting her sentences out.  No actual expressive aphasia.  States she has trouble just getting her words out and remembering what words she wants to say.  She also reports some intermittent dizziness.  Also intermittent headache.  No chest pain or tightness.  No sob.  Discussed her weight.  Discussed increased po intake.  Some abdominal discomfort - intermittently.  Rare occurrence.  Not aware of any trigger.  Bowels stable.  Does not get out much.  Concern regarding her memory.     Past Medical History  Diagnosis Date  . Environmental allergies   . Thyroid nodule     biopsy x 2 negative   Past Surgical History  Procedure Laterality Date  . Tonsillectomy    . Breast biopsy Left 1971    fibroadenoma, left breast   Family History  Problem Relation Age of Onset  . Cancer Mother     Colon  . Stroke Father   . Breast cancer Neg Hx    Social History   Social History  . Marital Status: Married    Spouse Name: N/A  . Number of Children: 0  . Years of Education: N/A   Social History Main Topics  . Smoking status: Never Smoker   . Smokeless tobacco: Never Used  . Alcohol Use: No     Comment: occasional wine with dinner  . Drug Use: No  . Sexual Activity: Not Asked   Other Topics Concern  . None   Social History Narrative    Outpatient Encounter Prescriptions as of 12/22/2015  Medication Sig  . Calcium Carbonate (CALTRATE 600 PO) Take by mouth.  . Polyvinyl Alcohol-Povidone (REFRESH OP) Apply to eye.  Marland Kitchen SALINE NASAL SPRAY NA Place into  the nose as needed.   No facility-administered encounter medications on file as of 12/22/2015.    Review of Systems  Constitutional:       Weight remains low.  Stable from last check.  Discussed her diet.    HENT: Negative for congestion and sinus pressure.   Eyes: Negative for pain and visual disturbance.  Respiratory: Negative for cough, chest tightness and shortness of breath.   Cardiovascular: Negative for chest pain, palpitations and leg swelling.  Gastrointestinal: Positive for abdominal pain (intermittent abdominal discomfort.  rare.  no triggers that she is aware of.  ). Negative for nausea, vomiting and diarrhea.  Genitourinary: Negative for dysuria and difficulty urinating.  Musculoskeletal: Negative for back pain and joint swelling.  Skin: Negative for color change and rash.  Neurological: Positive for dizziness, light-headedness and headaches.       Concern regarding memory change and finding her words as outlined.    Hematological: Negative for adenopathy. Does not bruise/bleed easily.  Psychiatric/Behavioral: Negative for dysphoric mood and agitation.       Objective:    Physical Exam  Constitutional: No distress.  HENT:  Nose: Nose normal.  Mouth/Throat: Oropharynx is clear and moist.  Eyes: Conjunctivae are normal.  Right eye exhibits no discharge. Left eye exhibits no discharge.  Neck: Neck supple. No thyromegaly present.  Cardiovascular: Normal rate and regular rhythm.   Pulmonary/Chest: Breath sounds normal. No respiratory distress. She has no wheezes.  Breast exam - left breast nodule 2-3 o'clock left breast.  No other palpable nodules or axillary adenopathy.  No nipple discharge or nipple retraction present.   Abdominal: Soft. Bowel sounds are normal. There is no tenderness.  Musculoskeletal: She exhibits no edema or tenderness.  Lymphadenopathy:    She has no cervical adenopathy.  Skin: No rash noted. No erythema.  Psychiatric: She has a normal mood and  affect. Her behavior is normal.    BP 118/70 mmHg  Pulse 86  Temp(Src) 98.1 F (36.7 C) (Oral)  Resp 18  Ht 5\' 2"  (1.575 m)  Wt 87 lb 12 oz (39.803 kg)  BMI 16.05 kg/m2  SpO2 98% Wt Readings from Last 3 Encounters:  12/22/15 87 lb 12 oz (39.803 kg)  11/03/15 85 lb (38.556 kg)  06/16/15 88 lb 2 oz (39.973 kg)     Lab Results  Component Value Date   WBC 5.5 11/03/2015   HGB 14.7 11/03/2015   HCT 44.4 11/03/2015   PLT 206 11/03/2015   GLUCOSE 106* 11/03/2015   CHOL 180 06/23/2013   TRIG 58.0 06/23/2013   HDL 78.30 06/23/2013   LDLCALC 90 06/23/2013   ALT 28 11/03/2015   AST 43* 11/03/2015   NA 139 11/03/2015   K 4.2 11/03/2015   CL 103 11/03/2015   CREATININE 0.88 11/03/2015   BUN 9 11/03/2015   CO2 26 11/03/2015   TSH 1.85 11/28/2014    Ct Head Wo Contrast  11/03/2015  CLINICAL DATA:  Headache with nausea. EXAM: CT HEAD WITHOUT CONTRAST TECHNIQUE: Contiguous axial images were obtained from the base of the skull through the vertex without intravenous contrast. COMPARISON:  None. FINDINGS: No mass lesion. No midline shift. No acute hemorrhage or hematoma. No extra-axial fluid collections. No evidence of acute infarction. Brain parenchyma is normal. Osseous structures are normal. IMPRESSION: Normal exam. Electronically Signed   By: Lorriane Shire M.D.   On: 11/03/2015 15:51       Assessment & Plan:   Problem List Items Addressed This Visit    Breast nodule    Schedule left breast mammogram and ultrasound.  Exam as outlined.        Headache    Persistent intermittent headache with dizziness and memory concerns.  Check MRI brain.        Health care maintenance    Physical today 12/22/15.  Schedule left breast mammogram as outlined.  Bilateral mammogram 07/03/15 - Birads I.  Colonoscopy 10/2013.  Recommended f/u colonoscopy in 2019.        History of colonic polyps    Colonoscopy 10/22/13 - tubular adenomatous polyp.  Recommended f/u colonoscopy in 10/2018.          Memory change    Had tsh wnl.  Recheck labs.  Had the intermittent headaches, dizziness and memory change as outlined.  Check MRI brain.  Further w/up pending.       Relevant Orders   Vitamin B12   Thyroid nodule - Primary    S/p two biopsies.  Negative.  Was seeing ENT.       Relevant Orders   TSH   Weight loss    Relatively stable from last check.  Discussed increased po intake.  Follow.       Relevant  Orders   CBC with Differential/Platelet   Hepatic function panel   Basic metabolic panel    Other Visit Diagnoses    Screening cholesterol level          I spent 40 minutes with the patient and more than 50% of the time was spent in consultation regarding the above.     Einar Pheasant, MD

## 2015-12-22 NOTE — Progress Notes (Signed)
Pre-visit discussion using our clinic review tool. No additional management support is needed unless otherwise documented below in the visit note.  

## 2015-12-24 ENCOUNTER — Encounter: Payer: Self-pay | Admitting: Internal Medicine

## 2015-12-24 DIAGNOSIS — R51 Headache: Secondary | ICD-10-CM

## 2015-12-24 DIAGNOSIS — N63 Unspecified lump in unspecified breast: Secondary | ICD-10-CM | POA: Insufficient documentation

## 2015-12-24 DIAGNOSIS — R519 Headache, unspecified: Secondary | ICD-10-CM | POA: Insufficient documentation

## 2015-12-24 NOTE — Assessment & Plan Note (Signed)
Had tsh wnl.  Recheck labs.  Had the intermittent headaches, dizziness and memory change as outlined.  Check MRI brain.  Further w/up pending.

## 2015-12-24 NOTE — Assessment & Plan Note (Signed)
Colonoscopy 10/22/13 - tubular adenomatous polyp.  Recommended f/u colonoscopy in 10/2018.   

## 2015-12-24 NOTE — Assessment & Plan Note (Signed)
S/p two biopsies.  Negative.  Was seeing ENT.

## 2015-12-24 NOTE — Assessment & Plan Note (Signed)
Physical today 12/22/15.  Schedule left breast mammogram as outlined.  Bilateral mammogram 07/03/15 - Birads I.  Colonoscopy 10/2013.  Recommended f/u colonoscopy in 2019.

## 2015-12-24 NOTE — Assessment & Plan Note (Signed)
Persistent intermittent headache with dizziness and memory concerns.  Check MRI brain.

## 2015-12-24 NOTE — Assessment & Plan Note (Signed)
Relatively stable from last check.  Discussed increased po intake.  Follow.

## 2015-12-24 NOTE — Assessment & Plan Note (Signed)
Schedule left breast mammogram and ultrasound.  Exam as outlined.

## 2016-01-03 ENCOUNTER — Telehealth: Payer: Self-pay | Admitting: Internal Medicine

## 2016-01-05 ENCOUNTER — Other Ambulatory Visit (INDEPENDENT_AMBULATORY_CARE_PROVIDER_SITE_OTHER): Payer: Medicare Other

## 2016-01-05 ENCOUNTER — Telehealth: Payer: Self-pay | Admitting: Internal Medicine

## 2016-01-05 DIAGNOSIS — R413 Other amnesia: Secondary | ICD-10-CM | POA: Diagnosis not present

## 2016-01-05 DIAGNOSIS — R634 Abnormal weight loss: Secondary | ICD-10-CM | POA: Diagnosis not present

## 2016-01-05 DIAGNOSIS — E041 Nontoxic single thyroid nodule: Secondary | ICD-10-CM | POA: Diagnosis not present

## 2016-01-05 LAB — BASIC METABOLIC PANEL
BUN: 27 mg/dL — ABNORMAL HIGH (ref 6–23)
CALCIUM: 9.9 mg/dL (ref 8.4–10.5)
CO2: 26 meq/L (ref 19–32)
CREATININE: 1.08 mg/dL (ref 0.40–1.20)
Chloride: 102 mEq/L (ref 96–112)
GFR: 53.49 mL/min — AB (ref >60.00)
Glucose, Bld: 97 mg/dL (ref 70–99)
Potassium: 4 mEq/L (ref 3.5–5.1)
SODIUM: 141 meq/L (ref 135–145)

## 2016-01-05 LAB — CBC WITH DIFFERENTIAL/PLATELET
BASOS PCT: 0.7 % (ref 0.0–3.0)
Basophils Absolute: 0.1 10*3/uL (ref 0.0–0.1)
EOS ABS: 0 10*3/uL (ref 0.0–0.7)
EOS PCT: 0.4 % (ref 0.0–5.0)
HEMATOCRIT: 41.9 % (ref 36.0–46.0)
Hemoglobin: 14.1 g/dL (ref 12.0–15.0)
LYMPHS PCT: 23.7 % (ref 12.0–46.0)
Lymphs Abs: 1.7 10*3/uL (ref 0.7–4.0)
MCHC: 33.8 g/dL (ref 30.0–36.0)
MCV: 94.3 fl (ref 78.0–100.0)
MONO ABS: 0.4 10*3/uL (ref 0.1–1.0)
Monocytes Relative: 5.7 % (ref 3.0–12.0)
NEUTROS ABS: 4.9 10*3/uL (ref 1.4–7.7)
Neutrophils Relative %: 69.5 % (ref 43.0–77.0)
PLATELETS: 197 10*3/uL (ref 150.0–400.0)
RBC: 4.44 Mil/uL (ref 3.87–5.11)
RDW: 13.4 % (ref 11.5–15.5)
WBC: 7 10*3/uL (ref 4.0–10.5)

## 2016-01-05 LAB — HEPATIC FUNCTION PANEL
ALBUMIN: 4.8 g/dL (ref 3.5–5.2)
ALT: 16 U/L (ref 0–35)
AST: 21 U/L (ref 0–37)
Alkaline Phosphatase: 73 U/L (ref 39–117)
BILIRUBIN TOTAL: 1 mg/dL (ref 0.2–1.2)
Bilirubin, Direct: 0.2 mg/dL (ref 0.0–0.3)
Total Protein: 7.9 g/dL (ref 6.0–8.3)

## 2016-01-05 LAB — TSH: TSH: 1.75 u[IU]/mL (ref 0.35–4.50)

## 2016-01-05 LAB — VITAMIN B12: VITAMIN B 12: 305 pg/mL (ref 211–911)

## 2016-01-05 NOTE — Telephone Encounter (Signed)
Pt wanted to check on the status of her MRI.

## 2016-01-08 ENCOUNTER — Encounter: Payer: Self-pay | Admitting: *Deleted

## 2016-01-09 NOTE — Telephone Encounter (Signed)
Patient stated that she was to have a MRI,she do not have a order. She was requesting some one to call her to explain the MRI process to her.  Contact  [phone]

## 2016-02-09 ENCOUNTER — Ambulatory Visit: Payer: No Typology Code available for payment source | Admitting: Internal Medicine

## 2016-03-19 ENCOUNTER — Ambulatory Visit: Payer: No Typology Code available for payment source | Admitting: Internal Medicine

## 2016-08-30 ENCOUNTER — Ambulatory Visit: Payer: No Typology Code available for payment source | Admitting: Internal Medicine

## 2016-08-30 ENCOUNTER — Encounter: Payer: Self-pay | Admitting: Internal Medicine

## 2016-08-30 ENCOUNTER — Ambulatory Visit (INDEPENDENT_AMBULATORY_CARE_PROVIDER_SITE_OTHER): Payer: Medicare Other | Admitting: Internal Medicine

## 2016-08-30 VITALS — BP 102/80 | HR 74 | Temp 99.0°F | Ht 62.0 in | Wt 89.0 lb

## 2016-08-30 DIAGNOSIS — R413 Other amnesia: Secondary | ICD-10-CM

## 2016-08-30 DIAGNOSIS — R4701 Aphasia: Secondary | ICD-10-CM

## 2016-08-30 DIAGNOSIS — Z13220 Encounter for screening for lipoid disorders: Secondary | ICD-10-CM

## 2016-08-30 DIAGNOSIS — R634 Abnormal weight loss: Secondary | ICD-10-CM | POA: Diagnosis not present

## 2016-08-30 DIAGNOSIS — E041 Nontoxic single thyroid nodule: Secondary | ICD-10-CM

## 2016-08-30 NOTE — Progress Notes (Signed)
Pre visit review using our clinic review tool, if applicable. No additional management support is needed unless otherwise documented below in the visit note. 

## 2016-08-30 NOTE — Progress Notes (Signed)
Patient ID: Carol Beard, female   DOB: 03/16/1946, 70 y.o.   MRN: [id number]   Subjective:    Patient ID: Carol Beard, female    DOB: Oct 25, 1946, 70 y.o.   MRN: [id number]  HPI  Patient here for a scheduled follow up.  I have not seen her in a year.  She reports that she is having increased problems with "getting her words out".  She knows what she wants to say, but has trouble getting the words out and finding words.  States this has progressed.  She is eating.  No nausea or vomiting.  Weight stable.  Still decreased but stable.  Discussed with her today.  Breathing stable.  No chest pain.  No headache or dizziness.  Has not f/u with ENT regarding her thyroid.     Past Medical History:  Diagnosis Date  . Environmental allergies   . Thyroid nodule    biopsy x 2 negative   Past Surgical History:  Procedure Laterality Date  . BREAST BIOPSY Left 1971   fibroadenoma, left breast  . TONSILLECTOMY     Family History  Problem Relation Age of Onset  . Cancer Mother     Colon  . Stroke Father   . Breast cancer Neg Hx    Social History   Social History  . Marital status: Married    Spouse name: N/A  . Number of children: 0  . Years of education: N/A   Social History Main Topics  . Smoking status: Never Smoker  . Smokeless tobacco: Never Used  . Alcohol use No     Comment: occasional wine with dinner  . Drug use: No  . Sexual activity: Not Asked   Other Topics Concern  . None   Social History Narrative  . None    Outpatient Encounter Prescriptions as of 08/30/2016  Medication Sig  . Calcium Carbonate (CALTRATE 600 PO) Take by mouth.  . Polyvinyl Alcohol-Povidone (REFRESH OP) Apply to eye.  Marland Kitchen SALINE NASAL SPRAY NA Place into the nose as needed.   No facility-administered encounter medications on file as of 08/30/2016.     Review of Systems  Constitutional: Negative for appetite change and unexpected weight change.  HENT: Negative for congestion and sinus  pressure.   Respiratory: Negative for cough, chest tightness and shortness of breath.   Cardiovascular: Negative for chest pain, palpitations and leg swelling.  Gastrointestinal: Negative for abdominal pain, diarrhea, nausea and vomiting.  Genitourinary: Negative for difficulty urinating and dysuria.  Musculoskeletal: Negative for joint swelling and myalgias.  Skin: Negative for color change and rash.  Neurological: Negative for dizziness and headaches.       Is having increasing trouble getting her words out.    Psychiatric/Behavioral: Negative for dysphoric mood.       Objective:     Blood pressure rechecked by me:  142/84  Physical Exam  Constitutional: No distress.  HENT:  Nose: Nose normal.  Mouth/Throat: Oropharynx is clear and moist.  Neck: Neck supple.  Cardiovascular: Normal rate and regular rhythm.   Pulmonary/Chest: Breath sounds normal. No respiratory distress. She has no wheezes.  Abdominal: Soft. Bowel sounds are normal. There is no tenderness.  Musculoskeletal: She exhibits no edema or tenderness.  Lymphadenopathy:    She has no cervical adenopathy.  Skin: No rash noted. No erythema.  Psychiatric: She has a normal mood and affect. Her behavior is normal.    BP 102/80   Pulse 74   Temp 99  F (37.2 C) (Oral)   Ht 5\' 2"  (1.575 m)   Wt 89 lb (40.4 kg)   SpO2 99%   BMI 16.28 kg/m  Wt Readings from Last 3 Encounters:  08/30/16 89 lb (40.4 kg)  12/22/15 87 lb 12 oz (39.8 kg)  11/03/15 85 lb (38.6 kg)     Lab Results  Component Value Date   WBC 7.0 01/05/2016   HGB 14.1 01/05/2016   HCT 41.9 01/05/2016   PLT 197.0 01/05/2016   GLUCOSE 97 01/05/2016   CHOL 180 06/23/2013   TRIG 58.0 06/23/2013   HDL 78.30 06/23/2013   LDLCALC 90 06/23/2013   ALT 16 01/05/2016   AST 21 01/05/2016   NA 141 01/05/2016   K 4.0 01/05/2016   CL 102 01/05/2016   CREATININE 1.08 01/05/2016   BUN 27 (H) 01/05/2016   CO2 26 01/05/2016   TSH 1.75 01/05/2016    Ct Head  Wo Contrast  Result Date: 11/03/2015 CLINICAL DATA:  Headache with nausea. EXAM: CT HEAD WITHOUT CONTRAST TECHNIQUE: Contiguous axial images were obtained from the base of the skull through the vertex without intravenous contrast. COMPARISON:  None. FINDINGS: No mass lesion. No midline shift. No acute hemorrhage or hematoma. No extra-axial fluid collections. No evidence of acute infarction. Brain parenchyma is normal. Osseous structures are normal. IMPRESSION: Normal exam. Electronically Signed   By: Lorriane Shire M.D.   On: 11/03/2015 15:51       Assessment & Plan:   Problem List Items Addressed This Visit    Memory change    With the documented memory change.  Having increased problems with getting her words out.  This is worsening.  Schedule MRI brain.        Relevant Orders   CBC with Differential/Platelet   Comprehensive metabolic panel   Vitamin [id number]   MR Brain W Wo Contrast   Thyroid nodule    S/p two biopsy.  Per report - negative.  Was seeing ENT.  Overdue f/u.  Order placed for referral.        Relevant Orders   TSH   Ambulatory referral to ENT   Weight loss    Weight stable.  Discussed with her today.  Encouraged increased po intake (including nutritional shakes).         Other Visit Diagnoses    Screening cholesterol level    -  Primary   Expressive aphasia       Relevant Orders   MR Brain W Wo Contrast       Einar Pheasant, MD

## 2016-09-01 ENCOUNTER — Encounter: Payer: Self-pay | Admitting: Internal Medicine

## 2016-09-01 NOTE — Assessment & Plan Note (Signed)
With the documented memory change.  Having increased problems with getting her words out.  This is worsening.  Schedule MRI brain.

## 2016-09-01 NOTE — Assessment & Plan Note (Signed)
S/p two biopsy.  Per report - negative.  Was seeing ENT.  Overdue f/u.  Order placed for referral.

## 2016-09-01 NOTE — Assessment & Plan Note (Signed)
Weight stable.  Discussed with her today.  Encouraged increased po intake (including nutritional shakes).

## 2016-09-03 ENCOUNTER — Encounter: Payer: Self-pay | Admitting: Internal Medicine

## 2016-09-11 ENCOUNTER — Ambulatory Visit
Admission: RE | Admit: 2016-09-11 | Discharge: 2016-09-11 | Disposition: A | Discharge status: Home / Self Care | Payer: Medicare Other | Source: Ambulatory Visit | Attending: Internal Medicine | Admitting: Internal Medicine

## 2016-09-11 DIAGNOSIS — R413 Other amnesia: Secondary | ICD-10-CM | POA: Diagnosis not present

## 2016-09-11 DIAGNOSIS — I6782 Cerebral ischemia: Secondary | ICD-10-CM | POA: Insufficient documentation

## 2016-09-11 DIAGNOSIS — R4701 Aphasia: Secondary | ICD-10-CM | POA: Diagnosis not present

## 2016-09-11 LAB — POCT I-STAT CREATININE: Creatinine, Ser: 1.2 mg/dL — ABNORMAL HIGH (ref 0.44–1.00)

## 2016-09-11 MED ORDER — GADOBENATE DIMEGLUMINE 529 MG/ML IV SOLN
10.0000 mL | Freq: Once | INTRAVENOUS | Status: AC | PRN
  Administered 2016-09-11: 7 mL via INTRAVENOUS

## 2016-09-12 ENCOUNTER — Telehealth: Payer: Self-pay | Admitting: *Deleted

## 2016-09-12 ENCOUNTER — Other Ambulatory Visit: Payer: Self-pay | Admitting: Internal Medicine

## 2016-09-12 DIAGNOSIS — R7989 Other specified abnormal findings of blood chemistry: Secondary | ICD-10-CM

## 2016-09-12 DIAGNOSIS — R4701 Aphasia: Secondary | ICD-10-CM

## 2016-09-12 NOTE — Telephone Encounter (Signed)
Pt can not be seen on Tuesdays.

## 2016-09-12 NOTE — Telephone Encounter (Signed)
Pt was called back and she had her friend hear CT results so she could write them down. Pt is okay with referral to neurology. Pt would like to have an appt with you in the next coming up month to follow up.

## 2016-09-12 NOTE — Telephone Encounter (Signed)
Information was provided to pt friend gail who pt gave verbal okay to speak with. She was wanting a appt to see you sometime in October. She is already scheduled for labs tomorrow.

## 2016-09-12 NOTE — Telephone Encounter (Signed)
Patient requested a call to discuss MRI test results, she could not remember all information given to her  Pt contact [phone]

## 2016-09-12 NOTE — Telephone Encounter (Signed)
I have placed the order for the referral.  Someone from the office will be contacting her with an appt date and time.  Also, she needs to repeat creatinine on Friday (tomorrow).  See previous message.

## 2016-09-13 ENCOUNTER — Other Ambulatory Visit (INDEPENDENT_AMBULATORY_CARE_PROVIDER_SITE_OTHER): Payer: Medicare Other

## 2016-09-13 DIAGNOSIS — R413 Other amnesia: Secondary | ICD-10-CM | POA: Diagnosis not present

## 2016-09-13 DIAGNOSIS — E041 Nontoxic single thyroid nodule: Secondary | ICD-10-CM | POA: Diagnosis not present

## 2016-09-13 DIAGNOSIS — R7989 Other specified abnormal findings of blood chemistry: Secondary | ICD-10-CM

## 2016-09-13 LAB — COMPREHENSIVE METABOLIC PANEL
ALBUMIN: 4.3 g/dL (ref 3.5–5.2)
ALK PHOS: 65 U/L (ref 39–117)
ALT: 18 U/L (ref 0–35)
AST: 24 U/L (ref 0–37)
BILIRUBIN TOTAL: 0.8 mg/dL (ref 0.2–1.2)
BUN: 14 mg/dL (ref 6–23)
CALCIUM: 8.9 mg/dL (ref 8.4–10.5)
CO2: 30 mEq/L (ref 19–32)
CREATININE: 0.94 mg/dL (ref 0.40–1.20)
Chloride: 99 mEq/L (ref 96–112)
GFR: 62.66 mL/min (ref >60.00)
Glucose, Bld: 80 mg/dL (ref 70–99)
Potassium: 4.1 mEq/L (ref 3.5–5.1)
Sodium: 133 mEq/L — ABNORMAL LOW (ref 135–145)
Total Protein: 7.7 g/dL (ref 6.0–8.3)

## 2016-09-13 LAB — VITAMIN B12: VITAMIN B 12: 315 pg/mL (ref 211–911)

## 2016-09-13 LAB — CBC WITH DIFFERENTIAL/PLATELET
BASOS ABS: 0 10*3/uL (ref 0.0–0.1)
Basophils Relative: 0.9 % (ref 0.0–3.0)
EOS PCT: 0.7 % (ref 0.0–5.0)
Eosinophils Absolute: 0 10*3/uL (ref 0.0–0.7)
HEMATOCRIT: 38.6 % (ref 36.0–46.0)
Hemoglobin: 13 g/dL (ref 12.0–15.0)
LYMPHS PCT: 28.8 % (ref 12.0–46.0)
Lymphs Abs: 1.4 10*3/uL (ref 0.7–4.0)
MCHC: 33.7 g/dL (ref 30.0–36.0)
MCV: 93.7 fl (ref 78.0–100.0)
MONOS PCT: 6.8 % (ref 3.0–12.0)
Monocytes Absolute: 0.3 10*3/uL (ref 0.1–1.0)
NEUTROS ABS: 3 10*3/uL (ref 1.4–7.7)
Neutrophils Relative %: 62.8 % (ref 43.0–77.0)
Platelets: 207 10*3/uL (ref 150.0–400.0)
RBC: 4.12 Mil/uL (ref 3.87–5.11)
RDW: 13.4 % (ref 11.5–15.5)
WBC: 4.8 10*3/uL (ref 4.0–10.5)

## 2016-09-13 LAB — TSH: TSH: 1.24 u[IU]/mL (ref 0.35–4.50)

## 2016-09-13 NOTE — Telephone Encounter (Signed)
I can see her 10/09/16 (wednesday at 12:30).  Thanks.

## 2016-09-13 NOTE — Telephone Encounter (Signed)
Pt called back stating that she does not need the appt. Thank you!

## 2016-09-13 NOTE — Telephone Encounter (Signed)
Lm on vm to schedule appt for 10/25 @12 :30

## 2016-09-16 ENCOUNTER — Other Ambulatory Visit: Payer: Medicare Other

## 2016-09-19 ENCOUNTER — Other Ambulatory Visit: Payer: Self-pay | Admitting: Otolaryngology

## 2016-09-19 DIAGNOSIS — E041 Nontoxic single thyroid nodule: Secondary | ICD-10-CM

## 2016-10-11 ENCOUNTER — Other Ambulatory Visit: Payer: Self-pay | Admitting: Internal Medicine

## 2016-10-11 DIAGNOSIS — Z1231 Encounter for screening mammogram for malignant neoplasm of breast: Secondary | ICD-10-CM

## 2016-10-14 ENCOUNTER — Telehealth: Payer: Self-pay | Admitting: Internal Medicine

## 2016-10-14 NOTE — Telephone Encounter (Signed)
I called pt and left a vm to call office to sch AWV. Thank you! °

## 2016-10-22 DIAGNOSIS — F0280 Dementia in other diseases classified elsewhere without behavioral disturbance: Secondary | ICD-10-CM | POA: Diagnosis not present

## 2016-10-22 DIAGNOSIS — G3101 Pick's disease: Secondary | ICD-10-CM | POA: Diagnosis not present

## 2016-11-22 ENCOUNTER — Ambulatory Visit: Payer: No Typology Code available for payment source

## 2016-11-29 ENCOUNTER — Ambulatory Visit: Payer: Medicare Other | Attending: Neurology | Admitting: Speech Pathology

## 2016-11-29 ENCOUNTER — Encounter: Payer: Self-pay | Admitting: Speech Pathology

## 2016-11-29 DIAGNOSIS — R4701 Aphasia: Secondary | ICD-10-CM | POA: Diagnosis not present

## 2016-11-29 NOTE — Therapy (Signed)
[place] MAIN [facility] SERVICES [address] [place], [place], [address] Phone: [phone]   Fax:  [phone]  Speech Language Pathology Evaluation  Patient Details  Name: Carol Beard MRN: [id number] Date of Birth: September 20, 1946 Referring Provider: Vladimir Crofts   Encounter Date: 11/29/2016      End of Session - 11/29/16 1248    Visit Number 1   Number of Visits 25   Date for SLP Re-Evaluation 02/27/17   SLP Start Time 1010   SLP Stop Time  1115   SLP Time Calculation (min) 65 min   Activity Tolerance Patient tolerated treatment well      Past Medical History:  Diagnosis Date  . Environmental allergies   . Thyroid nodule    biopsy x 2 negative    Past Surgical History:  Procedure Laterality Date  . BREAST BIOPSY Left 1971   fibroadenoma, left breast  . TONSILLECTOMY      There were no vitals filed for this visit.          SLP Evaluation [facility] - 11/29/16 0001      SLP Visit Information   SLP Received On 11/29/16   Referring Provider [facility], [facility] K    Onset Date 10/23/2016   Medical Diagnosis Primary Progressive Aphasia     Subjective   Subjective The patient is embarassed by her word finding difficulty   Patient/Family Stated Goal Improved communication     General Information   HPI 70 year old woman recently diagnosed with Primary Progressive Apha     Prior Functional Status   Cognitive/Linguistic Baseline Within functional limits     Cognition   Overall Cognitive Status --  Not tested today     Auditory Comprehension   Overall Auditory Comprehension Impaired   Overall Auditory Comprehension Comments comprehends simple verbal information     Verbal Expression   Overall Verbal Expression Impaired   Initiation No impairment   Level of Generative/Spontaneous Verbalization Conversation   Repetition No impairment   Naming Impairment   Confrontation 50-74% accurate   Other Verbal Expression Comments  Spontaneous speech is vague     Oral Motor/Sensory Function   Overall Oral Motor/Sensory Function Appears within functional limits for tasks assessed     Motor Speech   Overall Motor Speech Appears within functional limits for tasks assessed     Standardized Assessments   Standardized Assessments  Western Aphasia Battery revised       Western Aphasia Battery- Revised  Spontaneous Speech      Information content  5/10       Fluency   5/10     Comprehension     Yes/No questions  60/60        Auditory Word Recognition 52/60        Sequential Commands 56/80    Repetition   80/100     Naming    Object Naming  41/60        Word Fluency   3/20        Sentence Completion  10/10        Responsive Speech   10/10     Aphasia Quotient  65.6/100        SLP Education - 11/29/16 1247    Education provided Yes   Education Details Results and potential goals   Person(s) Educated Patient;Other (comment)   Methods Explanation   Comprehension Verbalized understanding  SLP Long Term Goals - 11/29/16 1252      SLP LONG TERM GOAL #1   Title Patient will complete semantic feature word finding tasks with 80% accuracy.   Time 12   Period Weeks   Status New     SLP LONG TERM GOAL #2   Title Patient will generate grammatical and cogent sentence to complete simple/concrete linguistic task with 80% accuracy.   Time 12   Period Weeks   Status New     SLP LONG TERM GOAL #3   Title Patient complete functional reading tasks with 80% accuracy.   Time 12   Period Weeks   Status New     SLP LONG TERM GOAL #4   Title Patient complete functional writing tasks with 80% accuracy.   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  The patient is able to benefit from skilled cuing to retrieve words and respond to more complex verbal information.  The patient will benefit from skilled speech therapy for restorative and compensatory treatment of aphasia as well as ongoing assessment of skills and needs.   Speech Therapy Frequency 2x / week   Duration Other (comment)  12 weeks   Treatment/Interventions Language facilitation;Cueing hierarchy;Compensatory techniques;Multimodal communcation approach;SLP instruction and feedback;Patient/family education   Potential to Achieve Goals Good   Potential Considerations Ability to learn/carryover information;Co-morbidities;Cooperation/participation level;Medical prognosis;Previous level of function;Severity of impairments;Family/community support   SLP Home Exercise Plan To be determined   Consulted and Agree with Plan of Care Patient;Other (Comment)   Family Member Consulted Friend      Patient will benefit from skilled therapeutic intervention in order to improve the following deficits and impairments:   Aphasia - Plan: SLP plan of care cert/re-cert      G-Codes - [id number]    Functional Assessment Tool Used WAB- R, clinical judgment   Functional Limitations Spoken language expressive   Spoken Language Expression Current Status [phone]) At least 60 percent but less than 80 percent impaired, limited or restricted   Spoken Language Expression Goal Status [id number]) At least 20 percent but less than 40 percent impaired, limited or restricted      Problem List Patient Active Problem List   Diagnosis Date Noted  . Breast nodule 12/24/2015  . Headache 12/24/2015  . Health care maintenance 06/18/2015  . Environmental allergies 06/18/2015  . Toenail fungus 07/31/2014  . Weight loss 03/28/2014  . History of colonic polyps 10/28/2013  . Knee pain 09/09/2013  . Skin lesion 09/09/2013  . Memory change 06/18/2013  . Hand numbness 06/18/2013  . Fullness  of breast 06/16/2013  . Thyroid nodule 01/13/2013   Leroy Sea, MS/CCC- SLP  [name] 11/29/2016, 1:00 PM  [place] MAIN [facility] SERVICES [address] [place], [place], [address] Phone: [phone]   Fax:  [phone]  Name: TIYAH BABKA MRN: [id number] Date of Birth: Oct 23, 1946

## 2016-12-03 ENCOUNTER — Encounter: Payer: No Typology Code available for payment source | Admitting: Speech Pathology

## 2016-12-04 ENCOUNTER — Ambulatory Visit: Payer: No Typology Code available for payment source

## 2016-12-06 ENCOUNTER — Ambulatory Visit: Payer: Medicare Other | Admitting: Speech Pathology

## 2016-12-12 ENCOUNTER — Ambulatory Visit: Payer: Medicare Other | Admitting: Speech Pathology

## 2016-12-12 DIAGNOSIS — R4701 Aphasia: Secondary | ICD-10-CM | POA: Diagnosis not present

## 2016-12-13 ENCOUNTER — Encounter: Payer: Self-pay | Admitting: Speech Pathology

## 2016-12-13 NOTE — Therapy (Signed)
[place] MAIN [facility] SERVICES [address] [place], [place], [address] Phone: [phone]   Fax:  [phone]  Speech Language Pathology Treatment  Patient Details  Name: Carol Beard MRN: [id number] Date of Birth: 1946/05/16 Referring Provider: Vladimir Crofts   Encounter Date: 12/12/2016      End of Session - 12/13/16 1312    Visit Number 2   Number of Visits 25   Date for SLP Re-Evaluation 02/27/17   SLP Start Time 1100   SLP Stop Time  1200   SLP Time Calculation (min) 60 min      Past Medical History:  Diagnosis Date  . Environmental allergies   . Thyroid nodule    biopsy x 2 negative    Past Surgical History:  Procedure Laterality Date  . BREAST BIOPSY Left 1971   fibroadenoma, left breast  . TONSILLECTOMY      There were no vitals filed for this visit.      Subjective Assessment - 12/13/16 1301    Subjective Patient with multiple questions regarding diagnosis and prognosis   Currently in Pain? No/denies               ADULT SLP TREATMENT - 12/13/16 0001      General Information   Behavior/Cognition Alert;Cooperative;Pleasant mood;Distractible     Treatment Provided   Treatment provided Cognitive-Linquistic     Pain Assessment   Pain Assessment No/denies pain     Cognitive-Linquistic Treatment   Treatment focused on Aphasia   Skilled Treatment Completed the Reading and Writing portions of the Western Aphasia Battery- Revised: Reading- 70/100; Writing: 10.5/100.  The patient requires some support to achieve these scores.  It took the entire session to complete this assessment due to patient's off task (appropriate, but off task for setting) questions and remarks.     Assessment / Recommendations / Plan   Plan Continue with current plan of care     Progression Toward Goals   Progression toward goals Progressing toward goals          SLP Education - 12/13/16 1302    Education provided Yes    Education Details results and potential goals   Person(s) Educated Patient;Other (comment)   Methods Explanation   Comprehension Verbalized understanding            SLP Long Term Goals - 11/29/16 1252      SLP LONG TERM GOAL #1   Title Patient will complete semantic feature word finding tasks with 80% accuracy.   Time 12   Period Weeks   Status New     SLP LONG TERM GOAL #2   Title Patient will generate grammatical and cogent sentence to complete simple/concrete linguistic task with 80% accuracy.   Time 12   Period Weeks   Status New     SLP LONG TERM GOAL #3   Title Patient complete functional reading tasks with 80% accuracy.   Time 12   Period Weeks   Status New     SLP LONG TERM GOAL #4   Title Patient complete functional writing tasks with 80% accuracy.   Time 12   Period Weeks   Status New          Plan - 12/13/16 1313    Clinical Impression Statement Further testing shows the patient has relative language strength in reading and very poor skills in writing.  Will plan to assess non-verbal communication skills next session and begin therapy for  aphasia.   Speech Therapy Frequency 2x / week   Duration Other (comment)   Treatment/Interventions Language facilitation;Cueing hierarchy;Compensatory techniques;Multimodal communcation approach;SLP instruction and feedback;Patient/family education   Potential to Achieve Goals Good   Potential Considerations Ability to learn/carryover information;Co-morbidities;Cooperation/participation level;Medical prognosis;Previous level of function;Severity of impairments;Family/community support   SLP Home Exercise Plan Begin copying name, alphabet   Consulted and Agree with Plan of Care Patient;Other (Comment)   Family Member Consulted Friend      Patient will benefit from skilled therapeutic intervention in order to improve the following deficits and impairments:   Aphasia    Problem List Patient Active Problem List    Diagnosis Date Noted  . Breast nodule 12/24/2015  . Headache 12/24/2015  . Health care maintenance 06/18/2015  . Environmental allergies 06/18/2015  . Toenail fungus 07/31/2014  . Weight loss 03/28/2014  . History of colonic polyps 10/28/2013  . Knee pain 09/09/2013  . Skin lesion 09/09/2013  . Memory change 06/18/2013  . Hand numbness 06/18/2013  . Fullness of breast 06/16/2013  . Thyroid nodule 01/13/2013   Leroy Sea, MS/CCC- SLP  [name] 12/13/2016, 1:14 PM  [place] MAIN [facility] SERVICES [address] [place], [place], [address] Phone: [phone]   Fax:  [phone]   Name: Carol Beard MRN: [id number] Date of Birth: 05/24/1946

## 2016-12-17 ENCOUNTER — Encounter: Payer: No Typology Code available for payment source | Admitting: Speech Pathology

## 2016-12-19 ENCOUNTER — Ambulatory Visit: Payer: Medicare Other | Admitting: Speech Pathology

## 2016-12-19 ENCOUNTER — Telehealth: Payer: Self-pay | Admitting: Internal Medicine

## 2016-12-19 NOTE — Telephone Encounter (Signed)
Pt will call back later on to sch for AWV. Thank you!

## 2016-12-23 ENCOUNTER — Ambulatory Visit: Payer: Medicare Other | Attending: Neurology | Admitting: Speech Pathology

## 2016-12-23 DIAGNOSIS — R4701 Aphasia: Secondary | ICD-10-CM

## 2016-12-24 ENCOUNTER — Encounter: Payer: No Typology Code available for payment source | Admitting: Speech Pathology

## 2016-12-24 ENCOUNTER — Encounter: Payer: Self-pay | Admitting: Speech Pathology

## 2016-12-24 NOTE — Therapy (Signed)
[place] MAIN [facility] SERVICES [address] [place], [place], [address] Phone: [phone]   Fax:  [phone]  Speech Language Pathology Treatment  Patient Details  Name: Carol Beard MRN: [id number] Date of Birth: [birth year] Referring Provider: Vladimir Crofts   Encounter Date: 12/23/2016      End of Session - 12/24/16 1028    Visit Number 3   Number of Visits 25   Date for SLP Re-Evaluation 02/27/17   SLP Start Time 1000   SLP Stop Time  1100   SLP Time Calculation (min) 60 min      Past Medical History:  Diagnosis Date  . Environmental allergies   . Thyroid nodule    biopsy x 2 negative    Past Surgical History:  Procedure Laterality Date  . BREAST BIOPSY Left 1971   fibroadenoma, left breast  . TONSILLECTOMY      There were no vitals filed for this visit.      Subjective Assessment - 12/24/16 1027    Subjective Patient and friend report that the patient is usually more fluent in social situations               ADULT SLP TREATMENT - 12/24/16 0001      General Information   Behavior/Cognition Alert;Cooperative;Pleasant mood;Distractible     Treatment Provided   Treatment provided Cognitive-Linquistic     Pain Assessment   Pain Assessment No/denies pain     Cognitive-Linquistic Treatment   Treatment focused on Aphasia   Skilled Treatment TESTING: Completed the non-verbal communication section of the Aphasia Diagnostic Profiles: significant difficulty with gestures.  It took half of the session to complete this assessment due to patient's off task (appropriate, but off task for setting) questions and remarks.  WORD FINDING: Complete semantic feature analysis worksheet with min cues, identify wrong item in list of 5 with 80% accuracy and state reason with 70% accuracy.     Assessment / Recommendations / Plan   Plan Continue with current plan of care     Progression Toward Goals   Progression toward  goals Progressing toward goals          SLP Education - 12/24/16 1028    Education provided Yes   Education Details Patient given written information regarding PPA and word finding strategies   Person(s) Educated Patient;Other (comment)   Methods Explanation   Comprehension Verbalized understanding            SLP Long Term Goals - 11/29/16 1252      SLP LONG TERM GOAL #1   Title Patient will complete semantic feature word finding tasks with 80% accuracy.   Time 12   Period Weeks   Status New     SLP LONG TERM GOAL #2   Title Patient will generate grammatical and cogent sentence to complete simple/concrete linguistic task with 80% accuracy.   Time 12   Period Weeks   Status New     SLP LONG TERM GOAL #3   Title Patient complete functional reading tasks with 80% accuracy.   Time 12   Period Weeks   Status New     SLP LONG TERM GOAL #4   Title Patient complete functional writing tasks with 80% accuracy.   Time 12   Period Weeks   Status New          Plan - 12/24/16 1029    Clinical Impression Statement Patient able to participate in simple word finding  activities, including naming categories and semantic feature analysis.  She has difficulty understanding the concept of non-verbal communication.   Speech Therapy Frequency 2x / week   Duration Other (comment)   Treatment/Interventions Language facilitation;Cueing hierarchy;Compensatory techniques;Multimodal communcation approach;SLP instruction and feedback;Patient/family education   Potential to Achieve Goals Good   Potential Considerations Ability to learn/carryover information;Co-morbidities;Cooperation/participation level;Medical prognosis;Previous level of function;Severity of impairments;Family/community support   SLP Home Exercise Plan Patient given semantic feature analysis work sheet and naming categories   Consulted and Agree with Plan of Care Patient;Other (Comment)   Family Member Consulted Friend       Patient will benefit from skilled therapeutic intervention in order to improve the following deficits and impairments:   Aphasia    Problem List Patient Active Problem List   Diagnosis Date Noted  . Breast nodule 12/24/2015  . Headache 12/24/2015  . Health care maintenance 06/18/2015  . Environmental allergies 06/18/2015  . Toenail fungus 07/31/2014  . Weight loss 03/28/2014  . History of colonic polyps 10/28/2013  . Knee pain 09/09/2013  . Skin lesion 09/09/2013  . Memory change 06/18/2013  . Hand numbness 06/18/2013  . Fullness of breast 06/16/2013  . Thyroid nodule 01/13/2013   Leroy Sea, MS/CCC- SLP  [name] 12/24/2016, 10:30 AM  [place] MAIN [facility] SERVICES [address] [place], [place], [address] Phone: [phone]   Fax:  [phone]   Name: Carol Beard MRN: [id number] Date of Birth: Nov 06, 1946

## 2016-12-26 ENCOUNTER — Ambulatory Visit: Payer: Medicare Other | Admitting: Speech Pathology

## 2016-12-30 DIAGNOSIS — F0280 Dementia in other diseases classified elsewhere without behavioral disturbance: Secondary | ICD-10-CM | POA: Diagnosis not present

## 2016-12-30 DIAGNOSIS — R634 Abnormal weight loss: Secondary | ICD-10-CM | POA: Diagnosis not present

## 2016-12-30 DIAGNOSIS — G3101 Pick's disease: Secondary | ICD-10-CM | POA: Diagnosis not present

## 2016-12-31 ENCOUNTER — Encounter: Payer: No Typology Code available for payment source | Admitting: Speech Pathology

## 2017-01-02 ENCOUNTER — Encounter: Payer: No Typology Code available for payment source | Admitting: Speech Pathology

## 2017-01-06 ENCOUNTER — Encounter: Payer: Self-pay | Admitting: Speech Pathology

## 2017-01-06 ENCOUNTER — Ambulatory Visit: Payer: Medicare Other | Admitting: Speech Pathology

## 2017-01-06 DIAGNOSIS — R4701 Aphasia: Secondary | ICD-10-CM

## 2017-01-06 NOTE — Therapy (Signed)
[place] MAIN [facility] SERVICES [address] [place], [place], [address] Phone: [phone]   Fax:  [phone]  Speech Language Pathology Treatment  Patient Details  Name: Carol Beard MRN: [id number] Date of Birth: 02-02-1946 Referring Provider: Jennings Books K   Encounter Date: 01/06/2017      End of Session - 01/06/17 1740    Visit Number 4   Number of Visits 25   Date for SLP Re-Evaluation 02/27/17   SLP Start Time 87   SLP Stop Time  1200   SLP Time Calculation (min) 60 min   Activity Tolerance Patient tolerated treatment well      Past Medical History:  Diagnosis Date  . Environmental allergies   . Thyroid nodule    biopsy x 2 negative    Past Surgical History:  Procedure Laterality Date  . BREAST BIOPSY Left 1971   fibroadenoma, left breast  . TONSILLECTOMY      There were no vitals filed for this visit.             ADULT SLP TREATMENT - 01/06/17 0001      General Information   Behavior/Cognition Alert;Cooperative;Pleasant mood;Distractible     Treatment Provided   Treatment provided Cognitive-Linquistic     Pain Assessment   Pain Assessment No/denies pain     Cognitive-Linquistic Treatment   Treatment focused on Aphasia   Skilled Treatment WORD FINDING:  Generate 5 nouns, 5 verbs, and 5 adjectives given picture stimulus with mod-max cues for verbs.  Name action given simple action picture with 50% accuracy (specificity in language), improves to [id number] with cues to elicit more specific target.  Select word to complete sentence with repeated instruction.     Assessment / Recommendations / Plan   Plan Continue with current plan of care     Progression Toward Goals   Progression toward goals Progressing toward goals          SLP Education - 01/06/17 1739    Education provided Yes   Education Details Circumlocution to aid communication   Person(s) Educated Patient;Other (comment)   Methods  Explanation   Comprehension Verbalized understanding            SLP Long Term Goals - 11/29/16 1252      SLP LONG TERM GOAL #1   Title Patient will complete semantic feature word finding tasks with 80% accuracy.   Time 12   Period Weeks   Status New     SLP LONG TERM GOAL #2   Title Patient will generate grammatical and cogent sentence to complete simple/concrete linguistic task with 80% accuracy.   Time 12   Period Weeks   Status New     SLP LONG TERM GOAL #3   Title Patient complete functional reading tasks with 80% accuracy.   Time 12   Period Weeks   Status New     SLP LONG TERM GOAL #4   Title Patient complete functional writing tasks with 80% accuracy.   Time 12   Period Weeks   Status New          Plan - 01/06/17 1740    Clinical Impression Statement Patient able to participate in simple word finding activities, including naming specific parts of speech and pictured verbs. Her language is frequently vague/empty, but responds to cueing to improve specificity.      Speech Therapy Frequency 2x / week   Duration Other (comment)   Treatment/Interventions Language facilitation;Cueing hierarchy;Compensatory  techniques;Multimodal communcation approach;SLP instruction and feedback;Patient/family education   Potential to Achieve Goals Good   Potential Considerations Ability to learn/carryover information;Co-morbidities;Cooperation/participation level;Medical prognosis;Previous level of function;Severity of impairments;Family/community support   SLP Home Exercise Plan sentence completion/selecting words   Consulted and Agree with Plan of Care Patient;Other (Comment)   Family Member Consulted Friend      Patient will benefit from skilled therapeutic intervention in order to improve the following deficits and impairments:   Aphasia    Problem List Patient Active Problem List   Diagnosis Date Noted  . Breast nodule 12/24/2015  . Headache 12/24/2015  . Health  care maintenance 06/18/2015  . Environmental allergies 06/18/2015  . Toenail fungus 07/31/2014  . Weight loss 03/28/2014  . History of colonic polyps 10/28/2013  . Knee pain 09/09/2013  . Skin lesion 09/09/2013  . Memory change 06/18/2013  . Hand numbness 06/18/2013  . Fullness of breast 06/16/2013  . Thyroid nodule 01/13/2013   Leroy Sea, MS/CCC- SLP  [name] 01/06/2017, 5:41 PM  [place] MAIN [facility] SERVICES [address] [place], [place], [address] Phone: [phone]   Fax:  [phone]   Name: Carol Beard MRN: [id number] Date of Birth: 01-16-1946

## 2017-01-08 ENCOUNTER — Ambulatory Visit: Payer: Medicare Other | Admitting: Speech Pathology

## 2017-01-13 ENCOUNTER — Encounter: Payer: No Typology Code available for payment source | Admitting: Speech Pathology

## 2017-01-15 ENCOUNTER — Encounter: Payer: No Typology Code available for payment source | Admitting: Speech Pathology

## 2017-01-16 ENCOUNTER — Ambulatory Visit: Payer: Medicare Other | Admitting: Speech Pathology

## 2017-01-20 ENCOUNTER — Encounter: Payer: Self-pay | Admitting: Speech Pathology

## 2017-01-20 ENCOUNTER — Ambulatory Visit: Payer: Medicare Other | Attending: Neurology | Admitting: Speech Pathology

## 2017-01-20 DIAGNOSIS — R4701 Aphasia: Secondary | ICD-10-CM | POA: Diagnosis not present

## 2017-01-20 NOTE — Therapy (Signed)
[place] MAIN [facility] SERVICES [address] [place], [place], [address] Phone: [phone]   Fax:  [phone]  Speech Language Pathology Treatment  Patient Details  Name: Carol Beard MRN: [id number] Date of Birth: 09-Oct-1946 Referring Provider: Vladimir Crofts   Encounter Date: 01/20/2017      End of Session - 01/20/17 1214    Visit Number 5   Number of Visits 25   Date for SLP Re-Evaluation 02/27/17   SLP Start Time 1104   SLP Stop Time  [id number]   SLP Time Calculation (min) 60 min   Activity Tolerance Patient tolerated treatment well      Past Medical History:  Diagnosis Date  . Environmental allergies   . Thyroid nodule    biopsy x 2 negative    Past Surgical History:  Procedure Laterality Date  . BREAST BIOPSY Left 1971   fibroadenoma, left breast  . TONSILLECTOMY      There were no vitals filed for this visit.      Subjective Assessment - 01/20/17 1212    Subjective "I'm so embarassed" regarding difficulty with completing the reading task   Currently in Pain? No/denies               ADULT SLP TREATMENT - 01/20/17 0001      General Information   Behavior/Cognition Alert;Cooperative;Pleasant mood;Distractible     Treatment Provided   Treatment provided Cognitive-Linquistic     Pain Assessment   Pain Assessment No/denies pain     Cognitive-Linquistic Treatment   Treatment focused on Aphasia   Skilled Treatment WORD FINDING:  Name action given simple action picture with 80% accuracy (specificity in language).  Patient cannot independently self-cue by gesturing or circumlocution, although she benefits from these cues.  READING COMPREHENSION:  follow 1-step, 2 component written directions with 50% accuracy.     Assessment / Recommendations / Plan   Plan Continue with current plan of care     Progression Toward Goals   Progression toward goals Progressing toward goals          SLP Education -  01/20/17 1213    Education provided Yes   Education Details Emafassment does not improve her functional communication   Person(s) Educated Patient;Other (comment)   Methods Explanation   Comprehension Verbalized understanding            SLP Long Term Goals - 11/29/16 1252      SLP LONG TERM GOAL #1   Title Patient will complete semantic feature word finding tasks with 80% accuracy.   Time 12   Period Weeks   Status New     SLP LONG TERM GOAL #2   Title Patient will generate grammatical and cogent sentence to complete simple/concrete linguistic task with 80% accuracy.   Time 12   Period Weeks   Status New     SLP LONG TERM GOAL #3   Title Patient complete functional reading tasks with 80% accuracy.   Time 12   Period Weeks   Status New     SLP LONG TERM GOAL #4   Title Patient complete functional writing tasks with 80% accuracy.   Time 12   Period Weeks   Status New          Plan - 01/20/17 1214    Clinical Impression Statement Patient able to participate in simple word finding activities, including naming specific parts of speech and pictured verbs. Her language is frequently vague/empty, but responds  to cueing to improve specificity.      Speech Therapy Frequency 2x / week   Duration Other (comment)   Treatment/Interventions Language facilitation;Cueing hierarchy;Compensatory techniques;Multimodal communcation approach;SLP instruction and feedback;Patient/family education   Potential to Achieve Goals Good   Potential Considerations Ability to learn/carryover information;Co-morbidities;Cooperation/participation level;Medical prognosis;Previous level of function;Severity of impairments;Family/community support   Consulted and Agree with Plan of Care Patient;Other (Comment)   Family Member Consulted Friend      Patient will benefit from skilled therapeutic intervention in order to improve the following deficits and impairments:   Aphasia    Problem  List Patient Active Problem List   Diagnosis Date Noted  . Breast nodule 12/24/2015  . Headache 12/24/2015  . Health care maintenance 06/18/2015  . Environmental allergies 06/18/2015  . Toenail fungus 07/31/2014  . Weight loss 03/28/2014  . History of colonic polyps 10/28/2013  . Knee pain 09/09/2013  . Skin lesion 09/09/2013  . Memory change 06/18/2013  . Hand numbness 06/18/2013  . Fullness of breast 06/16/2013  . Thyroid nodule 01/13/2013   Leroy Sea, MS/CCC- SLP  [name] 01/20/2017, 12:15 PM  [place] MAIN [facility] SERVICES [address] [place], [place], [address] Phone: [phone]   Fax:  [phone]   Name: Carol Beard MRN: [id number] Date of Birth: 11-10-1946

## 2017-01-24 ENCOUNTER — Ambulatory Visit: Payer: Medicare Other | Admitting: Speech Pathology

## 2017-01-24 ENCOUNTER — Encounter: Payer: Self-pay | Admitting: Speech Pathology

## 2017-01-24 DIAGNOSIS — R4701 Aphasia: Secondary | ICD-10-CM

## 2017-01-24 NOTE — Therapy (Signed)
[place] MAIN [facility] SERVICES [address] [place], [place], [address] Phone: [phone]   Fax:  [phone]  Speech Language Pathology Treatment  Patient Details  Name: Carol Beard MRN: [id number] Date of Birth: [birth year] Referring Provider: Vladimir Crofts   Encounter Date: 01/24/2017      End of Session - 01/24/17 1549    Visit Number 6   Number of Visits 25   Date for SLP Re-Evaluation 02/27/17   SLP Start Time 0922   SLP Stop Time  1000   SLP Time Calculation (min) 38 min      Past Medical History:  Diagnosis Date  . Environmental allergies   . Thyroid nodule    biopsy x 2 negative    Past Surgical History:  Procedure Laterality Date  . BREAST BIOPSY Left 1971   fibroadenoma, left breast  . TONSILLECTOMY      There were no vitals filed for this visit.      Subjective Assessment - 01/24/17 1548    Subjective "I'm so embarassed" regarding difficulty with completing the reading task   Currently in Pain? No/denies               ADULT SLP TREATMENT - 01/24/17 0001      General Information   Behavior/Cognition Alert;Cooperative;Pleasant mood;Distractible     Treatment Provided   Treatment provided Cognitive-Linquistic     Pain Assessment   Pain Assessment No/denies pain     Cognitive-Linquistic Treatment   Treatment focused on Aphasia   Skilled Treatment WORD FINDING:  Name item given written definition with 20% accuracy independently.  Patient cannot independently self-cue by gesturing or circumlocution, although she benefits from these cues.  Given array of 5, identify picture described by short written phrase with 100% accuracy and name picture with 90% accuracy.  READING COMPREHENSION:  follow 1-step, 2 component written directions with 20% accuracy independently and 100% given mod-max cues.     Assessment / Recommendations / Plan   Plan Continue with current plan of care     Progression Toward  Goals   Progression toward goals Progressing toward goals          SLP Education - 01/24/17 1548    Education provided Yes   Education Details Need to tolerate the struggle in oder to maintain optimal functional communication   Person(s) Educated Patient;Other (comment)   Methods Explanation   Comprehension Verbalized understanding            SLP Long Term Goals - 11/29/16 1252      SLP LONG TERM GOAL #1   Title Patient will complete semantic feature word finding tasks with 80% accuracy.   Time 12   Period Weeks   Status New     SLP LONG TERM GOAL #2   Title Patient will generate grammatical and cogent sentence to complete simple/concrete linguistic task with 80% accuracy.   Time 12   Period Weeks   Status New     SLP LONG TERM GOAL #3   Title Patient complete functional reading tasks with 80% accuracy.   Time 12   Period Weeks   Status New     SLP LONG TERM GOAL #4   Title Patient complete functional writing tasks with 80% accuracy.   Time 12   Period Weeks   Status New          Plan - 01/24/17 1549    Clinical Impression Statement Patient able to participate  in simple word finding activities, including naming specific parts of speech and pictured verbs. Her language is frequently vague/empty, but responds to cueing to improve specificity.      Speech Therapy Frequency 2x / week   Duration Other (comment)   Treatment/Interventions Language facilitation;Cueing hierarchy;Compensatory techniques;Multimodal communcation approach;SLP instruction and feedback;Patient/family education   Potential to Achieve Goals Good   Potential Considerations Ability to learn/carryover information;Co-morbidities;Cooperation/participation level;Medical prognosis;Previous level of function;Severity of impairments;Family/community support   SLP Home Exercise Plan identify picture and name it given written description/definition   Consulted and Agree with Plan of Care Patient;Other  (Comment)   Family Member Consulted Friend      Patient will benefit from skilled therapeutic intervention in order to improve the following deficits and impairments:   Aphasia    Problem List Patient Active Problem List   Diagnosis Date Noted  . Breast nodule 12/24/2015  . Headache 12/24/2015  . Health care maintenance 06/18/2015  . Environmental allergies 06/18/2015  . Toenail fungus 07/31/2014  . Weight loss 03/28/2014  . History of colonic polyps 10/28/2013  . Knee pain 09/09/2013  . Skin lesion 09/09/2013  . Memory change 06/18/2013  . Hand numbness 06/18/2013  . Fullness of breast 06/16/2013  . Thyroid nodule 01/13/2013   Leroy Sea, MS/CCC- SLP  [name] 01/24/2017, 3:51 PM  [place] MAIN [facility] SERVICES [address] [place], [place], [address] Phone: [phone]   Fax:  [phone]   Name: Carol Beard MRN: [id number] Date of Birth: [birth year]

## 2017-01-31 ENCOUNTER — Ambulatory Visit: Payer: Medicare Other | Admitting: Speech Pathology

## 2017-01-31 ENCOUNTER — Encounter: Payer: Self-pay | Admitting: Speech Pathology

## 2017-01-31 DIAGNOSIS — R4701 Aphasia: Secondary | ICD-10-CM | POA: Diagnosis not present

## 2017-01-31 NOTE — Therapy (Signed)
[place] MAIN [facility] SERVICES [address] [place], [place], [address] Phone: [phone]   Fax:  [phone]  Speech Language Pathology Treatment  Patient Details  Name: Carol Beard MRN: [id number] Date of Birth: 11-30-1946 Referring Provider: Vladimir Crofts   Encounter Date: 01/31/2017      End of Session - 01/31/17 1326    Visit Number 7   Number of Visits 25   Date for SLP Re-Evaluation 02/27/17   SLP Start Time 46   SLP Stop Time  1200   SLP Time Calculation (min) 60 min   Activity Tolerance Patient tolerated treatment well      Past Medical History:  Diagnosis Date  . Environmental allergies   . Thyroid nodule    biopsy x 2 negative    Past Surgical History:  Procedure Laterality Date  . BREAST BIOPSY Left 1971   fibroadenoma, left breast  . TONSILLECTOMY      There were no vitals filed for this visit.      Subjective Assessment - 01/31/17 1325    Subjective "I'm so embarassed" regarding errors in naming task   Currently in Pain? No/denies               ADULT SLP TREATMENT - 01/31/17 0001      General Information   Behavior/Cognition Alert;Cooperative;Pleasant mood;Distractible     Treatment Provided   Treatment provided Cognitive-Linquistic     Pain Assessment   Pain Assessment No/denies pain     Cognitive-Linquistic Treatment   Treatment focused on Aphasia   Skilled Treatment WORD FINDING:  Name item in a category stating with specified letter with 20% accuracy independently and 80% given SLP cues.  Patient cannot independently self-cue by gesturing or circumlocution, although she benefits from these cues.  Name pictured objects with 80% accuracy independently and 100% given min SLP cues.  READING COMPREHENSION:  follow 1-step, 2 component written directions with 40% accuracy independently and 80% given mod-max cues.     Assessment / Recommendations / Plan   Plan Continue with current plan  of care     Progression Toward Goals   Progression toward goals Progressing toward goals          SLP Education - 01/31/17 1325    Education provided Yes   Education Details Need to tolerate the struggle/embarassment in order to maintain optimal functional communication   Person(s) Educated Patient;Other (comment)   Methods Explanation   Comprehension Verbalized understanding            SLP Long Term Goals - 11/29/16 1252      SLP LONG TERM GOAL #1   Title Patient will complete semantic feature word finding tasks with 80% accuracy.   Time 12   Period Weeks   Status New     SLP LONG TERM GOAL #2   Title Patient will generate grammatical and cogent sentence to complete simple/concrete linguistic task with 80% accuracy.   Time 12   Period Weeks   Status New     SLP LONG TERM GOAL #3   Title Patient complete functional reading tasks with 80% accuracy.   Time 12   Period Weeks   Status New     SLP LONG TERM GOAL #4   Title Patient complete functional writing tasks with 80% accuracy.   Time 12   Period Weeks   Status New          Plan - 01/31/17 1326  Clinical Impression Statement Patient able to participate in simple word finding activities, including naming specific parts of speech and pictured verbs. Her language is frequently vague/empty, but responds to cueing to improve specificity.      Speech Therapy Frequency 2x / week   Duration Other (comment)   Treatment/Interventions Language facilitation;Cueing hierarchy;Compensatory techniques;Multimodal communcation approach;SLP instruction and feedback;Patient/family education   Potential to Achieve Goals Good   Potential Considerations Ability to learn/carryover information;Co-morbidities;Cooperation/participation level;Medical prognosis;Previous level of function;Severity of impairments;Family/community support   Consulted and Agree with Plan of Care Patient;Other (Comment)   Family Member Consulted Friend       Patient will benefit from skilled therapeutic intervention in order to improve the following deficits and impairments:   Aphasia    Problem List Patient Active Problem List   Diagnosis Date Noted  . Breast nodule 12/24/2015  . Headache 12/24/2015  . Health care maintenance 06/18/2015  . Environmental allergies 06/18/2015  . Toenail fungus 07/31/2014  . Weight loss 03/28/2014  . History of colonic polyps 10/28/2013  . Knee pain 09/09/2013  . Skin lesion 09/09/2013  . Memory change 06/18/2013  . Hand numbness 06/18/2013  . Fullness of breast 06/16/2013  . Thyroid nodule 01/13/2013   Leroy Sea, MS/CCC- SLP  [name] 01/31/2017, 1:27 PM  [place] MAIN [facility] SERVICES [address] [place], [place], [address] Phone: [phone]   Fax:  [phone]   Name: Carol Beard MRN: [id number] Date of Birth: June 17, 1946

## 2017-02-03 ENCOUNTER — Encounter: Payer: Self-pay | Admitting: Speech Pathology

## 2017-02-03 ENCOUNTER — Ambulatory Visit: Payer: Medicare Other | Admitting: Speech Pathology

## 2017-02-03 ENCOUNTER — Telehealth: Payer: Self-pay | Admitting: Internal Medicine

## 2017-02-03 DIAGNOSIS — R4701 Aphasia: Secondary | ICD-10-CM

## 2017-02-03 NOTE — Therapy (Signed)
[place] MAIN [facility] SERVICES [address] [place], [place], [address] Phone: [phone]   Fax:  [phone]  Speech Language Pathology Treatment  Patient Details  Name: Carol Beard MRN: [id number] Date of Birth: Aug 24, 1946 Referring Provider: Vladimir Crofts   Encounter Date: 02/03/2017      End of Session - 02/03/17 1225    Visit Number 8   Number of Visits 25   Date for SLP Re-Evaluation 02/27/17   SLP Start Time 1100   SLP Stop Time  1156   SLP Time Calculation (min) 56 min   Activity Tolerance Patient tolerated treatment well      Past Medical History:  Diagnosis Date  . Environmental allergies   . Thyroid nodule    biopsy x 2 negative    Past Surgical History:  Procedure Laterality Date  . BREAST BIOPSY Left 1971   fibroadenoma, left breast  . TONSILLECTOMY      There were no vitals filed for this visit.      Subjective Assessment - 02/03/17 1225    Subjective "I'm so embarassed" regarding errors in naming task   Currently in Pain? No/denies               ADULT SLP TREATMENT - 02/03/17 0001      General Information   Behavior/Cognition Alert;Cooperative;Pleasant mood;Distractible     Treatment Provided   Treatment provided Cognitive-Linquistic     Pain Assessment   Pain Assessment No/denies pain     Cognitive-Linquistic Treatment   Treatment focused on Aphasia   Skilled Treatment WORD FINDING:  Name item given definition with 20% accuracy independently and 80% given SLP cues.  Patient cannot independently self-cue by gesturing or circumlocution, although she benefits from these cues.  Name pictured actions with 60% accuracy independently and 100% given min SLP cues.  READING COMPREHENSION:  follow 1-step, 2 component written directions with 40% accuracy independently and 80% given mod-max cues.     Assessment / Recommendations / Plan   Plan Continue with current plan of care     Progression  Toward Goals   Progression toward goals Progressing toward goals          SLP Education - 02/03/17 1225    Education provided Yes   Education Details Need to tolerate the struggle/embarassment in order to maintain optimal functional communication   Person(s) Educated Patient;Other (comment)   Methods Explanation   Comprehension Verbalized understanding            SLP Long Term Goals - 11/29/16 1252      SLP LONG TERM GOAL #1   Title Patient will complete semantic feature word finding tasks with 80% accuracy.   Time 12   Period Weeks   Status New     SLP LONG TERM GOAL #2   Title Patient will generate grammatical and cogent sentence to complete simple/concrete linguistic task with 80% accuracy.   Time 12   Period Weeks   Status New     SLP LONG TERM GOAL #3   Title Patient complete functional reading tasks with 80% accuracy.   Time 12   Period Weeks   Status New     SLP LONG TERM GOAL #4   Title Patient complete functional writing tasks with 80% accuracy.   Time 12   Period Weeks   Status New          Plan - 02/03/17 1226    Clinical Impression Statement Patient  able to participate in simple word finding activities, including naming specific parts of speech and pictured verbs. Her language is frequently vague/empty, but responds to cueing to improve specificity.      Speech Therapy Frequency 2x / week   Duration Other (comment)   Treatment/Interventions Language facilitation;Cueing hierarchy;Compensatory techniques;Multimodal communcation approach;SLP instruction and feedback;Patient/family education   Potential to Achieve Goals Good   Potential Considerations Ability to learn/carryover information;Co-morbidities;Cooperation/participation level;Medical prognosis;Previous level of function;Severity of impairments;Family/community support   Consulted and Agree with Plan of Care Patient;Other (Comment)   Family Member Consulted Friend      Patient will  benefit from skilled therapeutic intervention in order to improve the following deficits and impairments:   Aphasia    Problem List Patient Active Problem List   Diagnosis Date Noted  . Breast nodule 12/24/2015  . Headache 12/24/2015  . Health care maintenance 06/18/2015  . Environmental allergies 06/18/2015  . Toenail fungus 07/31/2014  . Weight loss 03/28/2014  . History of colonic polyps 10/28/2013  . Knee pain 09/09/2013  . Skin lesion 09/09/2013  . Memory change 06/18/2013  . Hand numbness 06/18/2013  . Fullness of breast 06/16/2013  . Thyroid nodule 01/13/2013   Leroy Sea, MS/CCC- SLP  [name] 02/03/2017, 12:26 PM  [place] MAIN [facility] SERVICES [address] [place], [place], [address] Phone: [phone]   Fax:  [phone]   Name: SHAYLIE ESSENMACHER MRN: [id number] Date of Birth: 1946/07/24

## 2017-02-03 NOTE — Telephone Encounter (Signed)
I called pt and left a vm for pt to call the office to sch AWV. Thank you!

## 2017-02-07 ENCOUNTER — Ambulatory Visit: Payer: Medicare Other | Admitting: Speech Pathology

## 2017-02-07 ENCOUNTER — Encounter: Payer: Self-pay | Admitting: Speech Pathology

## 2017-02-07 DIAGNOSIS — R4701 Aphasia: Secondary | ICD-10-CM

## 2017-02-07 NOTE — Therapy (Signed)
[place] MAIN [facility] SERVICES [address] [place], [place], [address] Phone: [phone]   Fax:  [phone]  Speech Language Pathology Treatment  Patient Details  Name: Carol Beard MRN: [id number] Date of Birth: [birth year] Referring Provider: Vladimir Crofts   Encounter Date: 02/07/2017      End of Session - 02/07/17 1247    Visit Number 9   Number of Visits 25   Date for SLP Re-Evaluation 02/27/17   SLP Start Time 72   SLP Stop Time  1200   SLP Time Calculation (min) 60 min   Activity Tolerance Patient tolerated treatment well      Past Medical History:  Diagnosis Date  . Environmental allergies   . Thyroid nodule    biopsy x 2 negative    Past Surgical History:  Procedure Laterality Date  . BREAST BIOPSY Left 1971   fibroadenoma, left breast  . TONSILLECTOMY      There were no vitals filed for this visit.      Subjective Assessment - 02/07/17 1247    Subjective "I'm so embarassed" regarding errors    Currently in Pain? No/denies               ADULT SLP TREATMENT - 02/07/17 0001      General Information   Behavior/Cognition Alert;Cooperative;Pleasant mood;Distractible     Treatment Provided   Treatment provided Cognitive-Linquistic     Pain Assessment   Pain Assessment No/denies pain     Cognitive-Linquistic Treatment   Treatment focused on Aphasia   Skilled Treatment WORD FINDING:  Name pictured objects with 80% accuracy independently and 100% given multiple semantic feature cues.  READING COMPREHENSION:  follow 1-step, 2 component written directions with 40% accuracy independently and 80% given mod-max cues. NON-VERBAL COMMUNICATION: generate gesture to convey (easy) concept with max cues.  Identify/state (easy) concept given gestures with 70% accuracy.     Assessment / Recommendations / Plan   Plan Continue with current plan of care     Progression Toward Goals   Progression toward goals  Progressing toward goals          SLP Education - 02/07/17 1247    Education provided Yes   Education Details Non-verbal communication   Person(s) Educated Patient;Other (comment)   Methods Explanation   Comprehension Verbalized understanding            SLP Long Term Goals - 11/29/16 1252      SLP LONG TERM GOAL #1   Title Patient will complete semantic feature word finding tasks with 80% accuracy.   Time 12   Period Weeks   Status New     SLP LONG TERM GOAL #2   Title Patient will generate grammatical and cogent sentence to complete simple/concrete linguistic task with 80% accuracy.   Time 12   Period Weeks   Status New     SLP LONG TERM GOAL #3   Title Patient complete functional reading tasks with 80% accuracy.   Time 12   Period Weeks   Status New     SLP LONG TERM GOAL #4   Title Patient complete functional writing tasks with 80% accuracy.   Time 12   Period Weeks   Status New        Patient will benefit from skilled therapeutic intervention in order to improve the following deficits and impairments:   Aphasia    Problem List Patient Active Problem List   Diagnosis Date Noted  .  Breast nodule 12/24/2015  . Headache 12/24/2015  . Health care maintenance 06/18/2015  . Environmental allergies 06/18/2015  . Toenail fungus 07/31/2014  . Weight loss 03/28/2014  . History of colonic polyps 10/28/2013  . Knee pain 09/09/2013  . Skin lesion 09/09/2013  . Memory change 06/18/2013  . Hand numbness 06/18/2013  . Fullness of breast 06/16/2013  . Thyroid nodule 01/13/2013   Leroy Sea, MS/CCC- SLP  [name] 02/07/2017, 12:48 PM  [place] MAIN [facility] SERVICES [address] [place], [place], [address] Phone: [phone]   Fax:  [phone]   Name: ARSHDEEP ARRIAZA MRN: [id number] Date of Birth: August 10, 1946

## 2017-02-10 ENCOUNTER — Encounter: Payer: No Typology Code available for payment source | Admitting: Speech Pathology

## 2017-02-12 ENCOUNTER — Encounter: Payer: No Typology Code available for payment source | Admitting: Speech Pathology

## 2017-02-14 ENCOUNTER — Encounter: Payer: Self-pay | Admitting: Speech Pathology

## 2017-02-14 ENCOUNTER — Ambulatory Visit: Payer: Medicare Other | Attending: Neurology | Admitting: Speech Pathology

## 2017-02-14 DIAGNOSIS — R4701 Aphasia: Secondary | ICD-10-CM | POA: Diagnosis not present

## 2017-02-14 NOTE — Therapy (Signed)
[place] MAIN [facility] SERVICES [address] [place], [place], [address] Phone: [phone]   Fax:  [phone]  Speech Language Pathology Treatment/Progress Note  Patient Details  Name: Carol Beard MRN: [id number] Date of Birth: Dec 01, 1946 Referring Provider: Vladimir Crofts   Encounter Date: 02/14/2017      End of Session - 02/14/17 1259    Visit Number 10   Number of Visits 25   Date for SLP Re-Evaluation 02/27/17   SLP Start Time 70   SLP Stop Time  1200   SLP Time Calculation (min) 60 min   Activity Tolerance Patient tolerated treatment well      Past Medical History:  Diagnosis Date  . Environmental allergies   . Thyroid nodule    biopsy x 2 negative    Past Surgical History:  Procedure Laterality Date  . BREAST BIOPSY Left 1971   fibroadenoma, left breast  . TONSILLECTOMY      There were no vitals filed for this visit.      Subjective Assessment - 02/14/17 1257    Subjective Patient able to independently, albeit with delay, express 3 novel questions/information   Currently in Pain? No/denies               ADULT SLP TREATMENT - 02/14/17 0001      General Information   Behavior/Cognition Alert;Cooperative;Pleasant mood;Distractible     Treatment Provided   Treatment provided Cognitive-Linquistic     Pain Assessment   Pain Assessment No/denies pain     Cognitive-Linquistic Treatment   Treatment focused on Aphasia   Skilled Treatment WORD FINDING/VERBAL EXPRESSION:  Name objects with 80% accuracy independently and 100% given multiple semantic feature cues.  Generate meaningful phrase to describe simple action pictures with 70% accuracy.  READING COMPREHENSION:  follow 1-step, 2 component written directions with 40% accuracy independently. NON-VERBAL COMMUNICATION: Cannot generate gesture to convey (easy) concept despite max cues.  Identify/state (easy) concept given gestures with 90% accuracy.     Assessment / Recommendations / Plan   Plan Continue with current plan of care     Progression Toward Goals   Progression toward goals Progressing toward goals          SLP Education - 02/14/17 1258    Education provided Yes   Education Details Non-verbal communication   Person(s) Educated Patient;Other (comment)   Methods Explanation   Comprehension Verbalized understanding            SLP Long Term Goals - 02/14/17 1300      SLP LONG TERM GOAL #1   Title Patient will complete semantic feature word finding tasks with 80% accuracy.   Time 12   Period Weeks   Status Partially Met     SLP LONG TERM GOAL #2   Title Patient will generate grammatical and cogent sentence to complete simple/concrete linguistic task with 80% accuracy.   Time 12   Period Weeks   Status Partially Met     SLP LONG TERM GOAL #3   Title Patient complete functional reading tasks with 80% accuracy.   Time 12   Period Weeks   Status Partially Met     SLP LONG TERM GOAL #4   Title Patient complete functional writing tasks with 80% accuracy.   Time 12   Period Weeks   Status Deferred          Plan - 02/14/17 1259    Clinical Impression Statement Patient able to participate in  simple word finding activities, including naming specific parts of speech and pictured verbs. Her language is frequently vague/empty, but responds to cueing to improve specificity.   Patient demonstrated improved reading comprehension and less vague language during structured task.  Patient continues to have difficulty with the concept of non-verbal communication.   Speech Therapy Frequency 2x / week   Duration Other (comment)   Treatment/Interventions Language facilitation;Cueing hierarchy;Compensatory techniques;Multimodal communcation approach;SLP instruction and feedback;Patient/family education   Potential to Achieve Goals Good   Potential Considerations Ability to learn/carryover  information;Co-morbidities;Cooperation/participation level;Medical prognosis;Previous level of function;Severity of impairments;Family/community support   Consulted and Agree with Plan of Care Patient;Other (Comment)   Family Member Consulted Friend      Patient will benefit from skilled therapeutic intervention in order to improve the following deficits and impairments:   Aphasia      G-Codes - 03-10-17 1301    Functional Assessment Tool Used clinical judgment, therapeutic activities   Functional Limitations Spoken language expressive   Spoken Language Expression Current Status [phone]) At least 60 percent but less than 80 percent impaired, limited or restricted   Spoken Language Expression Goal Status ([id number]) At least 20 percent but less than 40 percent impaired, limited or restricted      Problem List Patient Active Problem List   Diagnosis Date Noted  . Breast nodule 12/24/2015  . Headache 12/24/2015  . Health care maintenance 06/18/2015  . Environmental allergies 06/18/2015  . Toenail fungus 07/31/2014  . Weight loss 03/28/2014  . History of colonic polyps 10/28/2013  . Knee pain 09/09/2013  . Skin lesion 09/09/2013  . Memory change 06/18/2013  . Hand numbness 06/18/2013  . Fullness of breast 06/16/2013  . Thyroid nodule 01/13/2013   Leroy Sea, MS/CCC- SLP  [name] 2017-03-10, 1:02 PM  [place] MAIN [facility] SERVICES [address] [place], [place], [address] Phone: [phone]   Fax:  [phone]   Name: ELINDA BUNTEN MRN: [id number] Date of Birth: [birth year]

## 2017-02-17 ENCOUNTER — Encounter: Payer: Self-pay | Admitting: Speech Pathology

## 2017-02-17 ENCOUNTER — Ambulatory Visit: Payer: Medicare Other | Admitting: Speech Pathology

## 2017-02-17 DIAGNOSIS — R4701 Aphasia: Secondary | ICD-10-CM | POA: Diagnosis not present

## 2017-02-17 NOTE — Therapy (Signed)
[place] MAIN [facility] SERVICES [address] [place], [place], [address] Phone: [phone]   Fax:  [phone]  Speech Language Pathology Treatment  Patient Details  Name: Carol Beard MRN: [id number] Date of Birth: [birth year] Referring Provider: Vladimir Crofts   Encounter Date: 02/17/2017      End of Session - 02/17/17 1226    Visit Number 11   Number of Visits 25   Date for SLP Re-Evaluation 02/27/17   SLP Start Time 55   SLP Stop Time  1146   SLP Time Calculation (min) 56 min   Activity Tolerance Patient tolerated treatment well      Past Medical History:  Diagnosis Date  . Environmental allergies   . Thyroid nodule    biopsy x 2 negative    Past Surgical History:  Procedure Laterality Date  . BREAST BIOPSY Left 1971   fibroadenoma, left breast  . TONSILLECTOMY      There were no vitals filed for this visit.      Subjective Assessment - 02/17/17 1225    Subjective Patient able to independently, albeit with delay, express novel thought/information   Currently in Pain? No/denies               ADULT SLP TREATMENT - 02/17/17 0001      General Information   Behavior/Cognition Alert;Cooperative;Pleasant mood;Distractible     Treatment Provided   Treatment provided Cognitive-Linquistic     Pain Assessment   Pain Assessment No/denies pain     Cognitive-Linquistic Treatment   Treatment focused on Aphasia   Skilled Treatment accuracy and state reason with 70% accuracy.     Assessment / Recommendations / Plan   Plan Continue with current plan of care     Progression Toward Goals   Progression toward goals Progressing toward goals          SLP Education - 02/17/17 1226    Education provided Yes   Education Details Use any means possible to convey idea   Person(s) Educated Patient;Other (comment)   Methods Explanation   Comprehension Verbalized understanding            SLP Long Term Goals  - 02/14/17 1300      SLP LONG TERM GOAL #1   Title Patient will complete semantic feature word finding tasks with 80% accuracy.   Time 12   Period Weeks   Status Partially Met     SLP LONG TERM GOAL #2   Title Patient will generate grammatical and cogent sentence to complete simple/concrete linguistic task with 80% accuracy.   Time 12   Period Weeks   Status Partially Met     SLP LONG TERM GOAL #3   Title Patient complete functional reading tasks with 80% accuracy.   Time 12   Period Weeks   Status Partially Met     SLP LONG TERM GOAL #4   Title Patient complete functional writing tasks with 80% accuracy.   Time 12   Period Weeks   Status Deferred          Plan - 02/17/17 1226    Clinical Impression Statement Patient able to participate in simple word finding activities, including naming specific parts of speech and pictured verbs. Her language, during structured language tasks, is much less vague/empty. She continues to perseverate on trying to use only the "perfect" word vs. multi-modal approach to convey her ideas.   Patient demonstrated improved reading comprehension.  Patient continues to  have difficulty with the concept of non-verbal communication.   Speech Therapy Frequency 2x / week   Duration Other (comment)   Treatment/Interventions Language facilitation;Cueing hierarchy;Compensatory techniques;Multimodal communcation approach;SLP instruction and feedback;Patient/family education   Potential to Achieve Goals Good   Potential Considerations Ability to learn/carryover information;Co-morbidities;Cooperation/participation level;Medical prognosis;Previous level of function;Severity of impairments;Family/community support   Consulted and Agree with Plan of Care Patient;Other (Comment)   Family Member Consulted Friend      Patient will benefit from skilled therapeutic intervention in order to improve the following deficits and impairments:   Aphasia    Problem  List Patient Active Problem List   Diagnosis Date Noted  . Breast nodule 12/24/2015  . Headache 12/24/2015  . Health care maintenance 06/18/2015  . Environmental allergies 06/18/2015  . Toenail fungus 07/31/2014  . Weight loss 03/28/2014  . History of colonic polyps 10/28/2013  . Knee pain 09/09/2013  . Skin lesion 09/09/2013  . Memory change 06/18/2013  . Hand numbness 06/18/2013  . Fullness of breast 06/16/2013  . Thyroid nodule 01/13/2013   Leroy Sea, MS/CCC- SLP  Carol Beard 02/17/2017, 12:27 PM  [place] MAIN [facility] SERVICES [address] [place], [place], [address] Phone: [phone]   Fax:  [phone]   Name: Carol Beard MRN: [id number] Date of Birth: 11-16-1946

## 2017-02-18 ENCOUNTER — Telehealth: Payer: Self-pay | Admitting: Internal Medicine

## 2017-02-18 NOTE — Telephone Encounter (Signed)
Left pt message asking to call [name] back directly at [phone] to schedule AWV. Thanks! °

## 2017-02-19 ENCOUNTER — Encounter: Payer: No Typology Code available for payment source | Admitting: Speech Pathology

## 2017-02-20 ENCOUNTER — Encounter: Payer: Self-pay | Admitting: Speech Pathology

## 2017-02-20 ENCOUNTER — Ambulatory Visit: Payer: Medicare Other | Admitting: Speech Pathology

## 2017-02-20 DIAGNOSIS — R4701 Aphasia: Secondary | ICD-10-CM

## 2017-02-20 NOTE — Therapy (Signed)
[place] MAIN [facility] SERVICES [address] [place], [place], [address] Phone: [phone]   Fax:  [phone]  Speech Language Pathology Treatment  Patient Details  Name: Carol Beard MRN: [id number] Date of Birth: 1946-07-02 Referring Provider: Jennings Books K   Encounter Date: 02/20/2017      End of Session - 02/20/17 1425    Visit Number 12   Number of Visits 25   Date for SLP Re-Evaluation 02/27/17   SLP Start Time 76   SLP Stop Time  1200   SLP Time Calculation (min) 60 min   Activity Tolerance Patient tolerated treatment well      Past Medical History:  Diagnosis Date  . Environmental allergies   . Thyroid nodule    biopsy x 2 negative    Past Surgical History:  Procedure Laterality Date  . BREAST BIOPSY Left 1971   fibroadenoma, left breast  . TONSILLECTOMY      There were no vitals filed for this visit.      Subjective Assessment - 02/20/17 1424    Subjective Patient able to independently, albeit with delay, express novel thought/information   Currently in Pain? No/denies               ADULT SLP TREATMENT - 02/20/17 0001      General Information   Behavior/Cognition Alert;Cooperative;Pleasant mood;Distractible     Treatment Provided   Treatment provided Cognitive-Linquistic     Pain Assessment   Pain Assessment No/denies pain     Cognitive-Linquistic Treatment   Treatment focused on Aphasia   Skilled Treatment WORD FINDING/VERBAL EXPRESSION:  Name objects with 80% accuracy independently and 100% given multiple semantic feature cues.  Generate meaningful phrase to describe simple action pictures with 70% accuracy.  READING COMPREHENSION:  follow familiar 1-step, 2 component written directions with 100% accuracy independently; when the format slightly changes, the patient requires max cues to comprehend novel concepts.  NON-VERBAL COMMUNICATION: Cannot generate gesture to convey (easy) concept  despite max cues.  Identify/state (easy) concept given gestures with 90% accuracy.     Assessment / Recommendations / Plan   Plan Continue with current plan of care     Progression Toward Goals   Progression toward goals Progressing toward goals          SLP Education - 02/20/17 1424    Education provided Yes   Education Details Use any means possible to convey ideas   Person(s) Educated Patient;Other (comment)  Friend   Methods Explanation   Comprehension Verbalized understanding            SLP Long Term Goals - 02/14/17 1300      SLP LONG TERM GOAL #1   Title Patient will complete semantic feature word finding tasks with 80% accuracy.   Time 12   Period Weeks   Status Partially Met     SLP LONG TERM GOAL #2   Title Patient will generate grammatical and cogent sentence to complete simple/concrete linguistic task with 80% accuracy.   Time 12   Period Weeks   Status Partially Met     SLP LONG TERM GOAL #3   Title Patient complete functional reading tasks with 80% accuracy.   Time 12   Period Weeks   Status Partially Met     SLP LONG TERM GOAL #4   Title Patient complete functional writing tasks with 80% accuracy.   Time 12   Period Weeks   Status Deferred  Plan - 02/20/17 1425    Clinical Impression Statement Patient able to participate in simple word finding activities, including naming specific parts of speech and pictured verbs. Her language, during structured language tasks, is much less vague/empty. She continues to perseverate on trying to use only the "perfect" word vs. multi-modal approach to convey her ideas.   Patient demonstrated improved reading comprehension.  Patient continues to have difficulty with the concept of non-verbal communication. She is exhibiting visual perceptual difficulties.    Speech Therapy Frequency 2x / week   Duration Other (comment)   Treatment/Interventions Language facilitation;Cueing hierarchy;Compensatory  techniques;Multimodal communcation approach;SLP instruction and feedback;Patient/family education   Potential to Achieve Goals Good   Potential Considerations Ability to learn/carryover information;Co-morbidities;Cooperation/participation level;Medical prognosis;Previous level of function;Severity of impairments;Family/community support   Consulted and Agree with Plan of Care Patient;Other (Comment)   Family Member Consulted Friend      Patient will benefit from skilled therapeutic intervention in order to improve the following deficits and impairments:   Aphasia    Problem List Patient Active Problem List   Diagnosis Date Noted  . Breast nodule 12/24/2015  . Headache 12/24/2015  . Health care maintenance 06/18/2015  . Environmental allergies 06/18/2015  . Toenail fungus 07/31/2014  . Weight loss 03/28/2014  . History of colonic polyps 10/28/2013  . Knee pain 09/09/2013  . Skin lesion 09/09/2013  . Memory change 06/18/2013  . Hand numbness 06/18/2013  . Fullness of breast 06/16/2013  . Thyroid nodule 01/13/2013   Leroy Sea, MS/CCC- SLP  [name] 02/20/2017, 2:26 PM  [place] MAIN [facility] SERVICES [address] [place], [place], [address] Phone: [phone]   Fax:  [phone]   Name: Carol Beard MRN: [id number] Date of Birth: June 08, 1946

## 2017-02-24 ENCOUNTER — Ambulatory Visit: Payer: Medicare Other | Admitting: Speech Pathology

## 2017-02-26 ENCOUNTER — Encounter: Payer: No Typology Code available for payment source | Admitting: Speech Pathology

## 2017-02-27 ENCOUNTER — Ambulatory Visit: Payer: Medicare Other | Admitting: Speech Pathology

## 2017-02-27 ENCOUNTER — Encounter: Payer: Self-pay | Admitting: Speech Pathology

## 2017-02-27 DIAGNOSIS — R4701 Aphasia: Secondary | ICD-10-CM

## 2017-02-27 NOTE — Therapy (Signed)
[place] MAIN [facility] SERVICES [address] [place], [place], [address] Phone: [phone]   Fax:  [phone]  Speech Language Pathology Treatment/Re-Certification   Patient Details  Name: Carol Beard MRN: [id number] Date of Birth: 1946/05/08 Referring Provider: Jennings Books K   Encounter Date: 02/27/2017      End of Session - 02/27/17 1443    Visit Number 13   Number of Visits 25   Date for SLP Re-Evaluation 04/29/17   SLP Start Time 33   SLP Stop Time  1200   SLP Time Calculation (min) 60 min   Activity Tolerance Patient tolerated treatment well      Past Medical History:  Diagnosis Date  . Environmental allergies   . Thyroid nodule    biopsy x 2 negative    Past Surgical History:  Procedure Laterality Date  . BREAST BIOPSY Left 1971   fibroadenoma, left breast  . TONSILLECTOMY      There were no vitals filed for this visit.      Subjective Assessment - 02/27/17 1440    Subjective Patient able to independently, albeit with delay, express novel thought/information   Currently in Pain? No/denies               ADULT SLP TREATMENT - 02/27/17 0001      General Information   Behavior/Cognition Alert;Cooperative;Pleasant mood;Distractible     Treatment Provided   Treatment provided Cognitive-Linquistic     Pain Assessment   Pain Assessment No/denies pain     Cognitive-Linquistic Treatment   Treatment focused on Aphasia   Skilled Treatment SPEECH EXERCISE PROGRAM: Patient given written home exercise program.  Reviewed all portions with patient and friend.  Program includes: generating meaningful sentences and word finding tasks (associations, action words, descriptive words, listing items in a category, naming category given exemplars, and part-to-whole).     Assessment / Recommendations / Plan   Plan Continue with current plan of care     Progression Toward Goals   Progression toward goals  Progressing toward goals          SLP Education - 02/27/17 1440    Education provided Yes   Education Details use any means possible to convey ideas   Person(s) Educated Patient;Other (comment)   Methods Explanation;Handout   Comprehension Verbalized understanding;Verbal cues required            SLP Long Term Goals - 02/27/17 1445      SLP LONG TERM GOAL #1   Title Patient will complete semantic feature word finding tasks with 80% accuracy.   Time 8   Period Weeks   Status Partially Met     SLP LONG TERM GOAL #2   Title Patient will generate grammatical and cogent sentence to complete simple/concrete linguistic task with 80% accuracy.   Time 8   Period Weeks   Status Partially Met     SLP LONG TERM GOAL #3   Title Patient complete functional reading tasks with 80% accuracy.   Time 8   Period Weeks   Status Partially Met     SLP LONG TERM GOAL #4   Title Patient complete functional writing tasks with 80% accuracy.   Period Weeks   Status Deferred          Plan - 02/27/17 1444    Clinical Impression Statement Patient able to participate in simple word finding activities, including naming specific parts of speech and pictured verbs. Her language, during structured language  tasks, is much less vague/empty. She continues to perseverate on trying to use only the "perfect" word vs. multi-modal approach to convey her ideas.   Patient demonstrates improved reading comprehension.  Patient continues to have difficulty with the concept of non-verbal communication. She is exhibiting visual perceptual difficulties. The patient has missed many appointments due to conflicts, illness, and weather.  Will plan to extend speech therapy to insure patient and her friend/family know how to stimulate improved communication and aid in ongoing exercise to maintain skills.   Speech Therapy Frequency 1x /week   Duration Other (comment)  8 weeks   Treatment/Interventions Language  facilitation;Cueing hierarchy;Compensatory techniques;Multimodal communcation approach;SLP instruction and feedback;Patient/family education   Potential to Achieve Goals Good   Potential Considerations Ability to learn/carryover information;Co-morbidities;Cooperation/participation level;Medical prognosis;Previous level of function;Severity of impairments;Family/community support   SLP Home Exercise Plan Speech exercise program   Consulted and Agree with Plan of Care Patient;Other (Comment)   Family Member Consulted Friend      Patient will benefit from skilled therapeutic intervention in order to improve the following deficits and impairments:   Aphasia - Plan: SLP plan of care cert/re-cert    Problem List Patient Active Problem List   Diagnosis Date Noted  . Breast nodule 12/24/2015  . Headache 12/24/2015  . Health care maintenance 06/18/2015  . Environmental allergies 06/18/2015  . Toenail fungus 07/31/2014  . Weight loss 03/28/2014  . History of colonic polyps 10/28/2013  . Knee pain 09/09/2013  . Skin lesion 09/09/2013  . Memory change 06/18/2013  . Hand numbness 06/18/2013  . Fullness of breast 06/16/2013  . Thyroid nodule 01/13/2013   Leroy Sea, MS/CCC- SLP  [name] 02/27/2017, 2:48 PM  [place] MAIN [facility] SERVICES [address] [place], [place], [address] Phone: [phone]   Fax:  [phone]   Name: AMYJO MIZRACHI MRN: [id number] Date of Birth: 31-Mar-1946

## 2017-03-05 ENCOUNTER — Ambulatory Visit: Payer: Medicare Other | Admitting: Speech Pathology

## 2017-03-12 ENCOUNTER — Ambulatory Visit: Payer: Medicare Other | Admitting: Speech Pathology

## 2017-03-12 DIAGNOSIS — R4701 Aphasia: Secondary | ICD-10-CM | POA: Diagnosis not present

## 2017-03-13 ENCOUNTER — Encounter: Payer: Self-pay | Admitting: Speech Pathology

## 2017-03-13 NOTE — Therapy (Signed)
[place] MAIN [facility] SERVICES [address] [place], [place], [address] Phone: [phone]   Fax:  [phone]  Speech Language Pathology Treatment/Discharge Summary  Patient Details  Name: Carol Beard MRN: [id number] Date of Birth: 02-Dec-1946 Referring Provider: Jennings Books K   Encounter Date: 03/12/2017      End of Session - 03/13/17 0942    Visit Number 14   Number of Visits 25   Date for SLP Re-Evaluation 04/29/17   SLP Start Time 39   SLP Stop Time  1657   SLP Time Calculation (min) 57 min   Activity Tolerance Patient tolerated treatment well      Past Medical History:  Diagnosis Date  . Environmental allergies   . Thyroid nodule    biopsy x 2 negative    Past Surgical History:  Procedure Laterality Date  . BREAST BIOPSY Left 1971   fibroadenoma, left breast  . TONSILLECTOMY      There were no vitals filed for this visit.      Subjective Assessment - 03/13/17 0941    Subjective Patient able to independently, albeit with delay, express novel thought/information   Currently in Pain? No/denies               ADULT SLP TREATMENT - 03/13/17 0001      General Information   Behavior/Cognition Alert;Cooperative;Pleasant mood;Distractible     Treatment Provided   Treatment provided Cognitive-Linquistic     Pain Assessment   Pain Assessment No/denies pain     Cognitive-Linquistic Treatment   Treatment focused on Aphasia   Skilled Treatment SPEECH EXERCISE PROGRAM: Patient given written home exercise program.  Reviewed all portions with patient and friend.  Program includes: generating meaningful sentences and word finding tasks (associations, action words, descriptive words, listing items in a category, naming category given exemplars, and part-to-whole).  The patient is able to generate meaningful sentences to describe pictures with 75% accuracy, improves given min-to-mod cues to clarify vague utterances.      Assessment / Recommendations / Plan   Plan Discharge SLP treatment due to (comment);All goals met     Progression Toward Goals   Progression toward goals Goals met, education completed, patient discharged from [facility] - 03/13/17 0941    Education provided Yes   Education Details use any means possible to convey ideas   Person(s) Educated Patient;Other (comment)   Methods Explanation;Handout   Comprehension Verbalized understanding;Returned demonstration            SLP Long Term Goals - 03/13/17 0943      SLP LONG TERM GOAL #1   Title Patient will complete semantic feature word finding tasks with 80% accuracy.   Time 8   Period Weeks   Status Partially Met     SLP LONG TERM GOAL #2   Title Patient will generate grammatical and cogent sentence to complete simple/concrete linguistic task with 80% accuracy.   Time 8   Period Weeks   Status Achieved     SLP LONG TERM GOAL #3   Title Patient complete functional reading tasks with 80% accuracy.   Time 8   Period Weeks   Status Partially Met     SLP LONG TERM GOAL #4   Title Patient complete functional writing tasks with 80% accuracy.   Time 12   Period Weeks   Status Deferred          Plan -  2017/04/05 1438    Clinical Impression Statement Patient able to participate in simple word finding activities, including naming specific parts of speech and pictured verbs. Her language, during structured language tasks, is much less vague/empty. She continues to perseverate on trying to use only the "perfect" word vs. multi-modal approach to convey her ideas.   She was much more receptive this concept today.  Patient demonstrates improved reading comprehension.  Patient continues to have difficulty with the concept of non-verbal communication. She is exhibiting visual perceptual difficulties. The patient has a home exercise program to stimulate improved communication and aid in ongoing exercise to maintain  skills.  The patient states she is ready for discharge.     Speech Therapy Frequency Other (comment)  Discharge   Treatment/Interventions Language facilitation;Cueing hierarchy;Compensatory techniques;Multimodal communcation approach;SLP instruction and feedback;Patient/family education   Potential to Achieve Goals Good   Potential Considerations Ability to learn/carryover information;Co-morbidities;Cooperation/participation level;Medical prognosis;Previous level of function;Severity of impairments;Family/community support   SLP Home Exercise Plan Speech exercise program   Consulted and Agree with Plan of Care Patient;Other (Comment)   Family Member Consulted Friend      Patient will benefit from skilled therapeutic intervention in order to improve the following deficits and impairments:   Aphasia      G-Codes - Apr 05, 2017 0944    Functional Assessment Tool Used clinical judgment, therapeutic activities   Functional Limitations Spoken language expressive   Spoken Language Expression Current Status [phone]) At least 40 percent but less than 60 percent impaired, limited or restricted   Spoken Language Expression Goal Status ([id number]) At least 20 percent but less than 40 percent impaired, limited or restricted      Problem List Patient Active Problem List   Diagnosis Date Noted  . Breast nodule 12/24/2015  . Headache 12/24/2015  . Health care maintenance 06/18/2015  . Environmental allergies 06/18/2015  . Toenail fungus 07/31/2014  . Weight loss 03/28/2014  . History of colonic polyps 10/28/2013  . Knee pain 09/09/2013  . Skin lesion 09/09/2013  . Memory change 06/18/2013  . Hand numbness 06/18/2013  . Fullness of breast 06/16/2013  . Thyroid nodule 01/13/2013   Leroy Sea, MS/CCC- SLP  [name] 04-05-17, 9:44 AM  [place] MAIN [name] SERVICES [address] [place], [place], [address] Phone: [phone]   Fax:   [phone]   Name: Carol Beard MRN: [id number] Date of Birth: 1946-07-17

## 2017-03-20 DIAGNOSIS — E041 Nontoxic single thyroid nodule: Secondary | ICD-10-CM | POA: Diagnosis not present

## 2017-03-20 DIAGNOSIS — J301 Allergic rhinitis due to pollen: Secondary | ICD-10-CM | POA: Diagnosis not present

## 2017-03-21 ENCOUNTER — Encounter: Payer: No Typology Code available for payment source | Admitting: Speech Pathology

## 2017-04-02 ENCOUNTER — Ambulatory Visit: Payer: No Typology Code available for payment source | Admitting: Internal Medicine

## 2017-04-04 ENCOUNTER — Encounter: Payer: No Typology Code available for payment source | Admitting: Speech Pathology

## 2017-04-08 NOTE — Telephone Encounter (Signed)
Pt declined AWV. °

## 2017-04-11 ENCOUNTER — Ambulatory Visit (INDEPENDENT_AMBULATORY_CARE_PROVIDER_SITE_OTHER): Payer: Medicare Other | Admitting: Internal Medicine

## 2017-04-11 ENCOUNTER — Encounter: Payer: Self-pay | Admitting: Internal Medicine

## 2017-04-11 ENCOUNTER — Encounter: Payer: No Typology Code available for payment source | Admitting: Speech Pathology

## 2017-04-11 VITALS — BP 110/82 | HR 61 | Temp 98.3°F | Resp 12 | Ht 62.0 in | Wt 93.6 lb

## 2017-04-11 DIAGNOSIS — Z86010 Personal history of colonic polyps: Secondary | ICD-10-CM | POA: Diagnosis not present

## 2017-04-11 DIAGNOSIS — R634 Abnormal weight loss: Secondary | ICD-10-CM

## 2017-04-11 DIAGNOSIS — R7989 Other specified abnormal findings of blood chemistry: Secondary | ICD-10-CM

## 2017-04-11 DIAGNOSIS — E041 Nontoxic single thyroid nodule: Secondary | ICD-10-CM

## 2017-04-11 DIAGNOSIS — R413 Other amnesia: Secondary | ICD-10-CM

## 2017-04-11 LAB — BASIC METABOLIC PANEL
BUN: 19 mg/dL (ref 6–23)
CALCIUM: 9.9 mg/dL (ref 8.4–10.5)
CO2: 27 mEq/L (ref 19–32)
Chloride: 104 mEq/L (ref 96–112)
Creatinine, Ser: 1.08 mg/dL (ref 0.40–1.20)
GFR: 53.29 mL/min — AB (ref >60.00)
Glucose, Bld: 79 mg/dL (ref 70–99)
POTASSIUM: 4.6 meq/L (ref 3.5–5.1)
SODIUM: 139 meq/L (ref 135–145)

## 2017-04-11 NOTE — Progress Notes (Signed)
Patient ID: Carol Beard, female   DOB: Aug 18, 1946, 71 y.o.   MRN: [id number]   Subjective:    Patient ID: Carol Beard, female    DOB: 09-14-1946, 71 y.o.   MRN: [id number]  HPI  Patient here for a scheduled follow up.  She saw Dr Manuella Ghazi.  Diagnosed with primary progressive aphasia.  Recommended cognitive and speech therapy.  She has been going to therapy.  Still issues.  Increased stress with this diagnosis.  Discussed with her today.  Overall handling things relatively well.  Placed on aricept.  Recommended f/u in 6 months.  She reports no chest pain.  No sob.  No acid reflux. No abdominal pain.  Bowels moving.  Discussed the need to increase po intake.  Discussed nutritional shakes.     Past Medical History:  Diagnosis Date  . Environmental allergies   . Thyroid nodule    biopsy x 2 negative   Past Surgical History:  Procedure Laterality Date  . BREAST BIOPSY Left 1971   fibroadenoma, left breast  . TONSILLECTOMY     Family History  Problem Relation Age of Onset  . Cancer Mother     Colon  . Stroke Father   . Breast cancer Neg Hx    Social History   Social History  . Marital status: Married    Spouse name: N/A  . Number of children: 0  . Years of education: N/A   Social History Main Topics  . Smoking status: Never Smoker  . Smokeless tobacco: Never Used  . Alcohol use No     Comment: occasional wine with dinner  . Drug use: No  . Sexual activity: Not Asked   Other Topics Concern  . None   Social History Narrative  . None    Outpatient Encounter Prescriptions as of 04/11/2017  Medication Sig  . aspirin EC 81 MG tablet Take by mouth.  . donepezil (ARICEPT) 10 MG tablet Take 1 tablet by mouth daily.  . Calcium Carbonate (CALTRATE 600 PO) Take by mouth.  . Polyvinyl Alcohol-Povidone (REFRESH OP) Apply to eye.  Marland Kitchen SALINE NASAL SPRAY NA Place into the nose as needed.   No facility-administered encounter medications on file as of 04/11/2017.     Review  of Systems  Constitutional: Negative for appetite change.       Weight is up several pounds.  Discussed diet to increase weight.    HENT: Negative for congestion and sinus pressure.   Respiratory: Negative for cough, chest tightness and shortness of breath.   Cardiovascular: Negative for chest pain, palpitations and leg swelling.  Gastrointestinal: Negative for abdominal pain, diarrhea, nausea and vomiting.  Genitourinary: Negative for difficulty urinating and dysuria.  Musculoskeletal: Negative for back pain and joint swelling.  Skin: Negative for color change and rash.  Neurological:       Progressive aphasia.  Undergoing therapy.   Psychiatric/Behavioral: Negative for agitation and dysphoric mood.       Objective:    Physical Exam  Constitutional: She appears well-developed and well-nourished. No distress.  HENT:  Nose: Nose normal.  Mouth/Throat: Oropharynx is clear and moist.  Neck: Neck supple. No thyromegaly present.  Cardiovascular: Normal rate and regular rhythm.   Pulmonary/Chest: Breath sounds normal. No respiratory distress. She has no wheezes.  Abdominal: Soft. Bowel sounds are normal. There is no tenderness.  Musculoskeletal: She exhibits no edema or tenderness.  Lymphadenopathy:    She has no cervical adenopathy.  Skin: No rash noted. No  erythema.  Psychiatric: She has a normal mood and affect. Her behavior is normal.    BP 110/82 (BP Location: Left Arm, Patient Position: Sitting, Cuff Size: Normal)   Pulse 61   Temp 98.3 F (36.8 C) (Oral)   Resp 12   Ht 5\' 2"  (1.575 m)   Wt 93 lb 9.6 oz (42.5 kg)   SpO2 99%   BMI 17.12 kg/m  Wt Readings from Last 3 Encounters:  04/11/17 93 lb 9.6 oz (42.5 kg)  08/30/16 89 lb (40.4 kg)  12/22/15 87 lb 12 oz (39.8 kg)     Lab Results  Component Value Date   WBC 4.8 09/13/2016   HGB 13.0 09/13/2016   HCT 38.6 09/13/2016   PLT 207.0 09/13/2016   GLUCOSE 79 04/11/2017   CHOL 180 06/23/2013   TRIG 58.0 06/23/2013     HDL 78.30 06/23/2013   LDLCALC 90 06/23/2013   ALT 18 09/13/2016   AST 24 09/13/2016   NA 139 04/11/2017   K 4.6 04/11/2017   CL 104 04/11/2017   CREATININE 1.08 04/11/2017   BUN 19 04/11/2017   CO2 27 04/11/2017   TSH 1.24 09/13/2016    Mr Brain W Wo Contrast  Result Date: 09/11/2016 CLINICAL DATA:  Memory change.  Expressive aphasia. EXAM: MRI HEAD WITHOUT AND WITH CONTRAST TECHNIQUE: Multiplanar, multiecho pulse sequences of the brain and surrounding structures were obtained without and with intravenous contrast. CONTRAST:  33mL MULTIHANCE GADOBENATE DIMEGLUMINE 529 MG/ML IV SOLN COMPARISON:  CT head 11/03/2015 FINDINGS: Brain: Ventricle size normal. Cerebral volume normal for age with mild cortical atrophy over the convexity. Negative for acute infarct. Scattered white matter hyperintensities bilaterally consistent with chronic microvascular ischemia. Mild chronic ischemia in the pons. Negative for hemorrhage or fluid collection. Negative for mass or edema. No shift of the midline structures. Normal enhancement following contrast infusion. No enhancing mass lesion. Vascular: Hypoplastic right transverse sinus. Left transverse sinus widely patent and enhances normally. Normal arterial flow voids. Skull and upper cervical spine: Negative Sinuses/Orbits: Negative Other: None IMPRESSION: No acute abnormality. Mild to moderate chronic microvascular ischemic change. Electronically Signed   By: Franchot Gallo M.D.   On: 09/11/2016 17:00       Assessment & Plan:   Problem List Items Addressed This Visit    History of colonic polyps    Colonoscopy 10/22/13 - tubular adenomatous polyp.  Recommended f/u colnoscopy in 10/2018.        Memory change    Saw neurology.  MRI as outlined.  On aricept.        Thyroid nodule    s/p two biopsies.  Negative.  Saw ENT.        Weight loss    Weight increased a few pounds from last check.  Discussed the need to increased po intake.  Follow.          Other Visit Diagnoses    Elevated serum creatinine    -  Primary   Relevant Orders   Basic metabolic panel (Completed)       Einar Pheasant, MD

## 2017-04-11 NOTE — Progress Notes (Signed)
Pre-visit discussion using our clinic review tool. No additional management support is needed unless otherwise documented below in the visit note.  

## 2017-04-14 ENCOUNTER — Telehealth: Payer: Self-pay

## 2017-04-14 NOTE — Telephone Encounter (Signed)
-----   Message from Einar Pheasant, MD sent at 04/13/2017 10:00 PM EDT ----- Notify pt that her kidney function is overall stable.  Sodium wnl.

## 2017-04-14 NOTE — Telephone Encounter (Signed)
Left message to return call to our office.  

## 2017-04-14 NOTE — Telephone Encounter (Signed)
Patient informed will call if any questions.  

## 2017-04-20 ENCOUNTER — Encounter: Payer: Self-pay | Admitting: Internal Medicine

## 2017-04-20 NOTE — Assessment & Plan Note (Signed)
Colonoscopy 10/22/13 - tubular adenomatous polyp.  Recommended f/u colnoscopy in 10/2018.

## 2017-04-20 NOTE — Assessment & Plan Note (Signed)
Saw neurology.  MRI as outlined.  On aricept.

## 2017-04-20 NOTE — Assessment & Plan Note (Signed)
Weight increased a few pounds from last check.  Discussed the need to increased po intake.  Follow.

## 2017-04-20 NOTE — Assessment & Plan Note (Signed)
s/p two biopsies.  Negative.  Saw ENT.

## 2017-04-25 ENCOUNTER — Encounter: Payer: No Typology Code available for payment source | Admitting: Speech Pathology

## 2017-05-26 ENCOUNTER — Ambulatory Visit: Payer: No Typology Code available for payment source | Admitting: Internal Medicine

## 2017-06-30 DIAGNOSIS — F0280 Dementia in other diseases classified elsewhere without behavioral disturbance: Secondary | ICD-10-CM | POA: Diagnosis not present

## 2017-06-30 DIAGNOSIS — R634 Abnormal weight loss: Secondary | ICD-10-CM | POA: Diagnosis not present

## 2017-06-30 DIAGNOSIS — G3101 Pick's disease: Secondary | ICD-10-CM | POA: Diagnosis not present

## 2017-07-02 DIAGNOSIS — H2513 Age-related nuclear cataract, bilateral: Secondary | ICD-10-CM | POA: Diagnosis not present

## 2017-07-16 ENCOUNTER — Encounter: Payer: Self-pay | Admitting: Internal Medicine

## 2017-07-16 ENCOUNTER — Ambulatory Visit (INDEPENDENT_AMBULATORY_CARE_PROVIDER_SITE_OTHER): Payer: Medicare Other | Admitting: Internal Medicine

## 2017-07-16 VITALS — BP 108/78 | HR 78 | Temp 98.6°F | Resp 12 | Ht 62.0 in | Wt 92.6 lb

## 2017-07-16 DIAGNOSIS — Z1239 Encounter for other screening for malignant neoplasm of breast: Secondary | ICD-10-CM

## 2017-07-16 DIAGNOSIS — F0280 Dementia in other diseases classified elsewhere without behavioral disturbance: Secondary | ICD-10-CM | POA: Diagnosis not present

## 2017-07-16 DIAGNOSIS — Z1231 Encounter for screening mammogram for malignant neoplasm of breast: Secondary | ICD-10-CM

## 2017-07-16 DIAGNOSIS — R634 Abnormal weight loss: Secondary | ICD-10-CM

## 2017-07-16 DIAGNOSIS — R413 Other amnesia: Secondary | ICD-10-CM

## 2017-07-16 DIAGNOSIS — E041 Nontoxic single thyroid nodule: Secondary | ICD-10-CM

## 2017-07-16 DIAGNOSIS — Z0000 Encounter for general adult medical examination without abnormal findings: Secondary | ICD-10-CM

## 2017-07-16 DIAGNOSIS — G3101 Pick's disease: Secondary | ICD-10-CM | POA: Diagnosis not present

## 2017-07-16 DIAGNOSIS — Z13220 Encounter for screening for lipoid disorders: Secondary | ICD-10-CM | POA: Diagnosis not present

## 2017-07-16 LAB — CBC WITH DIFFERENTIAL/PLATELET
BASOS PCT: 1.1 % (ref 0.0–3.0)
Basophils Absolute: 0.1 10*3/uL (ref 0.0–0.1)
EOS ABS: 0 10*3/uL (ref 0.0–0.7)
Eosinophils Relative: 0.3 % (ref 0.0–5.0)
HCT: 43.5 % (ref 36.0–46.0)
Hemoglobin: 14.6 g/dL (ref 12.0–15.0)
LYMPHS ABS: 0.6 10*3/uL — AB (ref 0.7–4.0)
Lymphocytes Relative: 9.9 % — ABNORMAL LOW (ref 12.0–46.0)
MCHC: 33.5 g/dL (ref 30.0–36.0)
MCV: 97 fl (ref 78.0–100.0)
MONOS PCT: 5.5 % (ref 3.0–12.0)
Monocytes Absolute: 0.3 10*3/uL (ref 0.1–1.0)
NEUTROS ABS: 4.9 10*3/uL (ref 1.4–7.7)
NEUTROS PCT: 83.2 % — AB (ref 43.0–77.0)
PLATELETS: 206 10*3/uL (ref 150.0–400.0)
RBC: 4.48 Mil/uL (ref 3.87–5.11)
RDW: 13.2 % (ref 11.5–15.5)
WBC: 5.9 10*3/uL (ref 4.0–10.5)

## 2017-07-16 LAB — COMPREHENSIVE METABOLIC PANEL
ALT: 23 U/L (ref 0–35)
AST: 30 U/L (ref 0–37)
Albumin: 5 g/dL (ref 3.5–5.2)
Alkaline Phosphatase: 61 U/L (ref 39–117)
BILIRUBIN TOTAL: 0.6 mg/dL (ref 0.2–1.2)
BUN: 16 mg/dL (ref 6–23)
CHLORIDE: 100 meq/L (ref 96–112)
CO2: 30 meq/L (ref 19–32)
Calcium: 10.3 mg/dL (ref 8.4–10.5)
Creatinine, Ser: 1.07 mg/dL (ref 0.40–1.20)
GFR: 53.83 mL/min — ABNORMAL LOW (ref >60.00)
GLUCOSE: 88 mg/dL (ref 70–99)
Potassium: 3.7 mEq/L (ref 3.5–5.1)
Sodium: 138 mEq/L (ref 135–145)
Total Protein: 8.1 g/dL (ref 6.0–8.3)

## 2017-07-16 LAB — LIPID PANEL
CHOL/HDL RATIO: 2
CHOLESTEROL: 212 mg/dL — AB (ref 0–200)
HDL: 91.5 mg/dL (ref >39.00)
LDL CALC: 102 mg/dL — AB (ref 0–99)
NonHDL: 120.54
Triglycerides: 92 mg/dL (ref 0.0–149.0)
VLDL: 18.4 mg/dL (ref 0.0–40.0)

## 2017-07-16 LAB — TSH: TSH: 1.68 u[IU]/mL (ref 0.35–4.50)

## 2017-07-16 NOTE — Progress Notes (Signed)
Patient ID: AMRI LIEN, female   DOB: 1946-01-10, 71 y.o.   MRN: [id number]   Subjective:    Patient ID: [name], female    DOB: 1946-05-31, 71 y.o.   MRN: [id number]  HPI  Patient with past history of allergies and thyroid nodule.  Recently diagnosed with primary progressive aphasia.  She comes in today to f/u on these issues as well as for a complete physical exam.  She reports she is doing some better.  Still trying to cope with her aphasia.  Increased stress related to this, but is coping better.  Living with a friend Mindi Curling) - who helps her.  Tries to stay active.  No chest pain.  No sob.  No acid reflux.  No abdominal pain.  Bowels moving.  Just saw neurology 06/30/17.  On aricept.    Past Medical History:  Diagnosis Date  . Environmental allergies   . Thyroid nodule    biopsy x 2 negative   Past Surgical History:  Procedure Laterality Date  . BREAST BIOPSY Left 1971   fibroadenoma, left breast  . TONSILLECTOMY     Family History  Problem Relation Age of Onset  . Cancer Mother        Colon  . Stroke Father   . Breast cancer Neg Hx    Social History   Social History  . Marital status: Married    Spouse name: N/A  . Number of children: 0  . Years of education: N/A   Social History Main Topics  . Smoking status: Never Smoker  . Smokeless tobacco: Never Used  . Alcohol use No     Comment: occasional wine with dinner  . Drug use: No  . Sexual activity: Not Asked   Other Topics Concern  . None   Social History Narrative  . None    Outpatient Encounter Prescriptions as of 07/16/2017  Medication Sig  . aspirin EC 81 MG tablet Take by mouth.  . Calcium Carbonate (CALTRATE 600 PO) Take by mouth.  . donepezil (ARICEPT) 10 MG tablet Take 1 tablet by mouth daily.  Marland Kitchen SALINE NASAL SPRAY NA Place into the nose as needed.  . Polyvinyl Alcohol-Povidone (REFRESH OP) Apply to eye.   No facility-administered encounter medications on file as of  07/16/2017.     Review of Systems  Constitutional: Negative for appetite change and unexpected weight change.  HENT: Negative for congestion and sinus pressure.   Eyes: Negative for pain and visual disturbance.  Respiratory: Negative for cough, chest tightness and shortness of breath.   Cardiovascular: Negative for chest pain, palpitations and leg swelling.  Gastrointestinal: Negative for abdominal pain, diarrhea, nausea and vomiting.  Genitourinary: Negative for difficulty urinating and dysuria.  Musculoskeletal: Negative for back pain and joint swelling.  Skin: Negative for color change and rash.  Neurological: Negative for dizziness, light-headedness and headaches.  Hematological: Negative for adenopathy. Does not bruise/bleed easily.  Psychiatric/Behavioral: Negative for agitation and dysphoric mood.       Objective:    Physical Exam  Constitutional: She is oriented to person, place, and time. She appears well-developed and well-nourished. No distress.  HENT:  Nose: Nose normal.  Mouth/Throat: Oropharynx is clear and moist.  Eyes: Right eye exhibits no discharge. Left eye exhibits no discharge. No scleral icterus.  Neck: Neck supple. No thyromegaly present.  Cardiovascular: Normal rate and regular rhythm.   Pulmonary/Chest: Breath sounds normal. No accessory muscle usage. No tachypnea. No respiratory distress. She has  no decreased breath sounds. She has no wheezes. She has no rhonchi. Right breast exhibits no inverted nipple, no mass, no nipple discharge and no tenderness (no axillary adenopathy). Left breast exhibits no inverted nipple, no mass, no nipple discharge and no tenderness (no axilarry adenopathy).  Abdominal: Soft. Bowel sounds are normal. There is no tenderness.  Musculoskeletal: She exhibits no edema or tenderness.  Lymphadenopathy:    She has no cervical adenopathy.  Neurological: She is alert and oriented to person, place, and time.  Skin: Skin is warm. No rash  noted. No erythema.  Psychiatric: She has a normal mood and affect. Her behavior is normal.    BP 108/78 (BP Location: Left Arm, Patient Position: Sitting, Cuff Size: Normal)   Pulse 78   Temp 98.6 F (37 C) (Oral)   Resp 12   Ht 5\' 2"  (1.575 m)   Wt 92 lb 9.6 oz (42 kg)   SpO2 98%   BMI 16.94 kg/m  Wt Readings from Last 3 Encounters:  07/16/17 92 lb 9.6 oz (42 kg)  04/11/17 93 lb 9.6 oz (42.5 kg)  08/30/16 89 lb (40.4 kg)     Lab Results  Component Value Date   WBC 5.9 07/16/2017   HGB 14.6 07/16/2017   HCT 43.5 07/16/2017   PLT 206.0 07/16/2017   GLUCOSE 88 07/16/2017   CHOL 212 (H) 07/16/2017   TRIG 92.0 07/16/2017   HDL 91.50 07/16/2017   LDLCALC 102 (H) 07/16/2017   ALT 23 07/16/2017   AST 30 07/16/2017   NA 138 07/16/2017   K 3.7 07/16/2017   CL 100 07/16/2017   CREATININE 1.07 07/16/2017   BUN 16 07/16/2017   CO2 30 07/16/2017   TSH 1.68 07/16/2017    Mr Brain W Wo Contrast  Result Date: 09/11/2016 CLINICAL DATA:  Memory change.  Expressive aphasia. EXAM: MRI HEAD WITHOUT AND WITH CONTRAST TECHNIQUE: Multiplanar, multiecho pulse sequences of the brain and surrounding structures were obtained without and with intravenous contrast. CONTRAST:  71mL MULTIHANCE GADOBENATE DIMEGLUMINE 529 MG/ML IV SOLN COMPARISON:  CT head 11/03/2015 FINDINGS: Brain: Ventricle size normal. Cerebral volume normal for age with mild cortical atrophy over the convexity. Negative for acute infarct. Scattered white matter hyperintensities bilaterally consistent with chronic microvascular ischemia. Mild chronic ischemia in the pons. Negative for hemorrhage or fluid collection. Negative for mass or edema. No shift of the midline structures. Normal enhancement following contrast infusion. No enhancing mass lesion. Vascular: Hypoplastic right transverse sinus. Left transverse sinus widely patent and enhances normally. Normal arterial flow voids. Skull and upper cervical spine: Negative  Sinuses/Orbits: Negative Other: None IMPRESSION: No acute abnormality. Mild to moderate chronic microvascular ischemic change. Electronically Signed   By: Franchot Gallo M.D.   On: 09/11/2016 17:00       Assessment & Plan:   Problem List Items Addressed This Visit    Health care maintenance    Physical today 07/16/17.  Colonoscopy 10/2013.  Recommended f/u in 2019.  Schedule mammogram.        Memory change    Seeing neurology.  Had MRI.  On aricept.        Primary progressive aphasia    Recently diagnosed.  Following with neurology.        Thyroid nodule - Primary    S/p two biopsies.  Negative.  Saw ENT.  Question if f/u warranted.  Will need to check with ENT.        Relevant Orders   TSH (Completed)  Weight loss    Weight relatively stable.  Just slight decrease.  Discussed diet and increased po intake and nutritional supplements.  Follow.        Relevant Orders   CBC with Differential/Platelet (Completed)   Comprehensive metabolic panel (Completed)    Other Visit Diagnoses    Screening cholesterol level       Relevant Orders   Lipid panel (Completed)   Breast cancer screening       Relevant Orders   MM DIGITAL SCREENING BILATERAL       Einar Pheasant, MD

## 2017-07-16 NOTE — Progress Notes (Signed)
Pre-visit discussion using our clinic review tool. No additional management support is needed unless otherwise documented below in the visit note.  

## 2017-07-16 NOTE — Assessment & Plan Note (Addendum)
Physical today 07/16/17.  Colonoscopy 10/2013.  Recommended f/u in 2019.  Schedule mammogram.

## 2017-07-17 ENCOUNTER — Other Ambulatory Visit: Payer: Self-pay | Admitting: Internal Medicine

## 2017-07-17 DIAGNOSIS — D72810 Lymphocytopenia: Secondary | ICD-10-CM

## 2017-07-17 NOTE — Progress Notes (Signed)
Order placed for f/u [facility].   

## 2017-07-19 ENCOUNTER — Encounter: Payer: Self-pay | Admitting: Internal Medicine

## 2017-07-19 DIAGNOSIS — F0280 Dementia in other diseases classified elsewhere without behavioral disturbance: Secondary | ICD-10-CM | POA: Insufficient documentation

## 2017-07-19 DIAGNOSIS — G3101 Pick's disease: Secondary | ICD-10-CM

## 2017-07-19 NOTE — Assessment & Plan Note (Signed)
Recently diagnosed.  Following with neurology.

## 2017-07-19 NOTE — Assessment & Plan Note (Signed)
Weight relatively stable.  Just slight decrease.  Discussed diet and increased po intake and nutritional supplements.  Follow.

## 2017-07-19 NOTE — Assessment & Plan Note (Signed)
Seeing neurology.  Had MRI.  On aricept.

## 2017-07-19 NOTE — Assessment & Plan Note (Signed)
S/p two biopsies.  Negative.  Saw ENT.  Question if f/u warranted.  Will need to check with ENT.

## 2017-07-21 ENCOUNTER — Telehealth: Payer: Self-pay

## 2017-07-21 NOTE — Telephone Encounter (Signed)
Called [facility] will fax notes.

## 2017-07-21 NOTE — Telephone Encounter (Signed)
Please call pt [phone]

## 2017-07-21 NOTE — Telephone Encounter (Signed)
-----   Message from Einar Pheasant, MD sent at 07/19/2017  9:12 AM EDT ----- Regarding: ent f/u This pt has seen ent previously for thyroid nodule.  She has had two biopsies.  Need latest records and need to know if she needs f/u with them regarding her thyroid nodule.  Thanks    Dr Nicki Reaper

## 2017-07-21 NOTE — Telephone Encounter (Signed)
Called patient need to find out where she has been seen by ENT so that we can get noes.

## 2017-07-30 ENCOUNTER — Other Ambulatory Visit (INDEPENDENT_AMBULATORY_CARE_PROVIDER_SITE_OTHER): Payer: Medicare Other

## 2017-07-30 DIAGNOSIS — D72810 Lymphocytopenia: Secondary | ICD-10-CM | POA: Diagnosis not present

## 2017-07-30 LAB — CBC WITH DIFFERENTIAL/PLATELET
BASOS ABS: 0.1 10*3/uL (ref 0.0–0.1)
Basophils Relative: 1 % (ref 0.0–3.0)
EOS ABS: 0 10*3/uL (ref 0.0–0.7)
Eosinophils Relative: 0.6 % (ref 0.0–5.0)
HEMATOCRIT: 43.3 % (ref 36.0–46.0)
Hemoglobin: 14.2 g/dL (ref 12.0–15.0)
LYMPHS PCT: 17.3 % (ref 12.0–46.0)
Lymphs Abs: 1 10*3/uL (ref 0.7–4.0)
MCHC: 32.8 g/dL (ref 30.0–36.0)
MCV: 97.8 fl (ref 78.0–100.0)
MONOS PCT: 6.2 % (ref 3.0–12.0)
Monocytes Absolute: 0.4 10*3/uL (ref 0.1–1.0)
NEUTROS PCT: 74.9 % (ref 43.0–77.0)
Neutro Abs: 4.3 10*3/uL (ref 1.4–7.7)
PLATELETS: 199 10*3/uL (ref 150.0–400.0)
RBC: 4.43 Mil/uL (ref 3.87–5.11)
RDW: 13.1 % (ref 11.5–15.5)
WBC: 5.7 10*3/uL (ref 4.0–10.5)

## 2017-08-01 ENCOUNTER — Ambulatory Visit
Admission: RE | Admit: 2017-08-01 | Discharge: 2017-08-01 | Disposition: A | Discharge status: Home / Self Care | Payer: Medicare Other | Source: Ambulatory Visit | Attending: Internal Medicine | Admitting: Internal Medicine

## 2017-08-01 DIAGNOSIS — Z1231 Encounter for screening mammogram for malignant neoplasm of breast: Secondary | ICD-10-CM | POA: Insufficient documentation

## 2017-08-01 DIAGNOSIS — Z1239 Encounter for other screening for malignant neoplasm of breast: Secondary | ICD-10-CM

## 2017-09-19 ENCOUNTER — Ambulatory Visit: Payer: No Typology Code available for payment source

## 2017-09-19 DIAGNOSIS — J301 Allergic rhinitis due to pollen: Secondary | ICD-10-CM | POA: Diagnosis not present

## 2017-09-19 DIAGNOSIS — E041 Nontoxic single thyroid nodule: Secondary | ICD-10-CM | POA: Diagnosis not present

## 2017-12-31 DIAGNOSIS — F0280 Dementia in other diseases classified elsewhere without behavioral disturbance: Secondary | ICD-10-CM | POA: Diagnosis not present

## 2017-12-31 DIAGNOSIS — R634 Abnormal weight loss: Secondary | ICD-10-CM | POA: Diagnosis not present

## 2017-12-31 DIAGNOSIS — G3101 Pick's disease: Secondary | ICD-10-CM | POA: Diagnosis not present

## 2018-02-17 ENCOUNTER — Ambulatory Visit: Payer: No Typology Code available for payment source | Admitting: Internal Medicine

## 2018-03-06 ENCOUNTER — Encounter: Payer: Self-pay | Admitting: Internal Medicine

## 2018-03-06 ENCOUNTER — Ambulatory Visit (INDEPENDENT_AMBULATORY_CARE_PROVIDER_SITE_OTHER): Payer: Medicare Other | Admitting: Internal Medicine

## 2018-03-06 DIAGNOSIS — G3101 Pick's disease: Secondary | ICD-10-CM

## 2018-03-06 DIAGNOSIS — R634 Abnormal weight loss: Secondary | ICD-10-CM | POA: Diagnosis not present

## 2018-03-06 DIAGNOSIS — F0280 Dementia in other diseases classified elsewhere without behavioral disturbance: Secondary | ICD-10-CM | POA: Diagnosis not present

## 2018-03-06 DIAGNOSIS — E041 Nontoxic single thyroid nodule: Secondary | ICD-10-CM | POA: Diagnosis not present

## 2018-03-06 DIAGNOSIS — R413 Other amnesia: Secondary | ICD-10-CM | POA: Diagnosis not present

## 2018-03-06 NOTE — Progress Notes (Signed)
Patient ID: Carol Beard, female   DOB: December 24, 1945, 72 y.o.   MRN: [id number]   Subjective:    Patient ID: Carol Beard, female    DOB: [birth year], 72 y.o.   MRN: [id number]  HPI  Patient here for a scheduled follow up.  She was previously diagnosed with primary progressive aphasia.  Followed by neurology.  Was also started on aricept.  States she has not been taking.  Discussed with her today.  She plans to start.  Overall she is feeling better.  Some increased stress related to her medication issues and her roommates medical issues.  Overall she feels she is handling things relatively well.  Does not feel needs any thing more at this time.  No chest pain.  Breathing stable.  No acid reflux.  No abdominal pain.  Bowels moving.  Overdue f/u with ENT for thyroid.     Past Medical History:  Diagnosis Date  . Environmental allergies   . Thyroid nodule    biopsy x 2 negative   Past Surgical History:  Procedure Laterality Date  . BREAST BIOPSY Left 1971   fibroadenoma, left breast  . TONSILLECTOMY     Family History  Problem Relation Age of Onset  . Cancer Mother        Colon  . Stroke Father   . Breast cancer Neg Hx    Social History   Socioeconomic History  . Marital status: Married    Spouse name: Not on file  . Number of children: 0  . Years of education: Not on file  . Highest education level: Not on file  Occupational History  . Not on file  Social Needs  . Financial resource strain: Not on file  . Food insecurity:    Worry: Not on file    Inability: Not on file  . Transportation needs:    Medical: Not on file    Non-medical: Not on file  Tobacco Use  . Smoking status: Never Smoker  . Smokeless tobacco: Never Used  Substance and Sexual Activity  . Alcohol use: No    Alcohol/week: 0.0 oz    Comment: occasional wine with dinner  . Drug use: No  . Sexual activity: Not on file  Lifestyle  . Physical activity:    Days per week: Not on file    Minutes per  session: Not on file  . Stress: Not on file  Relationships  . Social connections:    Talks on phone: Not on file    Gets together: Not on file    Attends religious service: Not on file    Active member of club or organization: Not on file    Attends meetings of clubs or organizations: Not on file    Relationship status: Not on file  Other Topics Concern  . Not on file  Social History Narrative  . Not on file    Outpatient Encounter Medications as of 03/06/2018  Medication Sig  . aspirin EC 81 MG tablet Take by mouth.  . Calcium Carbonate (CALTRATE 600 PO) Take by mouth.  . donepezil (ARICEPT) 10 MG tablet Take 1 tablet by mouth daily.  . Polyvinyl Alcohol-Povidone (REFRESH OP) Apply to eye.  Marland Kitchen SALINE NASAL SPRAY NA Place into the nose as needed.   No facility-administered encounter medications on file as of 03/06/2018.     Review of Systems  Constitutional: Negative for appetite change.       Weight is up.  Eating better.  HENT: Negative for congestion and sinus pressure.   Respiratory: Negative for cough, chest tightness and shortness of breath.   Cardiovascular: Negative for chest pain, palpitations and leg swelling.  Gastrointestinal: Negative for abdominal pain, diarrhea, nausea and vomiting.  Genitourinary: Negative for difficulty urinating and dysuria.  Musculoskeletal: Negative for joint swelling and myalgias.  Skin: Negative for color change and rash.  Neurological: Negative for dizziness, light-headedness and headaches.  Psychiatric/Behavioral: Negative for agitation and dysphoric mood.       Objective:    Physical Exam  Constitutional: She appears well-developed and well-nourished. No distress.  HENT:  Nose: Nose normal.  Mouth/Throat: Oropharynx is clear and moist.  Neck: Neck supple.  Cardiovascular: Normal rate and regular rhythm.  Pulmonary/Chest: Breath sounds normal. No respiratory distress. She has no wheezes.  Abdominal: Soft. Bowel sounds are  normal. There is no tenderness.  Musculoskeletal: She exhibits no edema or tenderness.  Lymphadenopathy:    She has no cervical adenopathy.  Skin: No rash noted. No erythema.  Psychiatric: She has a normal mood and affect. Her behavior is normal.    BP 136/84 (BP Location: Left Arm)   Pulse 78   Temp 98.3 F (36.8 C) (Oral)   Resp 18   Ht 5\' 2"  (1.575 m)   Wt 98 lb 9.6 oz (44.7 kg)   SpO2 97%   BMI 18.03 kg/m  Wt Readings from Last 3 Encounters:  03/06/18 98 lb 9.6 oz (44.7 kg)  07/16/17 92 lb 9.6 oz (42 kg)  04/11/17 93 lb 9.6 oz (42.5 kg)     Lab Results  Component Value Date   WBC 5.7 07/30/2017   HGB 14.2 07/30/2017   HCT 43.3 07/30/2017   PLT 199.0 07/30/2017   GLUCOSE 88 07/16/2017   CHOL 212 (H) 07/16/2017   TRIG 92.0 07/16/2017   HDL 91.50 07/16/2017   LDLCALC 102 (H) 07/16/2017   ALT 23 07/16/2017   AST 30 07/16/2017   NA 138 07/16/2017   K 3.7 07/16/2017   CL 100 07/16/2017   CREATININE 1.07 07/16/2017   BUN 16 07/16/2017   CO2 30 07/16/2017   TSH 1.68 07/16/2017    Mm Digital Screening Bilateral  Result Date: 08/01/2017 CLINICAL DATA:  Screening. EXAM: DIGITAL SCREENING BILATERAL MAMMOGRAM WITH CAD COMPARISON:  Previous exam(s). ACR Breast Density Category b: There are scattered areas of fibroglandular density. FINDINGS: There are no findings suspicious for malignancy. Images were processed with CAD. IMPRESSION: No mammographic evidence of malignancy. A result letter of this screening mammogram will be mailed directly to the patient. RECOMMENDATION: Screening mammogram in one year. (Code:SM-B-01Y) BI-RADS CATEGORY  1: Negative. Electronically Signed   By: Lajean Manes M.D.   On: 08/01/2017 12:48       Assessment & Plan:   Problem List Items Addressed This Visit    Memory change    Saw neurology.  S/p mri.  They recommended starting aricept.  Not taking.  Will start.       Primary progressive aphasia    Discussed with her today.  Seeing  neurology.  Overall handling this relatively well.  Follow.       Thyroid nodule    S/p two biopsies.  Negative.  Saw ENT.  Recommended f/u.  Overdue.  Schedule.        Relevant Orders   Ambulatory referral to ENT   Weight loss    Weight up from previous check.  Eating.  Follow.  Einar Pheasant, MD

## 2018-03-09 ENCOUNTER — Encounter: Payer: Self-pay | Admitting: Internal Medicine

## 2018-03-09 NOTE — Assessment & Plan Note (Signed)
Discussed with her today.  Seeing neurology.  Overall handling this relatively well.  Follow.

## 2018-03-09 NOTE — Assessment & Plan Note (Signed)
S/p two biopsies.  Negative.  Saw ENT.  Recommended f/u.  Overdue.  Schedule.

## 2018-03-09 NOTE — Assessment & Plan Note (Signed)
Saw neurology.  S/p mri.  They recommended starting aricept.  Not taking.  Will start.

## 2018-03-09 NOTE — Assessment & Plan Note (Signed)
Weight up from previous check.  Eating.  Follow.

## 2018-04-10 ENCOUNTER — Telehealth: Payer: Self-pay | Admitting: Internal Medicine

## 2018-04-10 NOTE — Telephone Encounter (Unsigned)
Copied from [place] [id number]. Topic: Quick Communication - See Telephone Encounter >> Apr 10, 2018  4:43 PM Percell Belt A wrote: CRM for notification. See Telephone encounter for: 04/10/18. [facility] needs note refaxed back over before pt visit next week . The referral and office notes   Fax number [phone]

## 2018-04-13 NOTE — Telephone Encounter (Signed)
Faxed to [facility]

## 2018-04-17 DIAGNOSIS — E041 Nontoxic single thyroid nodule: Secondary | ICD-10-CM | POA: Diagnosis not present

## 2018-04-17 DIAGNOSIS — H6121 Impacted cerumen, right ear: Secondary | ICD-10-CM | POA: Diagnosis not present

## 2018-06-08 ENCOUNTER — Ambulatory Visit (INDEPENDENT_AMBULATORY_CARE_PROVIDER_SITE_OTHER): Payer: Medicare Other

## 2018-06-08 VITALS — BP 118/62 | HR 63 | Temp 98.2°F | Resp 14 | Ht 62.75 in | Wt 99.0 lb

## 2018-06-08 DIAGNOSIS — Z0000 Encounter for general adult medical examination without abnormal findings: Secondary | ICD-10-CM

## 2018-06-08 NOTE — Patient Instructions (Addendum)
  Ms. Decuir , Thank you for taking time to come for your Medicare Wellness Visit. I appreciate your ongoing commitment to your health goals. Please review the following plan we discussed and let me know if I can assist you in the future.   These are the goals we discussed: Goals    . DIET - INCREASE LEAN PROTEINS     Add Ensure daily with meal routine.     Marland Kitchen DIET - INCREASE WATER INTAKE    . Increase physical activity     Stay active.  Walk for exercise with a friend.        This is a list of the screening recommended for you and due dates:  Health Maintenance  Topic Date Due  .  Hepatitis C: One time screening is recommended by Center for Disease Control  (CDC) for  adults born from [birth year] through 1964.   July 22, 1946  . Tetanus Vaccine  03/03/1966  . Flu Shot  07/16/2018  . Mammogram  08/02/2019  . Colon Cancer Screening  10/22/2023  . DEXA scan (bone density measurement)  Completed  . Pneumonia vaccines  Discontinued

## 2018-06-08 NOTE — Progress Notes (Addendum)
Subjective:   MACKENZY GRUMBINE is a 72 y.o. female who presents for an Initial Medicare Annual Wellness Visit.  Review of Systems    No ROS.  Medicare Wellness Visit. Additional risk factors are reflected in the social history.  Cardiac Risk Factors include: advanced age (>85men, >101 women)     Objective:    Today's Vitals   06/08/18 0947  BP: 118/62  Pulse: 63  Resp: 14  Temp: 98.2 F (36.8 C)  TempSrc: Oral  SpO2: 98%  Weight: 99 lb (44.9 kg)  Height: 5' 2.75" (1.594 m)   Body mass index is 17.68 kg/m.  Advanced Directives 06/08/2018 11/29/2016 11/03/2015  Does Patient Have a Medical Advance Directive? No No No  Would patient like information on creating a medical advance directive? No - Patient declined - No - patient declined information    Current Medications (verified) Outpatient Encounter Medications as of 06/08/2018  Medication Sig  . aspirin EC 81 MG tablet Take by mouth.  . Calcium Carbonate (CALTRATE 600 PO) Take by mouth.  . donepezil (ARICEPT) 10 MG tablet Take 1 tablet by mouth daily.  . Polyvinyl Alcohol-Povidone (REFRESH OP) Apply to eye.  Marland Kitchen SALINE NASAL SPRAY NA Place into the nose as needed.   No facility-administered encounter medications on file as of 06/08/2018.     Allergies (verified) Penicillins   History: Past Medical History:  Diagnosis Date  . Environmental allergies   . Thyroid nodule    biopsy x 2 negative   Past Surgical History:  Procedure Laterality Date  . BREAST BIOPSY Left 1971   fibroadenoma, left breast  . TONSILLECTOMY     Family History  Problem Relation Age of Onset  . Cancer Mother        Colon  . Stroke Father   . Breast cancer Neg Hx    Social History   Socioeconomic History  . Marital status: Married    Spouse name: Not on file  . Number of children: 0  . Years of education: Not on file  . Highest education level: Not on file  Occupational History  . Not on file  Social Needs  . Financial  resource strain: Not hard at all  . Food insecurity:    Worry: Never true    Inability: Never true  . Transportation needs:    Medical: No    Non-medical: No  Tobacco Use  . Smoking status: Never Smoker  . Smokeless tobacco: Never Used  Substance and Sexual Activity  . Alcohol use: No    Alcohol/week: 0.0 oz    Comment: occasional wine with dinner  . Drug use: No  . Sexual activity: Never  Lifestyle  . Physical activity:    Days per week: Not on file    Minutes per session: Not on file  . Stress: Not at all  Relationships  . Social connections:    Talks on phone: Not on file    Gets together: Not on file    Attends religious service: Not on file    Active member of club or organization: Not on file    Attends meetings of clubs or organizations: Not on file    Relationship status: Not on file  Other Topics Concern  . Not on file  Social History Narrative  . Not on file    Tobacco Counseling Counseling given: Not Answered   Clinical Intake:  Pre-visit preparation completed: Yes  Pain : No/denies pain     Nutritional  Status: BMI <19  Underweight Diabetes: No  How often do you need to have someone help you when you read instructions, pamphlets, or other written materials from your doctor or pharmacy?: 3 - Sometimes  Interpreter Needed?: No      Activities of Daily Living In your present state of health, do you have any difficulty performing the following activities: 06/08/2018  Hearing? N  Vision? N  Difficulty concentrating or making decisions? Y  Comment Progressive aphasia. Taking aricept. Followed by pcp.   Walking or climbing stairs? N  Dressing or bathing? N  Doing errands, shopping? Y  Comment She does not Physiological scientist and eating ? Y  Comment She does not cook.  Meals prepared by friend [name].  Self feeds.   Using the Toilet? N  In the past six months, have you accidently leaked urine? N  Do you have problems with loss of bowel control?  N  Managing your Medications? N  Managing your Finances? Y  Comment Seward Speck assists as needed.   Housekeeping or managing your Housekeeping? Y  Comment Shared with friend [name].   Some recent data might be hidden     Immunizations and Health Maintenance  There is no immunization history on file for this patient. Health Maintenance Due  Topic Date Due  . Hepatitis C Screening  [birth year]  . TETANUS/TDAP  03/03/1966    Patient Care Team: Einar Pheasant, MD as PCP - General (Internal Medicine)  Indicate any recent Medical Services you may have received from other than [facility] providers in the past year (date may be approximate).     Assessment:   This is a routine wellness examination for Lynia.  The goal of the wellness visit is to assist the patient how to close the gaps in care and create a preventative care plan for the patient.   The roster of all physicians providing medical care to patient is listed in the Snapshot section of the chart.  Osteoporosis risk reviewed.    Safety issues reviewed; Alarm with smoke and carbon monoxide detectors in the home. No firearms in the home. Wears seatbelts when riding with others. No violence in the home.  They do not have excessive sun exposure.  Discussed the need for sun protection: hats, long sleeves and the use of sunscreen if there is significant sun exposure.  BMI- discussed the importance of a healthy diet, water intake and the benefits of aerobic exercise. She tries to eat a healthy diet.  Weight is up 1 pound.    24 hour diet recall: Regular diet.  Ensure Enlive encouraged as a supplement to add more protein to diet.  Samples and couples provided.   Dental- UTD.  Eye- Visual acuity not assessed per patient preference since they have regular follow up with the ophthalmologist.  Wears corrective lenses.  Sleep patterns- Sleeps without issues.   Hep C screening and TDAP vaccine discussed.  Deferred for follow up with  pcp.   Patient Concerns: None at this time. Follow up with PCP as needed.  Hearing/Vision screen Hearing Screening Comments: Patient is able to hear conversational tones without difficulty.  No issues reported.  Vision Screening Comments: Wears corrective lenses Last OV 2017; encouraged to schedule an eye exam Visual acuity not assessed per patient preference   Dietary issues and exercise activities discussed: Current Exercise Habits: The patient does not participate in regular exercise at present  Goals    . DIET - INCREASE LEAN PROTEINS  Add Ensure daily with meal routine.     Marland Kitchen DIET - INCREASE WATER INTAKE    . Increase physical activity     Stay active.  Walk for exercise with a friend.       Depression Screen PHQ 2/9 Scores 06/08/2018 08/30/2016 07/29/2014 06/18/2013 04/18/2013  PHQ - 2 Score 0 0 0 0 0    Fall Risk Fall Risk  06/08/2018 08/30/2016 07/29/2014 06/18/2013 04/18/2013  Falls in the past year? No No No No No   Cognitive Function: MMSE - Mini Mental State Exam 06/08/2018  Not completed: Unable to complete       Screening Tests Health Maintenance  Topic Date Due  . Hepatitis C Screening  06-Aug-1946  . TETANUS/TDAP  03/03/1966  . INFLUENZA VACCINE  07/16/2018  . MAMMOGRAM  08/02/2019  . COLONOSCOPY  10/22/2023  . DEXA SCAN  Completed  . PNA vac Low Risk Adult  Discontinued     Plan:   End of life planning; Advanced aging; Advanced directives discussed.  No HCPOA/Living Will.  Additional information declined at this time.  I have personally reviewed and noted the following in the patient's chart:   . Medical and social history . Use of alcohol, tobacco or illicit drugs  . Current medications and supplements . Functional ability and status . Nutritional status . Physical activity . Advanced directives . List of other physicians . Hospitalizations, surgeries, and ER visits in previous 12 months . Vitals . Screenings to include cognitive, depression,  and falls . Referrals and appointments  In addition, I have reviewed and discussed with patient certain preventive protocols, quality metrics, and best practice recommendations. A written personalized care plan for preventive services as well as general preventive health recommendations were provided to patient.     Varney Biles, LPN   0/15/6153    Reviewed above information.  Agree with assessment and plan.    Dr Nicki Reaper

## 2018-08-31 DIAGNOSIS — F0280 Dementia in other diseases classified elsewhere without behavioral disturbance: Secondary | ICD-10-CM | POA: Diagnosis not present

## 2018-08-31 DIAGNOSIS — G3101 Pick's disease: Secondary | ICD-10-CM | POA: Diagnosis not present

## 2018-09-07 ENCOUNTER — Encounter: Payer: No Typology Code available for payment source | Admitting: Internal Medicine

## 2018-09-11 ENCOUNTER — Ambulatory Visit (INDEPENDENT_AMBULATORY_CARE_PROVIDER_SITE_OTHER): Payer: Medicare Other | Admitting: Internal Medicine

## 2018-09-11 ENCOUNTER — Encounter: Payer: Self-pay | Admitting: Internal Medicine

## 2018-09-11 VITALS — BP 100/74 | HR 74 | Temp 98.5°F | Resp 15 | Ht 62.75 in | Wt 100.0 lb

## 2018-09-11 DIAGNOSIS — Z1231 Encounter for screening mammogram for malignant neoplasm of breast: Secondary | ICD-10-CM | POA: Diagnosis not present

## 2018-09-11 DIAGNOSIS — F0280 Dementia in other diseases classified elsewhere without behavioral disturbance: Secondary | ICD-10-CM

## 2018-09-11 DIAGNOSIS — R413 Other amnesia: Secondary | ICD-10-CM | POA: Diagnosis not present

## 2018-09-11 DIAGNOSIS — G3101 Pick's disease: Secondary | ICD-10-CM | POA: Diagnosis not present

## 2018-09-11 DIAGNOSIS — M810 Age-related osteoporosis without current pathological fracture: Secondary | ICD-10-CM | POA: Diagnosis not present

## 2018-09-11 DIAGNOSIS — R634 Abnormal weight loss: Secondary | ICD-10-CM | POA: Diagnosis not present

## 2018-09-11 DIAGNOSIS — Z13220 Encounter for screening for lipoid disorders: Secondary | ICD-10-CM

## 2018-09-11 DIAGNOSIS — E041 Nontoxic single thyroid nodule: Secondary | ICD-10-CM

## 2018-09-11 DIAGNOSIS — Z1239 Encounter for other screening for malignant neoplasm of breast: Secondary | ICD-10-CM

## 2018-09-11 DIAGNOSIS — Z0000 Encounter for general adult medical examination without abnormal findings: Secondary | ICD-10-CM

## 2018-09-11 LAB — CBC WITH DIFFERENTIAL/PLATELET
BASOS ABS: 0.1 10*3/uL (ref 0.0–0.1)
Basophils Relative: 1.9 % (ref 0.0–3.0)
EOS ABS: 0 10*3/uL (ref 0.0–0.7)
Eosinophils Relative: 0.9 % (ref 0.0–5.0)
HEMATOCRIT: 42 % (ref 36.0–46.0)
Hemoglobin: 14.4 g/dL (ref 12.0–15.0)
LYMPHS PCT: 15.8 % (ref 12.0–46.0)
Lymphs Abs: 0.8 10*3/uL (ref 0.7–4.0)
MCHC: 34.3 g/dL (ref 30.0–36.0)
MCV: 92.5 fl (ref 78.0–100.0)
MONOS PCT: 6.7 % (ref 3.0–12.0)
Monocytes Absolute: 0.3 10*3/uL (ref 0.1–1.0)
NEUTROS ABS: 3.6 10*3/uL (ref 1.4–7.7)
NEUTROS PCT: 74.7 % (ref 43.0–77.0)
PLATELETS: 198 10*3/uL (ref 150.0–400.0)
RBC: 4.55 Mil/uL (ref 3.87–5.11)
RDW: 12.9 % (ref 11.5–15.5)
WBC: 4.8 10*3/uL (ref 4.0–10.5)

## 2018-09-11 LAB — LIPID PANEL
CHOL/HDL RATIO: 3
Cholesterol: 204 mg/dL — ABNORMAL HIGH (ref 0–200)
HDL: 78.5 mg/dL (ref >39.00)
LDL CALC: 102 mg/dL — AB (ref 0–99)
NonHDL: 125.65
Triglycerides: 116 mg/dL (ref 0.0–149.0)
VLDL: 23.2 mg/dL (ref 0.0–40.0)

## 2018-09-11 LAB — COMPREHENSIVE METABOLIC PANEL
ALK PHOS: 72 U/L (ref 39–117)
ALT: 10 U/L (ref 0–35)
AST: 17 U/L (ref 0–37)
Albumin: 4.5 g/dL (ref 3.5–5.2)
BILIRUBIN TOTAL: 0.6 mg/dL (ref 0.2–1.2)
BUN: 19 mg/dL (ref 6–23)
CALCIUM: 9.4 mg/dL (ref 8.4–10.5)
CO2: 28 meq/L (ref 19–32)
CREATININE: 1.14 mg/dL (ref 0.40–1.20)
Chloride: 104 mEq/L (ref 96–112)
GFR: 49.86 mL/min — AB (ref >60.00)
GLUCOSE: 57 mg/dL — AB (ref 70–99)
Potassium: 3.5 mEq/L (ref 3.5–5.1)
Sodium: 141 mEq/L (ref 135–145)
TOTAL PROTEIN: 7.6 g/dL (ref 6.0–8.3)

## 2018-09-11 LAB — TSH: TSH: 1.33 u[IU]/mL (ref 0.35–4.50)

## 2018-09-11 LAB — VITAMIN D 25 HYDROXY (VIT D DEFICIENCY, FRACTURES): VITD: 27.9 ng/mL — ABNORMAL LOW (ref 30.00–100.00)

## 2018-09-11 NOTE — Progress Notes (Signed)
Patient ID: Carol Beard, female   DOB: [birth year], 72 y.o.   MRN: [id number]   Subjective:    Patient ID: Carol Beard, female    DOB: 1946/06/05, 72 y.o.   MRN: [id number]  HPI  Patient with past history of primary progressive aphasia and thyroid nodule.  She comes in today to follow up on these issues as well as for a complete physical exam.  She is living with a friend.  She helps her out.  She reports she is doing relatively well.  Eating. Trying to stay active.  Sees neurology for her progressive aphasia. On aricept and namenda.  No chest pain.  No sob.  No acid reflux.  No abdominal pain.  Bowels moving.  No urine change.  States she saw ent.  Will obtain records.     Past Medical History:  Diagnosis Date  . Environmental allergies   . Thyroid nodule    biopsy x 2 negative   Past Surgical History:  Procedure Laterality Date  . BREAST BIOPSY Left 1971   fibroadenoma, left breast  . TONSILLECTOMY     Family History  Problem Relation Age of Onset  . Cancer Mother        Colon  . Stroke Father   . Breast cancer Neg Hx    Social History   Socioeconomic History  . Marital status: Married    Spouse name: Not on file  . Number of children: 0  . Years of education: Not on file  . Highest education level: Not on file  Occupational History  . Not on file  Social Needs  . Financial resource strain: Not hard at all  . Food insecurity:    Worry: Never true    Inability: Never true  . Transportation needs:    Medical: No    Non-medical: No  Tobacco Use  . Smoking status: Never Smoker  . Smokeless tobacco: Never Used  Substance and Sexual Activity  . Alcohol use: No    Alcohol/week: 0.0 standard drinks    Comment: occasional wine with dinner  . Drug use: No  . Sexual activity: Never  Lifestyle  . Physical activity:    Days per week: Not on file    Minutes per session: Not on file  . Stress: Not at all  Relationships  . Social connections:    Talks on  phone: Not on file    Gets together: Not on file    Attends religious service: Not on file    Active member of club or organization: Not on file    Attends meetings of clubs or organizations: Not on file    Relationship status: Not on file  Other Topics Concern  . Not on file  Social History Narrative  . Not on file    Outpatient Encounter Medications as of 09/11/2018  Medication Sig  . aspirin EC 81 MG tablet Take 81 mg by mouth daily.   Marland Kitchen donepezil (ARICEPT) 10 MG tablet Take 1 tablet by mouth daily.  . memantine (NAMENDA) 5 MG tablet 5 mg 2 (two) times daily.   . Polyvinyl Alcohol-Povidone (REFRESH OP) Apply to eye.  Marland Kitchen SALINE NASAL SPRAY NA Place into the nose as needed.  . vitamin E 400 UNIT capsule Take 400 Units by mouth daily.  . [DISCONTINUED] Calcium Carbonate (CALTRATE 600 PO) Take by mouth.   No facility-administered encounter medications on file as of 09/11/2018.     Review of Systems  Constitutional: Negative  for appetite change and unexpected weight change.  HENT: Negative for congestion and sinus pressure.   Eyes: Negative for pain and visual disturbance.  Respiratory: Negative for cough, chest tightness and shortness of breath.   Cardiovascular: Negative for chest pain, palpitations and leg swelling.  Gastrointestinal: Negative for abdominal pain, diarrhea, nausea and vomiting.  Genitourinary: Negative for difficulty urinating and dysuria.  Musculoskeletal: Negative for joint swelling and myalgias.  Skin: Negative for color change and rash.  Neurological: Negative for dizziness, light-headedness and headaches.  Hematological: Negative for adenopathy. Does not bruise/bleed easily.  Psychiatric/Behavioral: Negative for agitation and dysphoric mood.       Objective:    Physical Exam  Constitutional: She is oriented to person, place, and time. She appears well-developed and well-nourished. No distress.  HENT:  Nose: Nose normal.  Mouth/Throat: Oropharynx is  clear and moist.  Eyes: Right eye exhibits no discharge. Left eye exhibits no discharge. No scleral icterus.  Neck: Neck supple.  Cardiovascular: Normal rate and regular rhythm.  Pulmonary/Chest: Breath sounds normal. No accessory muscle usage. No tachypnea. No respiratory distress. She has no decreased breath sounds. She has no wheezes. She has no rhonchi. Right breast exhibits no inverted nipple, no mass, no nipple discharge and no tenderness (no axillary adenopathy). Left breast exhibits no inverted nipple, no mass, no nipple discharge and no tenderness (no axilarry adenopathy).  Abdominal: Soft. Bowel sounds are normal. There is no tenderness.  Musculoskeletal: She exhibits no edema or tenderness.  Lymphadenopathy:    She has no cervical adenopathy.  Neurological: She is alert and oriented to person, place, and time.  Skin: No rash noted. No erythema.  Psychiatric: She has a normal mood and affect. Her behavior is normal.    BP 100/74 (BP Location: Left Arm, Patient Position: Sitting, Cuff Size: Normal)   Pulse 74   Temp 98.5 F (36.9 C) (Oral)   Resp 15   Ht 5' 2.75" (1.594 m)   Wt 100 lb (45.4 kg)   SpO2 98%   BMI 17.86 kg/m  Wt Readings from Last 3 Encounters:  09/11/18 100 lb (45.4 kg)  06/08/18 99 lb (44.9 kg)  03/06/18 98 lb 9.6 oz (44.7 kg)     Lab Results  Component Value Date   WBC 4.8 09/11/2018   HGB 14.4 09/11/2018   HCT 42.0 09/11/2018   PLT 198.0 09/11/2018   GLUCOSE 57 (L) 09/11/2018   CHOL 204 (H) 09/11/2018   TRIG 116.0 09/11/2018   HDL 78.50 09/11/2018   LDLCALC 102 (H) 09/11/2018   ALT 10 09/11/2018   AST 17 09/11/2018   NA 141 09/11/2018   K 3.5 09/11/2018   CL 104 09/11/2018   CREATININE 1.14 09/11/2018   BUN 19 09/11/2018   CO2 28 09/11/2018   TSH 1.33 09/11/2018    Mm Digital Screening Bilateral  Result Date: 08/01/2017 CLINICAL DATA:  Screening. EXAM: DIGITAL SCREENING BILATERAL MAMMOGRAM WITH CAD COMPARISON:  Previous exam(s). ACR  Breast Density Category b: There are scattered areas of fibroglandular density. FINDINGS: There are no findings suspicious for malignancy. Images were processed with CAD. IMPRESSION: No mammographic evidence of malignancy. A result letter of this screening mammogram will be mailed directly to the patient. RECOMMENDATION: Screening mammogram in one year. (Code:SM-B-01Y) BI-RADS CATEGORY  1: Negative. Electronically Signed   By: Lajean Manes M.D.   On: 08/01/2017 12:48       Assessment & Plan:   Problem List Items Addressed This Visit    Health  care maintenance    Physical today 09/11/18.  Colonoscopy 10/2013.  recommended f/u in 2019.  Needs f/u.  Schedule mammogram.   Last 07/2017.         Memory change    Saw neurology.  Taking namenda and aricept.  Follow.        Osteoporosis    Check vitamin D level.       Relevant Medications   vitamin E 400 UNIT capsule   Other Relevant Orders   VITAMIN D 25 Hydroxy (Vit-D Deficiency, Fractures) (Completed)   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[place])    Followed by neurology.  Appears to be stable.       Thyroid nodule    S/p two biopsies.  Negative.  Saw ENT.  States evaluated recently - since her last visit.  Obtain records.        Weight loss    Previous weight loss.  Stable this check.  Check routine labs.        Relevant Orders   CBC with Differential/Platelet (Completed)   Comprehensive metabolic panel (Completed)   TSH (Completed)    Other Visit Diagnoses    Breast cancer screening    -  Primary   Relevant Orders   MM 3D SCREEN BREAST BILATERAL   Screening cholesterol level       Relevant Orders   Lipid panel (Completed)       Einar Pheasant, MD

## 2018-09-14 ENCOUNTER — Other Ambulatory Visit: Payer: Self-pay | Admitting: Internal Medicine

## 2018-09-14 DIAGNOSIS — R944 Abnormal results of kidney function studies: Secondary | ICD-10-CM

## 2018-09-14 NOTE — Progress Notes (Signed)
Order placed for f/u met b 

## 2018-09-20 ENCOUNTER — Encounter: Payer: Self-pay | Admitting: Internal Medicine

## 2018-09-20 NOTE — Assessment & Plan Note (Signed)
Previous weight loss.  Stable this check.  Check routine labs.

## 2018-09-20 NOTE — Assessment & Plan Note (Signed)
Physical today 09/11/18.  Colonoscopy 10/2013.  recommended f/u in 2019.  Needs f/u.  Schedule mammogram.   Last 07/2017.

## 2018-09-20 NOTE — Assessment & Plan Note (Signed)
Saw neurology.  Taking namenda and aricept.  Follow.

## 2018-09-20 NOTE — Assessment & Plan Note (Signed)
Followed by neurology. Appears to be stable.  

## 2018-09-20 NOTE — Assessment & Plan Note (Signed)
S/p two biopsies.  Negative.  Saw ENT.  States evaluated recently - since her last visit.  Obtain records.

## 2018-09-20 NOTE — Assessment & Plan Note (Signed)
Check vitamin D level 

## 2018-09-25 ENCOUNTER — Ambulatory Visit
Admission: RE | Admit: 2018-09-25 | Discharge: 2018-09-25 | Disposition: A | Discharge status: Home / Self Care | Payer: Medicare Other | Source: Ambulatory Visit | Attending: Internal Medicine | Admitting: Internal Medicine

## 2018-09-25 ENCOUNTER — Other Ambulatory Visit: Payer: Self-pay | Admitting: Internal Medicine

## 2018-09-25 DIAGNOSIS — Z1231 Encounter for screening mammogram for malignant neoplasm of breast: Secondary | ICD-10-CM

## 2018-09-25 DIAGNOSIS — Z1239 Encounter for other screening for malignant neoplasm of breast: Secondary | ICD-10-CM

## 2018-10-05 ENCOUNTER — Other Ambulatory Visit (INDEPENDENT_AMBULATORY_CARE_PROVIDER_SITE_OTHER): Payer: Medicare Other

## 2018-10-05 DIAGNOSIS — R944 Abnormal results of kidney function studies: Secondary | ICD-10-CM

## 2018-10-05 LAB — BASIC METABOLIC PANEL
BUN: 21 mg/dL (ref 6–23)
CHLORIDE: 104 meq/L (ref 96–112)
CO2: 29 meq/L (ref 19–32)
Calcium: 9.5 mg/dL (ref 8.4–10.5)
Creatinine, Ser: 1.13 mg/dL (ref 0.40–1.20)
GFR: 50.36 mL/min — ABNORMAL LOW (ref >60.00)
Glucose, Bld: 90 mg/dL (ref 70–99)
Potassium: 4.1 mEq/L (ref 3.5–5.1)
SODIUM: 141 meq/L (ref 135–145)

## 2018-11-26 ENCOUNTER — Encounter: Payer: Self-pay | Admitting: Emergency Medicine

## 2018-11-26 ENCOUNTER — Emergency Department
Admission: EM | Admit: 2018-11-26 | Discharge: 2018-11-26 | Disposition: A | Discharge status: Home / Self Care | Payer: Medicare Other | Attending: Student in an Organized Health Care Education/Training Program | Admitting: Student in an Organized Health Care Education/Training Program

## 2018-11-26 ENCOUNTER — Other Ambulatory Visit: Payer: Self-pay

## 2018-11-26 ENCOUNTER — Emergency Department: Payer: Medicare Other

## 2018-11-26 DIAGNOSIS — R198 Other specified symptoms and signs involving the digestive system and abdomen: Secondary | ICD-10-CM | POA: Insufficient documentation

## 2018-11-26 DIAGNOSIS — R1915 Other abnormal bowel sounds: Secondary | ICD-10-CM | POA: Diagnosis not present

## 2018-11-26 DIAGNOSIS — Z7982 Long term (current) use of aspirin: Secondary | ICD-10-CM | POA: Insufficient documentation

## 2018-11-26 DIAGNOSIS — R630 Anorexia: Secondary | ICD-10-CM | POA: Insufficient documentation

## 2018-11-26 DIAGNOSIS — Z79899 Other long term (current) drug therapy: Secondary | ICD-10-CM | POA: Diagnosis not present

## 2018-11-26 HISTORY — DX: Aphasia: R47.01

## 2018-11-26 LAB — BASIC METABOLIC PANEL
ANION GAP: 7 (ref 5–15)
BUN: 21 mg/dL (ref 8–23)
CALCIUM: 9.1 mg/dL (ref 8.9–10.3)
CO2: 26 mmol/L (ref 22–32)
CREATININE: 0.92 mg/dL (ref 0.44–1.00)
Chloride: 109 mmol/L (ref 98–111)
GFR calc non Af Amer: >60 mL/min (ref >60)
Glucose, Bld: 124 mg/dL — ABNORMAL HIGH (ref 70–99)
Potassium: 3.2 mmol/L — ABNORMAL LOW (ref 3.5–5.1)
Sodium: 142 mmol/L (ref 135–145)

## 2018-11-26 LAB — CBC WITH DIFFERENTIAL/PLATELET
ABS IMMATURE GRANULOCYTES: 0.02 10*3/uL (ref 0.00–0.07)
BASOS ABS: 0.1 10*3/uL (ref 0.0–0.1)
BASOS PCT: 1 %
EOS ABS: 0 10*3/uL (ref 0.0–0.5)
Eosinophils Relative: 1 %
HEMATOCRIT: 42.9 % (ref 36.0–46.0)
Hemoglobin: 14.3 g/dL (ref 12.0–15.0)
IMMATURE GRANULOCYTES: 0 %
LYMPHS ABS: 0.8 10*3/uL (ref 0.7–4.0)
LYMPHS PCT: 14 %
MCH: 31.8 pg (ref 26.0–34.0)
MCHC: 33.3 g/dL (ref 30.0–36.0)
MCV: 95.3 fL (ref 80.0–100.0)
MONOS PCT: 6 %
Monocytes Absolute: 0.3 10*3/uL (ref 0.1–1.0)
NEUTROS ABS: 4.7 10*3/uL (ref 1.7–7.7)
Neutrophils Relative %: 78 %
Platelets: 172 10*3/uL (ref 150–400)
RBC: 4.5 MIL/uL (ref 3.87–5.11)
RDW: 13 % (ref 11.5–15.5)
WBC: 6 10*3/uL (ref 4.0–10.5)
nRBC: 0 % (ref 0.0–0.2)

## 2018-11-26 LAB — URINALYSIS, COMPLETE (UACMP) WITH MICROSCOPIC
BACTERIA UA: NONE SEEN
BILIRUBIN URINE: NEGATIVE
Glucose, UA: NEGATIVE mg/dL
Ketones, ur: NEGATIVE mg/dL
NITRITE: NEGATIVE
PH: 5 (ref 5.0–8.0)
PROTEIN: NEGATIVE mg/dL
Specific Gravity, Urine: 1.025 (ref 1.005–1.030)

## 2018-11-26 LAB — HEPATIC FUNCTION PANEL
ALBUMIN: 4.2 g/dL (ref 3.5–5.0)
ALT: 22 U/L (ref 0–44)
AST: 24 U/L (ref 15–41)
Alkaline Phosphatase: 70 U/L (ref 38–126)
Bilirubin, Direct: <0.1 mg/dL (ref 0.0–0.2)
TOTAL PROTEIN: 7.3 g/dL (ref 6.5–8.1)
Total Bilirubin: 0.7 mg/dL (ref 0.3–1.2)

## 2018-11-26 MED ORDER — POLYETHYLENE GLYCOL 3350 17 G PO PACK: 17.0000 g | PACK | Freq: Every day | ORAL | 0 refills | Status: DC

## 2018-11-26 NOTE — ED Triage Notes (Signed)
Pt to ED via POV c/o loss of appetite x 3 weeks. Pts friend reports that pt has only been eating toast. Pt denies N/V/D. Pt denies fever and abdominal pain. Pt is in NAD at this time.

## 2018-11-26 NOTE — ED Provider Notes (Signed)
[facility] Emergency Department Provider Note    First MD Initiated Contact with Patient 11/26/18 1226     (approximate)  I have reviewed the triage vital signs and the nursing notes.   HISTORY  Chief Complaint Anorexia    HPI Carol Beard is a 72 y.o. female low listed past medical history presents the ER for 3 weeks of decreased oral intake where she is able to eat and drink but states that she only wants to eat toast with peanut butter and jelly.  States that she can feel rumbling in her stomach.  She is moving her bowels normally.  Denies any pain.  Family friend is here with her was worried she was dehydrated but patient otherwise states she does not have any symptoms.  No lightheadedness.  No headaches.  No chest pain or shortness of breath.  Does not have any pain or discomfort with eating.  She not having any choking episodes.    Past Medical History:  Diagnosis Date  . Aphasia   . Environmental allergies   . Thyroid nodule    biopsy x 2 negative   Family History  Problem Relation Age of Onset  . Cancer Mother        Colon  . Stroke Father   . Breast cancer Neg Hx    Past Surgical History:  Procedure Laterality Date  . BREAST EXCISIONAL BIOPSY Left 1971   fibroadenoma, left breast  . TONSILLECTOMY     Patient Active Problem List   Diagnosis Date Noted  . Osteoporosis 09/11/2018  . 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 Breast nodule 12/24/2015  . Headache 12/24/2015  . Health care maintenance 06/18/2015  . Environmental allergies 06/18/2015  . Toenail fungus 07/31/2014  . Weight loss 03/28/2014  . History of colonic polyps 10/28/2013  . Knee pain 09/09/2013  . Skin lesion 09/09/2013  . Memory change 06/18/2013  . Hand numbness 06/18/2013  . Fullness of breast 06/16/2013  . Thyroid nodule 01/13/2013      Prior to Admission medications   Medication Sig Start Date End Date Taking? Authorizing Provider    aspirin EC 81 MG tablet Take 81 mg by mouth daily.     [provider]  donepezil (ARICEPT) 10 MG tablet Take 1 tablet by mouth daily. 12/30/16   [provider]  memantine (NAMENDA) 5 MG tablet 5 mg 2 (two) times daily.  08/31/18   [provider]  polyethylene glycol (MIRALAX / GLYCOLAX) packet Take 17 g by mouth daily. Mix one tablespoon with 8oz of your favorite juice or water every day until you are having soft formed stools. Then start taking once daily if you didn't have a stool the day before. 11/26/18   Merlyn Lot, MD  Polyvinyl Alcohol-Povidone ([place] [facility]) Apply to eye.    [provider]  SALINE NASAL SPRAY NA Place into the nose as needed.    [provider]  vitamin E 400 UNIT capsule Take 400 Units by mouth daily.    [provider]    Allergies Penicillins    Social History Social History   Tobacco Use  . Smoking status: Never Smoker  . Smokeless tobacco: Never Used  Substance Use Topics  . Alcohol use: No    Alcohol/week: 0.0 standard drinks    Comment: occasional wine with dinner  . Drug use: No    Review of Systems Patient denies headaches, rhinorrhea, blurry vision, numbness, shortness of breath, chest  pain, edema, cough, abdominal pain, nausea, vomiting, diarrhea, dysuria, fevers, rashes or hallucinations unless otherwise stated above in HPI. ____________________________________________   PHYSICAL EXAM:  VITAL SIGNS: Vitals:   11/26/18 1230 11/26/18 1300  BP: 135/67 140/68  Pulse: (!) 59   Resp:    Temp:    SpO2: 99%     Constitutional: Alert ,pleasant and in NAD.  Eyes: Conjunctivae are normal.  Head: Atraumatic. Nose: No congestion/rhinnorhea. Mouth/Throat: Mucous membranes are moist.   Neck: No stridor. Painless ROM.  Cardiovascular: Normal rate, regular rhythm. Grossly normal heart sounds.  Good peripheral circulation. Respiratory: Normal respiratory effort.  No retractions. Lungs  CTAB. Gastrointestinal: Soft and nontender in all four quadrants No distention. No abdominal bruits. No CVA tenderness. Genitourinary:  Musculoskeletal: No lower extremity tenderness nor edema.  No joint effusions. Neurologic:  Normal speech and language. No gross focal neurologic deficits are appreciated. No facial droop Skin:  Skin is warm, dry and intact. No rash noted. Psychiatric: Mood and affect are normal. Speech and behavior are normal.  ____________________________________________   LABS (all labs ordered are listed, but only abnormal results are displayed)  Results for orders placed or performed during the hospital encounter of 11/26/18 (from the past 24 hour(s))  CBC with Differential     Status: None   Collection Time: 11/26/18 11:13 AM  Result Value Ref Range   WBC 6.0 4.0 - 10.5 K/uL   RBC 4.50 3.87 - 5.11 MIL/uL   Hemoglobin 14.3 12.0 - 15.0 g/dL   HCT 42.9 36.0 - 46.0 %   MCV 95.3 80.0 - 100.0 fL   MCH 31.8 26.0 - 34.0 pg   MCHC 33.3 30.0 - 36.0 g/dL   RDW 13.0 11.5 - 15.5 %   Platelets 172 150 - 400 K/uL   nRBC 0.0 0.0 - 0.2 %   Neutrophils Relative % 78 %   Neutro Abs 4.7 1.7 - 7.7 K/uL   Lymphocytes Relative 14 %   Lymphs Abs 0.8 0.7 - 4.0 K/uL   Monocytes Relative 6 %   Monocytes Absolute 0.3 0.1 - 1.0 K/uL   Eosinophils Relative 1 %   Eosinophils Absolute 0.0 0.0 - 0.5 K/uL   Basophils Relative 1 %   Basophils Absolute 0.1 0.0 - 0.1 K/uL   Immature Granulocytes 0 %   Abs Immature Granulocytes 0.02 0.00 - 0.07 K/uL  Basic metabolic panel     Status: Abnormal   Collection Time: 11/26/18 11:13 AM  Result Value Ref Range   Sodium 142 135 - 145 mmol/L   Potassium 3.2 (L) 3.5 - 5.1 mmol/L   Chloride 109 98 - 111 mmol/L   CO2 26 22 - 32 mmol/L   Glucose, Bld 124 (H) 70 - 99 mg/dL   BUN 21 8 - 23 mg/dL   Creatinine, Ser 0.92 0.44 - 1.00 mg/dL   Calcium 9.1 8.9 - 10.3 mg/dL   GFR calc non Af Amer >60 >60 mL/min   GFR calc Af Amer >60 >60 mL/min   Anion  gap 7 5 - 15  Urinalysis, Complete w Microscopic     Status: Abnormal   Collection Time: 11/26/18 11:13 AM  Result Value Ref Range   Color, Urine YELLOW (A) YELLOW   APPearance CLEAR (A) CLEAR   Specific Gravity, Urine 1.025 1.005 - 1.030   pH 5.0 5.0 - 8.0   Glucose, UA NEGATIVE NEGATIVE mg/dL   Hgb urine dipstick SMALL (A) NEGATIVE   Bilirubin Urine NEGATIVE NEGATIVE   Ketones,  ur NEGATIVE NEGATIVE mg/dL   Protein, ur NEGATIVE NEGATIVE mg/dL   Nitrite NEGATIVE NEGATIVE   Leukocytes, UA TRACE (A) NEGATIVE   RBC / HPF 6-10 0 - 5 RBC/hpf   WBC, UA 0-5 0 - 5 WBC/hpf   Bacteria, UA NONE SEEN NONE SEEN   Squamous Epithelial / LPF 0-5 0 - 5   Mucus PRESENT    Hyaline Casts, UA PRESENT    Ca Oxalate Crys, UA PRESENT   Hepatic function panel     Status: None   Collection Time: 11/26/18 11:13 AM  Result Value Ref Range   Total Protein 7.3 6.5 - 8.1 g/dL   Albumin 4.2 3.5 - 5.0 g/dL   AST 24 15 - 41 U/L   ALT 22 0 - 44 U/L   Alkaline Phosphatase 70 38 - 126 U/L   Total Bilirubin 0.7 0.3 - 1.2 mg/dL   Bilirubin, Direct <0.1 0.0 - 0.2 mg/dL   Indirect Bilirubin NOT CALCULATED 0.3 - 0.9 mg/dL   ____________________________________________ ____________________________________________  RADIOLOGY  I personally reviewed all radiographic images ordered to evaluate for the above acute complaints and reviewed radiology reports and findings.  These findings were personally discussed with the patient.  Please see medical record for radiology report.  ____________________________________________   PROCEDURES  Procedure(s) performed:  Procedures    Critical Care performed: no ____________________________________________   INITIAL IMPRESSION / ASSESSMENT AND PLAN / ED COURSE  Pertinent labs & imaging results that were available during my care of the patient were reviewed by me and considered in my medical decision making (see chart for details).   DDX: Electrolyte abnormality,  dehydration, AKI, gastritis constipation  Carol Beard is a 72 y.o. who presents to the ED with symptoms as described above.  Very vague symptomatology but denies any pain nausea or vomiting.  Electrolytes ordered for the above differential did not show any evidence of AKI or electrolyte abnormality.  X-ray.  Discussed option for CT imaging though patient not having any pain prefer to follow-up with PCP regarding this.  Have low suspicion for diverticulitis or acute infectious or acute intra-abdominal process at this time.  Clinical Course as of Nov 27 1419  Thu Nov 26, 2018  1411 No evidence of obstructive bowel pattern but does have moderate stool burden.  No evidence of UTI.  This point believe she stable and appropriate for outpatient follow-up.   [PR]    Clinical Course User Index [PR] Merlyn Lot, MD     As part of my medical decision making, I reviewed the following data within the [place] [facility] notes reviewed and incorporated, Labs reviewed, notes from prior ED visits.  ____________________________________________   FINAL CLINICAL IMPRESSION(S) / ED DIAGNOSES  Final diagnoses:  [place]gmi  Decreased appetite      NEW MEDICATIONS STARTED DURING THIS VISIT:  New Prescriptions   POLYETHYLENE GLYCOL (MIRALAX / GLYCOLAX) PACKET    Take 17 g by mouth daily. Mix one tablespoon with 8oz of your favorite juice or water every day until you are having soft formed stools. Then start taking once daily if you didn't have a stool the day before.     Note:  This document was prepared using Dragon voice recognition software and may include unintentional dictation errors.    Merlyn Lot, MD 11/26/18 1420

## 2018-11-26 NOTE — Discharge Instructions (Addendum)
Be sure to eat at least one boost or Ensure shake daily to help supplement your nutrition.  Follow-up with PCP.  Return for any additional questions or concerns.

## 2018-11-26 NOTE — ED Notes (Signed)
Pt up to toilet 

## 2018-12-11 ENCOUNTER — Ambulatory Visit: Payer: Self-pay | Admitting: *Deleted

## 2018-12-11 NOTE — Telephone Encounter (Signed)
Just an FYI

## 2018-12-11 NOTE — Telephone Encounter (Signed)
Pt called with having fatigue and not wanting to eat anything but toast and jelly. She was seen in the ED on Dec 12th. They recommend the patient to try drinking boost.  Her fatigue is not interfering with her normal activities. She is voiding and feels like she is not dehydrated. Appointment scheduled for the first available at the office. Routing to flow at [facility] for review. Advised to call back for any increase in symptoms. Pt voiced understanding.  Reason for Disposition . [1] MILD weakness (i.e., does not interfere with ability to work, go to school, normal activities) AND [2] persists > 1 week  Answer Assessment - Initial Assessment Questions 1. DESCRIPTION: "Describe how you are feeling."     Run down 2. SEVERITY: "How bad is it?"  "Can you stand and walk?"   - MILD - Feels weak or tired, but does not interfere with work, school or normal activities   - [place] to stand and walk; weakness interferes with work, school, or normal activities   - SEVERE - Unable to stand or walk     mild 3. ONSET:  "When did the weakness begin?"     Longer than 3 weeks 4. CAUSE: "What do you think is causing the weakness?"     no 5. MEDICINES: "Have you recently started a new medicine or had a change in the amount of a medicine?"     no 6. OTHER SYMPTOMS: "Do you have any other symptoms?" (e.g., chest pain, fever, cough, SOB, vomiting, diarrhea, bleeding, other areas of pain)     no 7. PREGNANCY: "Is there any chance you are pregnant?" "When was your last menstrual period?"     no  Protocols used: WEAKNESS (GENERALIZED) AND FATIGUE-A-AH

## 2018-12-17 ENCOUNTER — Encounter: Payer: Self-pay | Admitting: Family Medicine

## 2018-12-17 ENCOUNTER — Ambulatory Visit (INDEPENDENT_AMBULATORY_CARE_PROVIDER_SITE_OTHER): Payer: Medicare Other | Admitting: Family Medicine

## 2018-12-17 VITALS — BP 110/62 | HR 74 | Temp 98.7°F | Ht 62.0 in | Wt 103.4 lb

## 2018-12-17 DIAGNOSIS — R413 Other amnesia: Secondary | ICD-10-CM

## 2018-12-17 DIAGNOSIS — R630 Anorexia: Secondary | ICD-10-CM

## 2018-12-17 DIAGNOSIS — E559 Vitamin D deficiency, unspecified: Secondary | ICD-10-CM | POA: Diagnosis not present

## 2018-12-17 LAB — COMPREHENSIVE METABOLIC PANEL
ALT: 22 U/L (ref 0–35)
AST: 21 U/L (ref 0–37)
Albumin: 4.3 g/dL (ref 3.5–5.2)
Alkaline Phosphatase: 77 U/L (ref 39–117)
BILIRUBIN TOTAL: 0.4 mg/dL (ref 0.2–1.2)
BUN: 24 mg/dL — ABNORMAL HIGH (ref 6–23)
CO2: 28 mEq/L (ref 19–32)
Calcium: 9.4 mg/dL (ref 8.4–10.5)
Chloride: 106 mEq/L (ref 96–112)
Creatinine, Ser: 1.09 mg/dL (ref 0.40–1.20)
GFR: 52.47 mL/min — ABNORMAL LOW (ref >60.00)
Glucose, Bld: 79 mg/dL (ref 70–99)
Potassium: 4.4 mEq/L (ref 3.5–5.1)
SODIUM: 141 meq/L (ref 135–145)
Total Protein: 7.4 g/dL (ref 6.0–8.3)

## 2018-12-17 LAB — CBC
HCT: 42.3 % (ref 36.0–46.0)
Hemoglobin: 14.3 g/dL (ref 12.0–15.0)
MCHC: 33.9 g/dL (ref 30.0–36.0)
MCV: 95.1 fl (ref 78.0–100.0)
Platelets: 201 10*3/uL (ref 150.0–400.0)
RBC: 4.45 Mil/uL (ref 3.87–5.11)
RDW: 14.2 % (ref 11.5–15.5)
WBC: 5.3 10*3/uL (ref 4.0–10.5)

## 2018-12-17 LAB — B12 AND FOLATE PANEL
FOLATE: 11.6 ng/mL (ref >5.9)
Vitamin B-12: 275 pg/mL (ref 211–911)

## 2018-12-17 LAB — THYROID PANEL WITH TSH
Free Thyroxine Index: 2 (ref 1.4–3.8)
T3 Uptake: 29 % (ref 22–35)
T4, Total: 6.9 ug/dL (ref 5.1–11.9)
TSH: 1.08 mIU/L (ref 0.40–4.50)

## 2018-12-17 LAB — VITAMIN D 25 HYDROXY (VIT D DEFICIENCY, FRACTURES): VITD: 30.01 ng/mL (ref 30.00–100.00)

## 2018-12-17 MED ORDER — MIRTAZAPINE 15 MG PO TABS: 7.5000 mg | ORAL_TABLET | Freq: Every day | ORAL | 1 refills | Status: DC

## 2018-12-17 NOTE — Progress Notes (Signed)
Subjective:    Patient ID: Carol Beard, female    DOB: 1946-04-02, 73 y.o.   MRN: [id number]  HPI  Presents to clinic with fatigue and loss of appetite for 3-4 weeks.  Patient and family member state, the only thing she really seems to be eating lately is toast with jelly and peanut butter, otherwise does not seem to have much appetite for anything else.  She is drinking no water throughout the day.  Patient states she thinks about food, enjoys eating food, but when it comes time to eat she only seems to have a taste for toast with jelly or toast with peanut butter.  Patient did go to the emergency department for this issue on November 26, 2018.  Lab work was done including CBC, BMP, urinalysis, hepatic panel and all these tests were unremarkable for abnormality.  Patient did not have any abdominal pain, so no imaging was done at that time.   Patient's cancer screenings are all up-to-date for her age.  Patient Active Problem List   Diagnosis Date Noted  . Osteoporosis 09/11/2018  . 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 Breast nodule 12/24/2015  . Headache 12/24/2015  . Health care maintenance 06/18/2015  . Environmental allergies 06/18/2015  . Toenail fungus 07/31/2014  . Weight loss 03/28/2014  . History of colonic polyps 10/28/2013  . Knee pain 09/09/2013  . Skin lesion 09/09/2013  . Memory change 06/18/2013  . Hand numbness 06/18/2013  . Fullness of breast 06/16/2013  . Thyroid nodule 01/13/2013   Social History   Tobacco Use  . Smoking status: Never Smoker  . Smokeless tobacco: Never Used  Substance Use Topics  . Alcohol use: No    Alcohol/week: 0.0 standard drinks    Comment: occasional wine with dinner   Review of Systems  Constitutional: Negative for chills, fatigue and fever.  HENT: Negative for congestion, ear pain, sinus pain and sore throat.   Eyes: Negative.   Respiratory: Negative for cough, shortness of breath and wheezing.     Cardiovascular: Negative for chest pain, palpitations and leg swelling.  Gastrointestinal: Negative for abdominal pain, diarrhea, nausea and vomiting. +decreased appetite.  Genitourinary: Negative for dysuria, frequency and urgency.  Musculoskeletal: Negative for arthralgias and myalgias.  Skin: Negative for color change, pallor and rash.  Neurological: Negative for syncope, light-headedness and headaches.  Psychiatric/Behavioral: The patient is not nervous/anxious.       Objective:   Physical Exam  Constitutional: She appears well-developed and well-nourished. No distress.  HENT:  Head: Normocephalic and atraumatic.  Eyes: Pupils are equal, round, and reactive to light. EOM are normal. No scleral icterus.  Neck: Normal range of motion. Neck supple. No tracheal deviation present.  Cardiovascular: Normal rate, regular rhythm and normal heart sounds.  Pulmonary/Chest: Effort normal and breath sounds normal. No respiratory distress. She has no wheezes. She has no rales.  Abdominal: Soft. Bowel sounds are normal. There is no tenderness.  Neurological: She is alert and oriented to person, place, and time.  Gait normal  Skin: Skin is warm and dry. No pallor.  Psychiatric: She has a normal mood and affect. Her behavior is normal. Thought content normal.   Nursing note and vitals reviewed.     Vitals:   12/17/18 0956  BP: 110/62  Pulse: 74  Temp: 98.7 F (37.1 C)  SpO2: 94%   Wt Readings from Last 3 Encounters:  12/17/18 103 lb 6.4 oz (46.9 kg)  09/11/18 100 lb (45.4 kg)  06/08/18 99 lb (44.9 kg)    Assessment & Plan:   Decreased appetite, memory change - patient's appetite has decreased over the past 4 weeks due to unknown causes.  Patient actually has gained 3 pounds since last visit in September 2019.  Patient given handout outlining higher calorie and higher protein foods to try and see if that helps stimulate appetite.  We will get repeat lab work today including CBC, CMP,  thyroid panel, vitamin B12/folic acid and vitamin D to see if any of these are abnormal.  Patient also given a few samples of Ensure shakes from our office supply, advised she can add ice cream to these to make them into an Ensure milkshake.  Patient also is agreeable to trial Remeron 7.5 mg/day to see if this helps stimulate appetite.  I am wondering if the decreased appetite could possibly be related to memory loss/early dementia.  Patient will follow back up here in approximately 4 weeks for recheck after adding Remeron to see if this made any difference in her appetite.

## 2018-12-17 NOTE — Patient Instructions (Signed)
Try pumping up the calories of ensure or boost shakes with some ice cream and peanutbutter to make a milkshake  High-Protein and High-Calorie Diet Eating high-protein and high-calorie foods can help you to gain weight, heal after an injury, and recover after an illness or surgery. The specific amount of daily protein and calories you need depends on:  Your body weight.  The reason this diet is recommended for you. What is my plan? Generally, a high-protein, high-calorie diet involves:  Eating 250-500 extra calories each day.  Making sure that you get enough of your daily calories from protein. Ask your health care provider how many of your calories should come from protein. Talk with a health care provider, such as a diet and nutrition specialist (dietitian), about how much protein and how many calories you need each day. Follow the diet as directed by your health care provider. What are tips for following this plan?  Preparing meals  Add whole milk, half-and-half, or heavy cream to cereal, pudding, soup, or hot cocoa.  Add whole milk to instant breakfast drinks.  Add peanut butter to oatmeal or smoothies.  Add powdered milk to baked goods, smoothies, or milkshakes.  Add powdered milk, cream, or butter to mashed potatoes.  Add cheese to cooked vegetables.  Make whole-milk yogurt parfaits. Top them with granola, fruit, or nuts.  Add cottage cheese to your fruit.  Add avocado, cheese, or both to sandwiches or salads.  Add meat, poultry, or seafood to rice, pasta, casseroles, salads, and soups.  Use mayonnaise when making egg salad, chicken salad, or tuna salad.  Use peanut butter as a dip for vegetables or as a topping for pretzels, celery, or crackers.  Add beans to casseroles, dips, and spreads.  Add pureed beans to sauces and soups.  Replace calorie-free drinks with calorie-containing drinks, such as milk and fruit juice.  Replace water with milk or heavy cream when  making foods such as oatmeal, pudding, or cocoa. General instructions  Ask your health care provider if you should take a nutritional supplement.  Try to eat six small meals each day instead of three large meals.  Eat a balanced diet. In each meal, include one food that is high in protein.  Keep nutritious snacks available, such as nuts, trail mixes, dried fruit, and yogurt.  If you have kidney disease or diabetes, talk with your health care provider about how much protein is safe for you. Too much protein may put extra stress on your kidneys.  Drink your calories. Choose high-calorie drinks and have them after your meals. What high-protein foods should I eat?  Vegetables Soybeans. Peas. Grains Quinoa. Bulgur wheat. Meats and other proteins Beef, pork, and poultry. Fish and seafood. Eggs. Tofu. Textured vegetable protein (TVP). Peanut butter. Nuts and seeds. Dried beans. Protein powders. Dairy Whole milk. Whole-milk yogurt. Powdered milk. Cheese. [employer]. Eggnog. Beverages High-protein supplement drinks. Soy milk. Other foods Protein bars. The items listed above may not be a complete list of high-protein foods and beverages. Contact a dietitian for more options. What high-calorie foods should I eat? Fruits Dried fruit. Fruit leather. Canned fruit in syrup. Fruit juice. Avocado. Vegetables Vegetables cooked in oil or butter. Fried potatoes. Grains Pasta. Quick breads. Muffins. Pancakes. Ready-to-eat cereal. Meats and other proteins Peanut butter. Nuts and seeds. Dairy Heavy cream. Whipped cream. Cream cheese. Sour cream. Ice cream. Custard. Pudding. Beverages Meal-replacement beverages. Nutrition shakes. Fruit juice. Sugar-sweetened soft drinks. Seasonings and condiments Salad dressing. Mayonnaise. Alfredo sauce. Fruit  preserves or jelly. Honey. Syrup. Sweets and desserts Cake. Cookies. Pie. Pastries. Candy bars. Chocolate. Fats and oils Butter or margarine. Oil.  Gravy. Other foods Meal-replacement bars. The items listed above may not be a complete list of high-calorie foods and beverages. Contact a dietitian for more options. Summary  A high-protein, high-calorie diet can help you gain weight or heal faster after an injury, illness, or surgery.  To increase your protein and calories, add ingredients such as whole milk, peanut butter, cheese, beans, meat, or seafood to meal items.  To get enough extra calories each day, include high-calorie foods and beverages at each meal.  Adding a high-calorie drink or shake can be an easy way to help you get enough calories each day. Talk with your healthcare provider or dietitian about the best options for you. This information is not intended to replace advice given to you by your health care provider. Make sure you discuss any questions you have with your health care provider. Document Released: 12/02/2005 Document Revised: 10/14/2017 Document Reviewed: 10/14/2017 Elsevier Interactive Patient Education  2019 [employer].

## 2018-12-30 ENCOUNTER — Encounter: Payer: Self-pay | Admitting: Lab

## 2019-01-21 ENCOUNTER — Ambulatory Visit: Payer: Medicare Other | Admitting: Family Medicine

## 2019-01-22 ENCOUNTER — Ambulatory Visit (INDEPENDENT_AMBULATORY_CARE_PROVIDER_SITE_OTHER): Payer: Medicare Other | Admitting: Family Medicine

## 2019-01-22 ENCOUNTER — Encounter: Payer: Self-pay | Admitting: Family Medicine

## 2019-01-22 VITALS — BP 112/72 | HR 73 | Temp 98.5°F | Resp 16 | Ht 62.0 in | Wt 108.6 lb

## 2019-01-22 DIAGNOSIS — R413 Other amnesia: Secondary | ICD-10-CM | POA: Diagnosis not present

## 2019-01-22 DIAGNOSIS — R630 Anorexia: Secondary | ICD-10-CM | POA: Diagnosis not present

## 2019-01-22 MED ORDER — MIRTAZAPINE 15 MG PO TABS: 15.0000 mg | ORAL_TABLET | Freq: Every day | ORAL | 1 refills | Status: DC

## 2019-01-22 NOTE — Progress Notes (Signed)
Subjective:    Patient ID: Carol Beard, female    DOB: Aug 04, 1946, 73 y.o.   MRN: [id number]  HPI   Patient presents to clinic for follow-up on fatigue and appetite loss after starting Remeron.  Overall she is doing much better in regards to eating various foods.  Previously she was only eating toast with jelly or toast with peanut butter, and not drinking much water.  Patient states she is eating more, and caregiver confirms this.  Caregiver states she is eating more of a variety, is drinking a boost usually every day or every other day.  Patient still has a tough time drinking enough water throughout the day.  Patient is trying to improve this.  Patient Active Problem List   Diagnosis Date Noted  . Osteoporosis 09/11/2018  . 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 Breast nodule 12/24/2015  . Headache 12/24/2015  . Health care maintenance 06/18/2015  . Environmental allergies 06/18/2015  . Toenail fungus 07/31/2014  . Weight loss 03/28/2014  . History of colonic polyps 10/28/2013  . Knee pain 09/09/2013  . Skin lesion 09/09/2013  . Memory change 06/18/2013  . Hand numbness 06/18/2013  . Fullness of breast 06/16/2013  . Thyroid nodule 01/13/2013   Social History   Tobacco Use  . Smoking status: Never Smoker  . Smokeless tobacco: Never Used  Substance Use Topics  . Alcohol use: No    Alcohol/week: 0.0 standard drinks    Comment: occasional wine with dinner    Review of Systems  Constitutional: Negative for chills, fatigue and fever.  HENT: Negative for congestion, ear pain, sinus pain and sore throat.   Eyes: Negative.   Respiratory: Negative for cough, shortness of breath and wheezing.   Cardiovascular: Negative for chest pain, palpitations and leg swelling.  Gastrointestinal: Negative for abdominal pain, diarrhea, nausea and vomiting.  Genitourinary: Negative for dysuria, frequency and urgency.  Musculoskeletal: Negative for arthralgias and myalgias.    Skin: Negative for color change, pallor and rash.  Neurological: Negative for syncope, light-headedness and headaches.  Psychiatric/Behavioral: The patient is not nervous/anxious.       Objective:   Physical Exam Vitals signs and nursing note reviewed.  Constitutional:      General: She is not in acute distress.    Appearance: She is not toxic-appearing.  HENT:     Head: Normocephalic and atraumatic.  Eyes:     General: No scleral icterus.    Extraocular Movements: Extraocular movements intact.     Pupils: Pupils are equal, round, and reactive to light.  Neck:     Musculoskeletal: Neck supple. No neck rigidity.  Cardiovascular:     Rate and Rhythm: Normal rate and regular rhythm.  Pulmonary:     Effort: Pulmonary effort is normal. No respiratory distress.     Breath sounds: Normal breath sounds.  Lymphadenopathy:     Cervical: No cervical adenopathy.  Skin:    General: Skin is warm and dry.     Coloration: Skin is not pale.  Neurological:     Mental Status: She is alert. Mental status is at baseline.  Psychiatric:        Mood and Affect: Mood normal.        Behavior: Behavior normal.    Wt Readings from Last 3 Encounters:  01/22/19 108 lb 9.6 oz (49.3 kg)  12/17/18 103 lb 6.4 oz (46.9 kg)  09/11/18 100 lb (45.4 kg)    Vitals:   01/22/19 1115  BP:  112/72  Pulse: 73  Resp: 16  Temp: 98.5 F (36.9 C)  SpO2: 96%      Assessment & Plan:   Decreased appetite/memory change - patient's blood work that was done at last visit did not reveal any obvious causes for her decreased appetite.  Suspect that is related to the natural progression of her memory changes.  The mirtazapine has been greatly helpful to her to increase appetite.  She has gained 5 pounds since last visit.  Encourage patient to continue to eat a variety of foods and suggested that she get a cup with a straw to help her want to drink more water throughout the day, having a cup with a straw makes it easier  to sip on the water.  Patient's caregiver requests that we make it so her tablets do not have to be cut in half.  We will increase dose from 7.5 mg of Remeron to 15 mg.    Patient will keep regularly scheduled appointment as planned with PCP in April 2020.  Advised to call sooner if any issues arise.

## 2019-03-18 ENCOUNTER — Ambulatory Visit: Payer: No Typology Code available for payment source | Admitting: Internal Medicine

## 2019-04-30 NOTE — Telephone Encounter (Signed)
error 

## 2019-05-14 ENCOUNTER — Ambulatory Visit (INDEPENDENT_AMBULATORY_CARE_PROVIDER_SITE_OTHER): Payer: Medicare Other | Admitting: Internal Medicine

## 2019-05-14 ENCOUNTER — Encounter: Payer: Self-pay | Admitting: Internal Medicine

## 2019-05-14 ENCOUNTER — Other Ambulatory Visit: Payer: Self-pay

## 2019-05-14 DIAGNOSIS — E041 Nontoxic single thyroid nodule: Secondary | ICD-10-CM | POA: Diagnosis not present

## 2019-05-14 DIAGNOSIS — R634 Abnormal weight loss: Secondary | ICD-10-CM | POA: Diagnosis not present

## 2019-05-14 DIAGNOSIS — G3101 Pick's disease: Secondary | ICD-10-CM | POA: Diagnosis not present

## 2019-05-14 DIAGNOSIS — R413 Other amnesia: Secondary | ICD-10-CM

## 2019-05-14 DIAGNOSIS — F0280 Dementia in other diseases classified elsewhere without behavioral disturbance: Secondary | ICD-10-CM

## 2019-05-14 NOTE — Progress Notes (Signed)
Patient ID: Carol Beard, female   DOB: 09-25-1946, 73 y.o.   MRN: [id number]   Virtual Visit via telephone Note  This visit type was conducted due to national recommendations for restrictions regarding the COVID-19 pandemic (e.g. social distancing).  This format is felt to be most appropriate for this patient at this time.  All issues noted in this document were discussed and addressed.  No physical exam was performed (except for noted visual exam findings with Video Visits).   I connected with Carol Beard today by telephone and verified that I am speaking with the correct person using two identifiers. Location patient: home Location provider: work  Persons participating in the telephone visit: patient, provider  I discussed the limitations, risks, security and privacy concerns of performing an evaluation and management service by telephone and the availability of in person appointments.  The patient expressed understanding and agreed to proceed.   Reason for visit: scheduled follow up.    HPI: She reports she is doing relatively well.  Was seen recently by Philis Nettle for decreased appetite and weight loss.  Was placed on remeron.  Appetite improved.  Per last note, weight improved.  Still taking the medication.  Friend staying with her.  Overall she feels better.  Trying to stay active.  No chest pain.  No sob.  No acid reflux.  No abdominal pain.  Bowels moving.  Has progressive aphasia.  Has seen neurology.  Also taking aricept and namenda.  Discussed previous thyroid w/up.  Saw ENT - Dr Pryor Ochoa.  Last evluated 04/2018.  Recommended f/u in one year.  Due f/u.  Discussed with her today.  She is in agreement.  Handling stress.     ROS: See pertinent positives and negatives per HPI.  Past Medical History:  Diagnosis Date  . Aphasia   . Environmental allergies   . Thyroid nodule    biopsy x 2 negative    Past Surgical History:  Procedure Laterality Date  . BREAST  EXCISIONAL BIOPSY Left 1971   fibroadenoma, left breast  . TONSILLECTOMY      Family History  Problem Relation Age of Onset  . Cancer Mother        Colon  . Stroke Father   . Breast cancer Neg Hx     SOCIAL HX: reviewed.    Current Outpatient Medications:  .  aspirin EC 81 MG tablet, Take 81 mg by mouth daily. , Disp: , Rfl:  .  donepezil (ARICEPT) 10 MG tablet, Take 1 tablet by mouth daily., Disp: , Rfl:  .  memantine (NAMENDA) 5 MG tablet, 5 mg 2 (two) times daily. , Disp: , Rfl:  .  mirtazapine (REMERON) 15 MG tablet, Take 1 tablet (15 mg total) by mouth at bedtime., Disp: 90 tablet, Rfl: 1 .  polyethylene glycol (MIRALAX / GLYCOLAX) packet, Take 17 g by mouth daily. Mix one tablespoon with 8oz of your favorite juice or water every day until you are having soft formed stools. Then start taking once daily if you didn't have a stool the day before., Disp: 30 each, Rfl: 0 .  Polyvinyl Alcohol-Povidone (REFRESH OP), Apply to eye., Disp: , Rfl:  .  vitamin E 400 UNIT capsule, Take 400 Units by mouth daily., Disp: , Rfl:   EXAM:  GENERAL: alert, oriented, appears well and in no acute distress  HEENT: atraumatic, conjunttiva clear, no obvious abnormalities on inspection of external nose and ears  NECK: normal movements of the head  and neck  LUNGS: on inspection no signs of respiratory distress, breathing rate appears normal, no obvious gross SOB, gasping or wheezing  CV: no obvious cyanosis  PSYCH/NEURO: pleasant and cooperative, no obvious depression or anxiety, speech and thought processing grossly intact  ASSESSMENT AND PLAN:  Discussed the following assessment and plan:  Memory change  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  Taking namenda and aricept.  Stable.    Primary progressive aphasia Followed by neurology.  Stable.    Weight loss Previous weight loss and decreased  appetite.  Started on remeron.  Continue.  Doing well on the medication.  Follow.    Thyroid nodule S/p two biopsies.  Negative.  Saw ENT.  Last evaluated 04/2018.  Recommended f/u in one year.  Due f/u.      I discussed the assessment and treatment plan with the patient. The patient was provided an opportunity to ask questions and all were answered. The patient agreed with the plan and demonstrated an understanding of the instructions.   The patient was advised to call back or seek an in-person evaluation if the symptoms worsen or if the condition fails to improve as anticipated.  I provided 15 minutes of non-face-to-face time during this encounter.   Einar Pheasant, MD

## 2019-05-16 ENCOUNTER — Encounter: Payer: Self-pay | Admitting: Internal Medicine

## 2019-05-16 NOTE — Assessment & Plan Note (Signed)
Followed by neurology.  Stable  

## 2019-05-16 NOTE — Assessment & Plan Note (Signed)
Previous weight loss and decreased appetite.  Started on remeron.  Continue.  Doing well on the medication.  Follow.

## 2019-05-16 NOTE — Assessment & Plan Note (Signed)
S/p two biopsies.  Negative.  Saw ENT.  Last evaluated 04/2018.  Recommended f/u in one year.  Due f/u.

## 2019-05-16 NOTE — Assessment & Plan Note (Signed)
Saw neurology.  Taking namenda and aricept.  Stable.

## 2019-06-07 ENCOUNTER — Other Ambulatory Visit: Payer: Self-pay | Admitting: Family Medicine

## 2019-06-07 DIAGNOSIS — R630 Anorexia: Secondary | ICD-10-CM

## 2019-06-09 ENCOUNTER — Other Ambulatory Visit: Payer: Self-pay | Admitting: Lab

## 2019-06-09 ENCOUNTER — Other Ambulatory Visit: Payer: Self-pay

## 2019-06-09 ENCOUNTER — Ambulatory Visit (INDEPENDENT_AMBULATORY_CARE_PROVIDER_SITE_OTHER): Payer: Medicare Other

## 2019-06-09 DIAGNOSIS — Z0000 Encounter for general adult medical examination without abnormal findings: Secondary | ICD-10-CM

## 2019-06-09 DIAGNOSIS — R630 Anorexia: Secondary | ICD-10-CM

## 2019-06-09 MED ORDER — MIRTAZAPINE 15 MG PO TABS: 7.5000 mg | ORAL_TABLET | Freq: Every day | ORAL | 1 refills | Status: DC

## 2019-06-09 NOTE — Patient Instructions (Addendum)
  Carol Beard , Thank you for taking time to come for your Medicare Wellness Visit. I appreciate your ongoing commitment to your health goals. Please review the following plan we discussed and let me know if I can assist you in the future.   These are the goals we discussed: Goals    . Follow up with Primary Care Provider     As needed Keep all routine maintenance appointments.       This is a list of the screening recommended for you and due dates:  Health Maintenance  Topic Date Due  .  Hepatitis C: One time screening is recommended by Center for Disease Control  (CDC) for  adults born from [birth year] through 1964.   [birth year]  . Tetanus Vaccine  03/03/1966  . Flu Shot  07/17/2019  . Mammogram  09/25/2020  . Colon Cancer Screening  10/22/2023  . DEXA scan (bone density measurement)  Completed  . Pneumonia vaccines  Discontinued

## 2019-06-09 NOTE — Telephone Encounter (Signed)
[facility]  [address] [place]   Requested 90 Day Supply Mirtazapine 15 MG Tablet Quantity 45 Take 1/2 Tablet by mouth at Bedtime

## 2019-06-09 NOTE — Progress Notes (Signed)
Subjective:   Carol Beard is a 73 y.o. female who presents for Medicare Annual (Subsequent) preventive examination.  Review of Systems:  No ROS.  Medicare Wellness Virtual Visit.  Visual/audio telehealth visit, UTA vital signs.   See social history for additional risk factors.   Cardiac Risk Factors include: advanced age (>50men, >58 women)     Objective:     Vitals: There were no vitals taken for this visit.  There is no height or weight on file to calculate BMI.  Advanced Directives 06/09/2019 11/26/2018 06/08/2018 11/29/2016 11/03/2015  Does Patient Have a Medical Advance Directive? No No No No No  Would patient like information on creating a medical advance directive? No - Patient declined No - Patient declined No - Patient declined - No - patient declined information    Tobacco Social History   Tobacco Use  Smoking Status Never Smoker  Smokeless Tobacco Never Used     Counseling given: Not Answered   Clinical Intake:  Pre-visit preparation completed: Yes        Diabetes: No  How often do you need to have someone help you when you read instructions, pamphlets, or other written materials from your doctor or pharmacy?: 5 - Always  Interpreter Needed?: No     Past Medical History:  Diagnosis Date  . Aphasia   . Environmental allergies   . Thyroid nodule    biopsy x 2 negative   Past Surgical History:  Procedure Laterality Date  . BREAST EXCISIONAL BIOPSY Left 1971   fibroadenoma, left breast  . TONSILLECTOMY     Family History  Problem Relation Age of Onset  . Cancer Mother        Colon  . Stroke Father   . Breast cancer Neg Hx    Social History   Socioeconomic History  . Marital status: Single    Spouse name: Not on file  . Number of children: 0  . Years of education: Not on file  . Highest education level: Not on file  Occupational History  . Not on file  Social Needs  . Financial resource strain: Not hard at all  . Food  insecurity    Worry: Never true    Inability: Never true  . Transportation needs    Medical: No    Non-medical: No  Tobacco Use  . Smoking status: Never Smoker  . Smokeless tobacco: Never Used  Substance and Sexual Activity  . Alcohol use: No    Alcohol/week: 0.0 standard drinks    Comment: occasional wine with dinner  . Drug use: No  . Sexual activity: Never  Lifestyle  . Physical activity    Days per week: Not on file    Minutes per session: Not on file  . Stress: Not at all  Relationships  . Social Herbalist on phone: Not on file    Gets together: Not on file    Attends religious service: Not on file    Active member of club or organization: Not on file    Attends meetings of clubs or organizations: Not on file    Relationship status: Not on file  Other Topics Concern  . Not on file  Social History Narrative  . Not on file    Outpatient Encounter Medications as of 06/09/2019  Medication Sig  . aspirin EC 81 MG tablet Take 81 mg by mouth daily.   Marland Kitchen donepezil (ARICEPT) 10 MG tablet Take 1 tablet by  mouth daily.  . memantine (NAMENDA) 5 MG tablet 5 mg 2 (two) times daily.   . mirtazapine (REMERON) 15 MG tablet TAKE 1/2 TABLET BY MOUTH AT BEDTIME  . polyethylene glycol (MIRALAX / GLYCOLAX) packet Take 17 g by mouth daily. Mix one tablespoon with 8oz of your favorite juice or water every day until you are having soft formed stools. Then start taking once daily if you didn't have a stool the day before.  . Polyvinyl Alcohol-Povidone (REFRESH OP) Apply to eye.  . vitamin E 400 UNIT capsule Take 400 Units by mouth daily.   No facility-administered encounter medications on file as of 06/09/2019.     Activities of Daily Living In your present state of health, do you have any difficulty performing the following activities: 06/09/2019  Hearing? N  Vision? N  Difficulty concentrating or making decisions? Y  Comment Followed by neurology  Walking or climbing stairs?  N  Dressing or bathing? N  Doing errands, shopping? Y  Comment She does not Physiological scientist and eating ? Y  Comment Family friend cooks. Self feeds.  Using the Toilet? N  In the past six months, have you accidently leaked urine? N  Do you have problems with loss of bowel control? N  Managing your Medications? Y  Comment Family friend manages  Managing your Finances? Y  Comment Family friend Engineer, production or managing your Housekeeping? Y  Comment Family friend manages  Some recent data might be hidden    Patient Care Team: Einar Pheasant, MD as PCP - General (Internal Medicine)    Assessment:   This is a routine wellness examination for Carol Beard.  I connected with patient and onsite caretaker, Carol Beard. HIPAA compliant as emergency contact. Verbal permission provided from patient. 06/09/19 at  9:40 AM EDT by an audio enabled telemedicine application and verified that I am speaking with the correct person using two identifiers. Patient stated full name and DOB. Patient gave permission to continue with virtual visit. Patient's location was at home and Nurse's location was at [place] office.   Carol Beard notes the patient becomes more anxious when things do not go as planned and would like to follow up with her doctor regarding this issue.  She also reports the need for part time home services for days she cannot be available due to self medical appointments or hospitalizations. Virtual appointment through caretakers smart phone is scheduled 06/11/19.  [phone].   Patient was pleasant in conversation during visit.  Reports sleeping well. Appetite is good and eats regular meals throughout the day. Active in the home and does not require assistance when ambulating.   Health Screenings  Mammogram - 09/2018 Colonoscopy - 10/2013 Bone Density - 06/2013 Glaucoma -none Hearing -demonstrates normal hearing during visit. Labs followed by pcp.  Cholesterol - 08/2018 Dental- self care  Vision- wears glasses  Social  Alcohol intake - no         Smoking history- never    Smokers in home? none Illicit drug use? none Diet - regular Sexually Active -never BMI- discussed the importance of a healthy diet, water intake and the benefits of aerobic exercise.  Educational material provided.   Safety  Patient feels safe at home- yes Patient does have smoke detectors at home- yes Patient does wear sunscreen or protective clothing when in direct sunlight -yes Patient does wear seat belt when in a moving vehicle -yes Patient has life alert- no Patient drives- no  [id number]-[id number] precautions and sickness  symptoms discussed.   Activities of Daily Living Patient denies needing assistance with: feeding themselves, getting from bed to chair, getting to the toilet, bathing/showering, dressing. Caretaker assists with managing the home, money and preparing meals.   Depression Screen Patient denies losing interest in daily life, feeling hopeless, or crying easily over simple problems.   Medication-taking as directed and without issues.   Fall Screen 1 fall in the last 3 weeks. No injury. Patient was reaching to pick up trash and tipped forward.   Memory Screen Patient is alert.  Followed by Neurology and pcp.  Taking Aricept and Namenda as directed due to memory change.   Immunizations The following Immunizations were discussed: Influenza, shingles, pneumonia, and tetanus.   Other Providers Patient Care Team: Einar Pheasant, MD as PCP - General (Internal Medicine)  Exercise Activities and Dietary recommendations Current Exercise Habits: The patient does not participate in regular exercise at present  Goals    . Follow up with Primary Care Provider     As needed Keep all routine maintenance appointments.       Fall Risk Fall Risk  06/09/2019 06/08/2018 08/30/2016 07/29/2014 06/18/2013  Falls in the past year? 1 No No No No  Number falls in past yr: 0 - - - -  Injury with  Fall? 0 - - - -  Comment She was leaning forward to pick up something and tipped over. - - - -   Depression Screen PHQ 2/9 Scores 06/09/2019 06/08/2018 08/30/2016 07/29/2014  PHQ - 2 Score 0 0 0 0     Cognitive Function MMSE - Mini Mental State Exam 06/09/2019 06/08/2018  Not completed: Unable to complete Unable to complete         There is no immunization history on file for this patient.  Screening Tests Health Maintenance  Topic Date Due  . Hepatitis C Screening  Feb 18, 1946  . TETANUS/TDAP  03/03/1966  . INFLUENZA VACCINE  07/17/2019  . MAMMOGRAM  09/25/2020  . COLONOSCOPY  10/22/2023  . DEXA SCAN  Completed  . PNA vac Low Risk Adult  Discontinued       Plan:   End of life planning; Advanced aging; Advanced directives discussed.  No HCPOA/Living Will.  Additional information declined at this time.  I have personally reviewed and noted the following in the patient's chart:   . Medical and social history . Use of alcohol, tobacco or illicit drugs  . Current medications and supplements . Functional ability and status . Nutritional status . Physical activity . Advanced directives . List of other physicians . Hospitalizations, surgeries, and ER visits in previous 12 months . Vitals . Screenings to include cognitive, depression, and falls . Referrals and appointments  In addition, I have reviewed and discussed with patient certain preventive protocols, quality metrics, and best practice recommendations. A written personalized care plan for preventive services as well as general preventive health recommendations were provided to patient.     Varney Biles, LPN  06/12/3150   Reviewed above information.  Agree with assessment and plan.  Follow up appt scheduled with me 06/11/19.   Dr Nicki Reaper

## 2019-06-11 ENCOUNTER — Other Ambulatory Visit: Payer: Self-pay

## 2019-06-11 ENCOUNTER — Ambulatory Visit (INDEPENDENT_AMBULATORY_CARE_PROVIDER_SITE_OTHER): Payer: Medicare Other | Admitting: Internal Medicine

## 2019-06-11 ENCOUNTER — Encounter: Payer: Self-pay | Admitting: Internal Medicine

## 2019-06-11 DIAGNOSIS — F419 Anxiety disorder, unspecified: Secondary | ICD-10-CM | POA: Diagnosis not present

## 2019-06-11 DIAGNOSIS — F0280 Dementia in other diseases classified elsewhere without behavioral disturbance: Secondary | ICD-10-CM | POA: Diagnosis not present

## 2019-06-11 DIAGNOSIS — G3101 Pick's disease: Secondary | ICD-10-CM | POA: Diagnosis not present

## 2019-06-11 NOTE — Progress Notes (Signed)
Patient ID: Carol Beard, female   DOB: October 30, 1946, 73 y.o.   MRN: [id number] .Marland Kitchen...   Virtual Visit via telephone Note  This visit type was conducted due to national recommendations for restrictions regarding the COVID-19 pandemic (e.g. social distancing).  This format is felt to be most appropriate for this patient at this time.  All issues noted in this document were discussed and addressed.  No physical exam was performed (except for noted visual exam findings with Video Visits).   I connected with Carol Beard) by telephone and verified that I am speaking with the correct person using two identifiers. Location patient: home Location provider: work  Persons participating in the telephone visit: patient, provider and pts friend Carol Beard.    I discussed the limitations, risks, security and privacy concerns of performing an evaluation and management service by telephone and the availability of in person appointments.  The patient expressed understanding and agreed to proceed.   Reason for visit: acute visit.    HPI: Discussed with pt and Carol Beard today.  Carol Beard is concerned that Carol Beard is having issues with increased anxiety.  Discussed with both of them today.  States the anxiety occurs if something throws her off her routine.  Also reports some issues with some obsessive compulsive tendencies.  Carol Beard denies being more anxious.  Also denies any OCD tendencies.  Discussed treatment options.  Discussed starting zoloft.  She declines to start.  States she does not feel she needs any further medication.  Eating well.  No nausea or vomiting.  Bowels moving.     ROS: See pertinent positives and negatives per HPI.  Past Medical History:  Diagnosis Date  . Aphasia   . Environmental allergies   . Thyroid nodule    biopsy x 2 negative    Past Surgical History:  Procedure Laterality Date  . BREAST EXCISIONAL BIOPSY Left 1971   fibroadenoma, left breast  . TONSILLECTOMY       Family History  Problem Relation Age of Onset  . Cancer Mother        Colon  . Stroke Father   . Breast cancer Neg Hx     SOCIAL HX: reviewed.    Current Outpatient Medications:  .  aspirin EC 81 MG tablet, Take 81 mg by mouth daily. , Disp: , Rfl:  .  donepezil (ARICEPT) 10 MG tablet, Take 1 tablet by mouth daily., Disp: , Rfl:  .  memantine (NAMENDA) 5 MG tablet, 5 mg 2 (two) times daily. , Disp: , Rfl:  .  mirtazapine (REMERON) 15 MG tablet, Take 0.5 tablets (7.5 mg total) by mouth at bedtime., Disp: 45 tablet, Rfl: 1 .  polyethylene glycol (MIRALAX / GLYCOLAX) packet, Take 17 g by mouth daily. Mix one tablespoon with 8oz of your favorite juice or water every day until you are having soft formed stools. Then start taking once daily if you didn't have a stool the day before., Disp: 30 each, Rfl: 0 .  Polyvinyl Alcohol-Povidone (REFRESH OP), Apply to eye., Disp: , Rfl:  .  vitamin E 400 UNIT capsule, Take 400 Units by mouth daily., Disp: , Rfl:   EXAM:  GENERAL: alert. Sounds to be in no acute distress.  Answering questions appropriately.    PSYCH/NEURO: pleasant and cooperative, no obvious depression or anxiety, speech and thought processing grossly intact  ASSESSMENT AND PLAN:  Discussed the following assessment and plan:  Primary progressive aphasia Followed by neurology.  Stable.  Anxiety Pt denies increased anxiety.  Discussed with her today.  She declines medication.  Will follow.  Notify me if changes her mind.      I discussed the assessment and treatment plan with the patient. The patient was provided an opportunity to ask questions and all were answered. The patient agreed with the plan and demonstrated an understanding of the instructions.   The patient was advised to call back or seek an in-person evaluation if the symptoms worsen or if the condition fails to improve as anticipated.  I provided 15 minutes of non-face-to-face time during this encounter.    Einar Pheasant, MD

## 2019-06-13 ENCOUNTER — Encounter: Payer: Self-pay | Admitting: Internal Medicine

## 2019-06-13 DIAGNOSIS — F419 Anxiety disorder, unspecified: Secondary | ICD-10-CM | POA: Insufficient documentation

## 2019-06-13 NOTE — Assessment & Plan Note (Signed)
Followed by neurology.  Stable  

## 2019-06-13 NOTE — Assessment & Plan Note (Signed)
Pt denies increased anxiety.  Discussed with her today.  She declines medication.  Will follow.  Notify me if changes her mind.

## 2019-09-01 DIAGNOSIS — E538 Deficiency of other specified B group vitamins: Secondary | ICD-10-CM | POA: Diagnosis not present

## 2019-09-02 ENCOUNTER — Other Ambulatory Visit: Payer: Self-pay | Admitting: Internal Medicine

## 2019-09-02 DIAGNOSIS — R630 Anorexia: Secondary | ICD-10-CM

## 2019-09-02 NOTE — Telephone Encounter (Signed)
Pt called in to request a refill for mirtazapine (REMERON) 15 MG tablet   Pharmacy:  [id number] - [place], [place] - [address] [phone] (Phone) [phone] (Fax)

## 2019-09-02 NOTE — Telephone Encounter (Signed)
Requested medication (s) are due for refill today: yes  Requested medication (s) are on the active medication list: yes  Last refill:  06/09/2019  Future visit scheduled: yes  Notes to clinic:  Review for refill   Requested Prescriptions  Pending Prescriptions Disp Refills   mirtazapine (REMERON) 15 MG tablet 45 tablet 1    Sig: Take 0.5 tablets (7.5 mg total) by mouth at bedtime.     Psychiatry: Antidepressants - mirtazapine Failed - 09/02/2019 10:06 AM      Failed - Total Cholesterol in normal range and within 360 days    Cholesterol  Date Value Ref Range Status  09/11/2018 204 (H) 0 - 200 mg/dL Final    Comment:    ATP III Classification       Desirable:  < 200 mg/dL               Borderline High:  200 - 239 mg/dL          High:  > = 240 mg/dL         Failed - Completed PHQ-2 or PHQ-9 in the last 360 days.      Passed - AST in normal range and within 360 days    AST  Date Value Ref Range Status  12/17/2018 21 0 - 37 U/L Final         Passed - ALT in normal range and within 360 days    ALT  Date Value Ref Range Status  12/17/2018 22 0 - 35 U/L Final         Passed - Triglycerides in normal range and within 360 days    Triglycerides  Date Value Ref Range Status  09/11/2018 116.0 0.0 - 149.0 mg/dL Final    Comment:    Normal:  <150 mg/dLBorderline High:  150 - 199 mg/dL         Passed - WBC in normal range and within 360 days    WBC  Date Value Ref Range Status  12/17/2018 5.3 4.0 - 10.5 K/uL Final         Passed - Valid encounter within last 6 months    Recent Outpatient Visits          2 months ago 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

## 2019-09-13 NOTE — Telephone Encounter (Signed)
Received request for refill of remeron.  Per medication list, it appears she should have a refill.  Lauren refilled #45 with one refill on 06/09/19.  Pt takes 1/2 tablet per day.  Please confirm with pharmacy pt has refill.

## 2019-09-23 ENCOUNTER — Encounter: Payer: Medicare Other | Admitting: Internal Medicine

## 2019-09-23 DIAGNOSIS — Z0289 Encounter for other administrative examinations: Secondary | ICD-10-CM

## 2019-09-23 NOTE — Telephone Encounter (Signed)
LMTCB

## 2019-10-20 ENCOUNTER — Telehealth: Payer: Self-pay | Admitting: Internal Medicine

## 2019-10-20 NOTE — Telephone Encounter (Signed)
Pt does have a history of dementia - has seen neurology (was prescribed namenda).  Also has a progressive aphasia.  I will work her in or do whatever you need me to do.  Just let me know.

## 2019-10-20 NOTE — Telephone Encounter (Signed)
Scheduled virtual for tomorrow at 4:30 DSS will be with patient to navigate visit. Faxing over FL2

## 2019-10-20 NOTE — Telephone Encounter (Signed)
Social services wants and [facility] for emergency placement, because her partner is her caregiver and that person is in ICU at this time, and patient has been placed in emergency protective custody. Patient has not been seen by PCP since 6/20. DSS Laurence Slate [phone] and advise if paperwork can be completed or needs appointment. DSS is having to provide around the clock care right now, until placed.

## 2019-10-21 ENCOUNTER — Ambulatory Visit (INDEPENDENT_AMBULATORY_CARE_PROVIDER_SITE_OTHER): Payer: Medicare Other | Admitting: Internal Medicine

## 2019-10-21 ENCOUNTER — Other Ambulatory Visit: Payer: Self-pay

## 2019-10-21 DIAGNOSIS — G3101 Pick's disease: Secondary | ICD-10-CM

## 2019-10-21 DIAGNOSIS — F0280 Dementia in other diseases classified elsewhere without behavioral disturbance: Secondary | ICD-10-CM

## 2019-10-21 DIAGNOSIS — R413 Other amnesia: Secondary | ICD-10-CM

## 2019-10-21 NOTE — Progress Notes (Signed)
Patient ID: Carol Beard, female   DOB: [birth year], 73 y.o.   MRN: [id number]   Virtual Visit via video Note  This visit type was conducted due to national recommendations for restrictions regarding the COVID-19 pandemic (e.g. social distancing).  This format is felt to be most appropriate for this patient at this time.  All issues noted in this document were discussed and addressed.  No physical exam was performed (except for noted visual exam findings with Video Visits).   I connected with Carol Beard by a video enabled telemedicine application and verified that I am speaking with the correct person using two identifiers. Location patient: home Location provider: work  Persons participating in the virtual visit: patient, provider and DSS worker  I discussed the limitations, risks, security and privacy concerns of performing an evaluation and management service by telephone and the availability of in person appointments.  The patient expressed understanding and agreed to proceed.   Reason for visit: work in appt.   HPI: Work in appt.  Her friend who lives with her and helps take care of her is in the hospital.  DSS was called out - given that she was by herself.  They are doing an emergent placement.  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  Also has been diagnosed with dementia.  Sees neurology.   Discussed with Carol Beard today.  She reports she is eating.  Denies any pain.  I explained to her that we would need to have her placed, until we can see how Carol Beard is going to do.  She expressed agreement.  Discussed with case worker.     ROS: See pertinent positives and negatives per HPI.  Past Medical History:  Diagnosis Date  . Aphasia   . Environmental allergies   . Thyroid nodule    biopsy x 2 negative    Past Surgical History:  Procedure Laterality Date  . BREAST EXCISIONAL BIOPSY Left 1971   fibroadenoma, left breast  . TONSILLECTOMY      Family History  Problem Relation Age  of Onset  . Cancer Mother        Colon  . Stroke Father   . Breast cancer Neg Hx     SOCIAL HX: reviewed.    Current Outpatient Medications:  .  memantine (NAMENDA) 10 MG tablet, Take 10 mg by mouth 2 (two) times daily., Disp: , Rfl:  .  aspirin EC 81 MG tablet, Take 81 mg by mouth daily. , Disp: , Rfl:  .  donepezil (ARICEPT) 10 MG tablet, Take 1 tablet by mouth daily., Disp: , Rfl:  .  mirtazapine (REMERON) 15 MG tablet, Take 0.5 tablets (7.5 mg total) by mouth at bedtime., Disp: 45 tablet, Rfl: 1 .  Polyvinyl Alcohol-Povidone (REFRESH OP), Apply to eye., Disp: , Rfl:   EXAM:  GENERAL: alert, oriented, appears well and in no acute distress  HEENT: atraumatic, conjunttiva clear, no obvious abnormalities on inspection of external nose and ears  NECK: normal movements of the head and neck  LUNGS: on inspection no signs of respiratory distress, breathing rate appears normal, no obvious gross SOB, gasping or wheezing  CV: no obvious cyanosis  PSYCH/NEURO: pleasant and cooperative, no obvious depression or anxiety, speech and thought processing grossly intact  ASSESSMENT AND PLAN:  Discussed the following assessment and plan:  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  Progression.  Discussed with case worker and explained her underlying issues with speaking, etc.    Memory  change Diagnosed with dementia - sees neurology.  On aricept and namenda.  She is being placed since her partner is in the hospital.  DSS apparently called and feel she needs to be placed.  I spoke to Carol Beard.  FL2 completed.      I discussed the assessment and treatment plan with the patient. The patient was provided an opportunity to ask questions and all were answered. The patient agreed with the plan and demonstrated an understanding of the instructions.   The patient was advised to call back or seek an in-person evaluation if the symptoms worsen or if the condition fails to improve  as anticipated.   Einar Pheasant, MD

## 2019-10-21 NOTE — Telephone Encounter (Signed)
Visit today with PCP. FL2 in process

## 2019-10-21 NOTE — Telephone Encounter (Signed)
Need to make sure we get FL2

## 2019-10-21 NOTE — Telephone Encounter (Signed)
Left message letting pt know that I have not received FL2

## 2019-10-22 ENCOUNTER — Other Ambulatory Visit: Payer: Self-pay | Admitting: *Deleted

## 2019-10-22 DIAGNOSIS — Z20822 Contact with and (suspected) exposure to covid-19: Secondary | ICD-10-CM

## 2019-10-22 NOTE — Telephone Encounter (Signed)
Left message to get fax number. Need to know where to send FL2

## 2019-10-22 NOTE — Telephone Encounter (Signed)
Jazuelin called in and gave me info as to where the fl2 needed to be faxed,  Fax numer  [phone] Attn [place] [employer]

## 2019-10-22 NOTE — Telephone Encounter (Signed)
Faxed to number [facility]

## 2019-10-23 LAB — SPECIMEN STATUS REPORT

## 2019-10-23 LAB — NOVEL CORONAVIRUS, NAA: SARS-CoV-2, NAA: NOT DETECTED

## 2019-10-24 ENCOUNTER — Encounter: Payer: Self-pay | Admitting: Internal Medicine

## 2019-10-24 NOTE — Assessment & Plan Note (Signed)
Diagnosed with dementia - sees neurology.  On aricept and namenda.  She is being placed since her partner is in the hospital.  DSS apparently called and feel she needs to be placed.  I spoke to [name].  FL2 completed.

## 2019-10-24 NOTE — Assessment & Plan Note (Signed)
Has been followed by neurology.  Progression.  Discussed with case worker and explained her underlying issues with speaking, etc.

## 2019-10-25 ENCOUNTER — Telehealth: Payer: Self-pay

## 2019-10-25 ENCOUNTER — Other Ambulatory Visit: Payer: Self-pay

## 2019-10-25 NOTE — Telephone Encounter (Signed)
Copied from [place] [id number]. Topic: General - Other >> Oct 25, 2019  8:54 AM Rainey Pines A wrote: Jeanice Lim from Social Services at [phone] is asking that patients covid test results be faxed to [phone] attn Jazueline in regards to patients emergency placement. Please advise

## 2019-10-25 NOTE — Telephone Encounter (Signed)
Faxed to [employer]

## 2019-12-07 DIAGNOSIS — Z1159 Encounter for screening for other viral diseases: Secondary | ICD-10-CM | POA: Diagnosis not present

## 2019-12-27 DIAGNOSIS — E041 Nontoxic single thyroid nodule: Secondary | ICD-10-CM | POA: Diagnosis not present

## 2019-12-27 DIAGNOSIS — R4701 Aphasia: Secondary | ICD-10-CM | POA: Diagnosis not present

## 2019-12-27 DIAGNOSIS — R451 Restlessness and agitation: Secondary | ICD-10-CM | POA: Diagnosis not present

## 2020-01-06 DIAGNOSIS — B351 Tinea unguium: Secondary | ICD-10-CM | POA: Diagnosis not present

## 2020-01-06 DIAGNOSIS — Z79899 Other long term (current) drug therapy: Secondary | ICD-10-CM | POA: Diagnosis not present

## 2020-01-06 DIAGNOSIS — M79674 Pain in right toe(s): Secondary | ICD-10-CM | POA: Diagnosis not present

## 2020-01-06 DIAGNOSIS — L84 Corns and callosities: Secondary | ICD-10-CM | POA: Diagnosis not present

## 2020-01-06 DIAGNOSIS — M79675 Pain in left toe(s): Secondary | ICD-10-CM | POA: Diagnosis not present

## 2020-01-13 DIAGNOSIS — Z79899 Other long term (current) drug therapy: Secondary | ICD-10-CM | POA: Diagnosis not present

## 2020-01-14 DIAGNOSIS — Z1159 Encounter for screening for other viral diseases: Secondary | ICD-10-CM | POA: Diagnosis not present

## 2020-01-31 DIAGNOSIS — E041 Nontoxic single thyroid nodule: Secondary | ICD-10-CM | POA: Diagnosis not present

## 2020-01-31 DIAGNOSIS — R4701 Aphasia: Secondary | ICD-10-CM | POA: Diagnosis not present

## 2020-01-31 DIAGNOSIS — R944 Abnormal results of kidney function studies: Secondary | ICD-10-CM | POA: Diagnosis not present

## 2020-01-31 DIAGNOSIS — R451 Restlessness and agitation: Secondary | ICD-10-CM | POA: Diagnosis not present

## 2020-02-23 DIAGNOSIS — R4701 Aphasia: Secondary | ICD-10-CM | POA: Diagnosis not present

## 2020-02-23 DIAGNOSIS — L309 Dermatitis, unspecified: Secondary | ICD-10-CM | POA: Diagnosis not present

## 2020-03-07 DIAGNOSIS — I739 Peripheral vascular disease, unspecified: Secondary | ICD-10-CM | POA: Diagnosis not present

## 2020-03-07 DIAGNOSIS — L84 Corns and callosities: Secondary | ICD-10-CM | POA: Diagnosis not present

## 2020-03-07 DIAGNOSIS — R269 Unspecified abnormalities of gait and mobility: Secondary | ICD-10-CM | POA: Diagnosis not present

## 2020-03-07 DIAGNOSIS — B351 Tinea unguium: Secondary | ICD-10-CM | POA: Diagnosis not present

## 2020-03-09 DIAGNOSIS — G4700 Insomnia, unspecified: Secondary | ICD-10-CM | POA: Diagnosis not present

## 2020-03-09 DIAGNOSIS — E782 Mixed hyperlipidemia: Secondary | ICD-10-CM | POA: Diagnosis not present

## 2020-03-09 DIAGNOSIS — Z79899 Other long term (current) drug therapy: Secondary | ICD-10-CM | POA: Diagnosis not present

## 2020-03-24 DIAGNOSIS — Z79899 Other long term (current) drug therapy: Secondary | ICD-10-CM | POA: Diagnosis not present

## 2020-04-03 DIAGNOSIS — R451 Restlessness and agitation: Secondary | ICD-10-CM | POA: Diagnosis not present

## 2020-04-03 DIAGNOSIS — R4701 Aphasia: Secondary | ICD-10-CM | POA: Diagnosis not present

## 2020-04-03 DIAGNOSIS — E782 Mixed hyperlipidemia: Secondary | ICD-10-CM | POA: Diagnosis not present

## 2020-04-03 DIAGNOSIS — E041 Nontoxic single thyroid nodule: Secondary | ICD-10-CM | POA: Diagnosis not present

## 2020-04-10 ENCOUNTER — Emergency Department: Payer: Medicare Other

## 2020-04-10 ENCOUNTER — Encounter: Payer: Self-pay | Admitting: Emergency Medicine

## 2020-04-10 ENCOUNTER — Emergency Department
Admission: EM | Admit: 2020-04-10 | Discharge: 2020-04-10 | Disposition: A | Discharge status: Skilled Nursing Facility | Payer: Medicare Other | Attending: Emergency Medicine | Admitting: Emergency Medicine

## 2020-04-10 DIAGNOSIS — Z79899 Other long term (current) drug therapy: Secondary | ICD-10-CM | POA: Insufficient documentation

## 2020-04-10 DIAGNOSIS — Z743 Need for continuous supervision: Secondary | ICD-10-CM | POA: Diagnosis not present

## 2020-04-10 DIAGNOSIS — R410 Disorientation, unspecified: Secondary | ICD-10-CM | POA: Diagnosis not present

## 2020-04-10 DIAGNOSIS — Z20822 Contact with and (suspected) exposure to covid-19: Secondary | ICD-10-CM | POA: Diagnosis not present

## 2020-04-10 DIAGNOSIS — M542 Cervicalgia: Secondary | ICD-10-CM | POA: Diagnosis not present

## 2020-04-10 DIAGNOSIS — F0390 Unspecified dementia without behavioral disturbance: Secondary | ICD-10-CM | POA: Diagnosis not present

## 2020-04-10 DIAGNOSIS — I700 Atherosclerosis of aorta: Secondary | ICD-10-CM | POA: Diagnosis not present

## 2020-04-10 DIAGNOSIS — R4182 Altered mental status, unspecified: Secondary | ICD-10-CM | POA: Diagnosis present

## 2020-04-10 DIAGNOSIS — Z7982 Long term (current) use of aspirin: Secondary | ICD-10-CM | POA: Diagnosis not present

## 2020-04-10 DIAGNOSIS — Z03818 Encounter for observation for suspected exposure to other biological agents ruled out: Secondary | ICD-10-CM | POA: Diagnosis not present

## 2020-04-10 DIAGNOSIS — I959 Hypotension, unspecified: Secondary | ICD-10-CM | POA: Diagnosis not present

## 2020-04-10 LAB — URINALYSIS, COMPLETE (UACMP) WITH MICROSCOPIC
Bacteria, UA: NONE SEEN
Bilirubin Urine: NEGATIVE
Glucose, UA: NEGATIVE mg/dL
Hgb urine dipstick: NEGATIVE
Ketones, ur: 5 mg/dL — AB
Leukocytes,Ua: NEGATIVE
Nitrite: NEGATIVE
Protein, ur: 100 mg/dL — AB
Specific Gravity, Urine: 1.025 (ref 1.005–1.030)
pH: 5 (ref 5.0–8.0)

## 2020-04-10 LAB — RESPIRATORY PANEL BY RT PCR (FLU A&B, COVID)
Influenza A by PCR: NEGATIVE
Influenza B by PCR: NEGATIVE
SARS Coronavirus 2 by RT PCR: NEGATIVE

## 2020-04-10 LAB — CBC
HCT: 40.4 % (ref 36.0–46.0)
Hemoglobin: 13.4 g/dL (ref 12.0–15.0)
MCH: 32.8 pg (ref 26.0–34.0)
MCHC: 33.2 g/dL (ref 30.0–36.0)
MCV: 99 fL (ref 80.0–100.0)
Platelets: 147 10*3/uL — ABNORMAL LOW (ref 150–400)
RBC: 4.08 MIL/uL (ref 3.87–5.11)
RDW: 13.1 % (ref 11.5–15.5)
WBC: 7 10*3/uL (ref 4.0–10.5)
nRBC: 0 % (ref 0.0–0.2)

## 2020-04-10 LAB — COMPREHENSIVE METABOLIC PANEL
ALT: 30 U/L (ref 0–44)
AST: 30 U/L (ref 15–41)
Albumin: 4 g/dL (ref 3.5–5.0)
Alkaline Phosphatase: 72 U/L (ref 38–126)
Anion gap: 10 (ref 5–15)
BUN: 34 mg/dL — ABNORMAL HIGH (ref 8–23)
CO2: 27 mmol/L (ref 22–32)
Calcium: 8.9 mg/dL (ref 8.9–10.3)
Chloride: 104 mmol/L (ref 98–111)
Creatinine, Ser: 1.24 mg/dL — ABNORMAL HIGH (ref 0.44–1.00)
GFR calc Af Amer: 50 mL/min — ABNORMAL LOW (ref >60)
GFR calc non Af Amer: 43 mL/min — ABNORMAL LOW (ref >60)
Glucose, Bld: 102 mg/dL — ABNORMAL HIGH (ref 70–99)
Potassium: 4.3 mmol/L (ref 3.5–5.1)
Sodium: 141 mmol/L (ref 135–145)
Total Bilirubin: 0.7 mg/dL (ref 0.3–1.2)
Total Protein: 7.2 g/dL (ref 6.5–8.1)

## 2020-04-10 LAB — TROPONIN I (HIGH SENSITIVITY): Troponin I (High Sensitivity): 4 ng/L (ref <18)

## 2020-04-10 MED ORDER — SODIUM CHLORIDE 0.9 % IV BOLUS
1000.0000 mL | Freq: Once | INTRAVENOUS | Status: AC
  Administered 2020-04-10: 16:00:00 1000 mL via INTRAVENOUS

## 2020-04-10 NOTE — ED Triage Notes (Signed)
Pt to ED by [facility] where staff state pt's baseline is altered. Pt also leaning to her left which is new per staff. Pt has hx of dementia.

## 2020-04-10 NOTE — ED Provider Notes (Signed)
[facility] Emergency Department Provider Note  Time seen: 2:47 PM  I have reviewed the triage vital signs and the nursing notes.   HISTORY  Chief Complaint Altered Mental Status   HPI Carol Beard is a 74 y.o. female with a past medical history of anxiety, dementia, presents from her nursing facility for altered mental status.  According to EMS per the nursing facility patient has baseline dementia but is typically much more interactive.  They have noticed today that the patient has been slumping to the left and much less interactive so they called EMS to transfer to the emergency department.  No reported fever vomiting or diarrhea.  Patient is unable to provide adequate history or review of systems at this time.  Patient does answer questions denies any pain.   Past Medical History:  Diagnosis Date  . Aphasia   . Environmental allergies   . Thyroid nodule    biopsy x 2 negative    Patient Active Problem List   Diagnosis Date Noted  . Anxiety 06/13/2019  . Osteoporosis 09/11/2018  . 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[place]) 07/19/2017  . Breast nodule 12/24/2015  . Headache 12/24/2015  . Health care maintenance 06/18/2015  . Environmental allergies 06/18/2015  . Toenail fungus 07/31/2014  . Weight loss 03/28/2014  . History of colonic polyps 10/28/2013  . Knee pain 09/09/2013  . Skin lesion 09/09/2013  . Memory change 06/18/2013  . Hand numbness 06/18/2013  . Fullness of breast 06/16/2013  . Thyroid nodule 01/13/2013    Past Surgical History:  Procedure Laterality Date  . BREAST EXCISIONAL BIOPSY Left 1971   fibroadenoma, left breast  . TONSILLECTOMY      Prior to Admission medications   Medication Sig Start Date End Date Taking? Authorizing Provider  aspirin EC 81 MG tablet Take 81 mg by mouth daily.     [provider]  donepezil (ARICEPT) 10 MG tablet Take 1 tablet by mouth daily. 12/30/16   [provider]  memantine  (NAMENDA) 10 MG tablet Take 10 mg by mouth 2 (two) times daily. 09/01/19 08/31/20  [provider]  mirtazapine (REMERON) 15 MG tablet Take 0.5 tablets (7.5 mg total) by mouth at bedtime. 06/09/19   Jodelle Green, FNP  Polyvinyl Alcohol-Povidone (REFRESH OP) Apply to eye.    [provider]    Allergies  Allergen Reactions  . Penicillins     Family History  Problem Relation Age of Onset  . Cancer Mother        Colon  . Stroke Father   . Breast cancer Neg Hx     Social History Social History   Tobacco Use  . Smoking status: Never Smoker  . Smokeless tobacco: Never Used  Substance Use Topics  . Alcohol use: No    Alcohol/week: 0.0 standard drinks    Comment: occasional wine with dinner  . Drug use: No    Review of Systems Unable to obtain adequate/accurate review of systems secondary to baseline dementia.  ____________________________________________   PHYSICAL EXAM:  VITAL SIGNS: ED Triage Vitals  Enc Vitals Group     BP 04/10/20 1421 100/85     Pulse Rate 04/10/20 1421 (!) 56     Resp 04/10/20 1421 16     Temp 04/10/20 1421 97.6 F (36.4 C)     Temp src --      SpO2 04/10/20 1421 99 %     Weight 04/10/20 1426 109 lb (49.4 kg)  Height 04/10/20 1426 5\' 2"  (1.575 m)     Head Circumference --      Peak Flow --      Pain Score 04/10/20 1426 0     Pain Loc --      Pain Edu? --      Excl. in [place]? --    Constitutional: Patient is somnolent but awakens to voice.  Will answer simple questions but with questionable accuracy.  History of dementia per EMS. Eyes: Normal exam ENT      Head: Normocephalic and atraumatic.      Mouth/Throat: Mucous membranes are moist. Cardiovascular: Normal rate, regular rhythm Respiratory: Normal respiratory effort without tachypnea nor retractions. Breath sounds are clear Gastrointestinal: Soft and nontender. No distention.  No reaction to abdominal palpation. Musculoskeletal: Nontender with normal range of motion  in all extremities. Neurologic: Normal speech and language.  Patient does appear somewhat slumped to the left.  Has good left-sided grip strength.  No obvious left-sided cranial nerve deficit. Skin:  Skin is warm, dry and intact.  Psychiatric: Mood and affect are normal.   ____________________________________________   RADIOLOGY  CT head shows worsening chronic changes but no acute abnormality. Chest x-ray is negative ____________________________________________   INITIAL IMPRESSION / ASSESSMENT AND PLAN / ED COURSE  Pertinent labs & imaging results that were available during my care of the patient were reviewed by me and considered in my medical decision making (see chart for details).   Patient presents to the emergency department for altered mental status.  Per EMS report patient has baseline dementia but is usually more interactive and less so today.  Patient is slumped to the left somewhat which per EMS report is new.  We will check labs, urinalysis, CT scan of the head.  We will IV hydrate while awaiting results.  Patient is somnolent but awakens to voice is calm cooperative, no distress, overall pleasant.  Imaging shows no acute abnormality.  Lab work is pending.  Patient care signed out to Dr. Jacqualine Code.  Carol Beard was evaluated in Emergency Department on 04/10/2020 for the symptoms described in the history of present illness. She was evaluated in the context of the global COVID-19 pandemic, which necessitated consideration that the patient might be at risk for infection with the SARS-CoV-2 virus that causes COVID-19. Institutional protocols and algorithms that pertain to the evaluation of patients at risk for COVID-19 are in a state of rapid change based on information released by regulatory bodies including the CDC and federal and state organizations. These policies and algorithms were followed during the patient's care in the  ED.  ____________________________________________   FINAL CLINICAL IMPRESSION(S) / ED DIAGNOSES  Altered mental status   Harvest Dark, MD 04/10/20 1523

## 2020-04-10 NOTE — Discharge Instructions (Signed)
I would recommend that you have doctors making housecalls visit with the patient and review examine as well as review medications for potential causes of drowsiness.  Her work-up today in the ER is reassuring and she seems to have improved here.

## 2020-04-10 NOTE — ED Provider Notes (Signed)
EKG reviewed inter by me at 1530 Heart rate 55 QRS 99 QTc 470, mild prolongation of QT interval. No evidence of acute ischemia   Delman Kitten, MD 04/10/20 1530

## 2020-04-10 NOTE — ED Provider Notes (Signed)
Discussed with the patient's brother, he is understanding of the plan to discharge her back to [facility].  He will also work with facility and doctors making housecalls for her follow-up.     Delman Kitten, MD 04/10/20 1930

## 2020-04-10 NOTE — ED Provider Notes (Signed)
Patient is alert, resting comfortably with normal vital signs.  Oriented to self and being at the hospital but not to year.  She appears in no distress, fully alert conversant at this time.  Lab work and work-up very reassuring.  Appears much improved.  Discussed with the patient, she is comfortable with plan to return to her care facility.   Delman Kitten, MD 04/10/20 1925

## 2020-04-11 ENCOUNTER — Telehealth: Payer: Self-pay | Admitting: Internal Medicine

## 2020-04-11 NOTE — Telephone Encounter (Signed)
LMTCB

## 2020-04-11 NOTE — Telephone Encounter (Signed)
Spoke with brother. He advised that doctors making house calls are coming out to see pt tomorrow. Advised that patient cannot have 2 PCPs so if doctors making house calls are coming out and managing pt, Dr Nicki Reaper will no longer be her PCP.

## 2020-04-11 NOTE — Telephone Encounter (Signed)
Pts brother would like a call regarding why his sister ended up in the ED yesterday? I let him know patient was not seen here yesterday and that she had a CT scan scheduled at [facility] but otherwise we wouldn't know what happened without looking at the patient's chart. I let him know that I will send the message to Dr. Nicki Reaper and have someone call him back.

## 2020-04-13 DIAGNOSIS — R404 Transient alteration of awareness: Secondary | ICD-10-CM | POA: Diagnosis not present

## 2020-05-04 DIAGNOSIS — Z79899 Other long term (current) drug therapy: Secondary | ICD-10-CM | POA: Diagnosis not present

## 2020-05-04 DIAGNOSIS — E782 Mixed hyperlipidemia: Secondary | ICD-10-CM | POA: Diagnosis not present

## 2020-05-09 DIAGNOSIS — B351 Tinea unguium: Secondary | ICD-10-CM | POA: Diagnosis not present

## 2020-05-09 DIAGNOSIS — L84 Corns and callosities: Secondary | ICD-10-CM | POA: Diagnosis not present

## 2020-05-09 DIAGNOSIS — I739 Peripheral vascular disease, unspecified: Secondary | ICD-10-CM | POA: Diagnosis not present

## 2020-05-15 DIAGNOSIS — E782 Mixed hyperlipidemia: Secondary | ICD-10-CM | POA: Diagnosis not present

## 2020-05-15 DIAGNOSIS — R4701 Aphasia: Secondary | ICD-10-CM | POA: Diagnosis not present

## 2020-05-15 DIAGNOSIS — D696 Thrombocytopenia, unspecified: Secondary | ICD-10-CM | POA: Diagnosis not present

## 2020-05-15 DIAGNOSIS — N189 Chronic kidney disease, unspecified: Secondary | ICD-10-CM | POA: Diagnosis not present

## 2020-05-15 DIAGNOSIS — R748 Abnormal levels of other serum enzymes: Secondary | ICD-10-CM | POA: Diagnosis not present

## 2020-06-01 DIAGNOSIS — D696 Thrombocytopenia, unspecified: Secondary | ICD-10-CM | POA: Diagnosis not present

## 2020-06-01 DIAGNOSIS — E782 Mixed hyperlipidemia: Secondary | ICD-10-CM | POA: Diagnosis not present

## 2020-06-01 DIAGNOSIS — R748 Abnormal levels of other serum enzymes: Secondary | ICD-10-CM | POA: Diagnosis not present

## 2020-06-08 NOTE — Telephone Encounter (Signed)
Noted  

## 2020-06-08 NOTE — Telephone Encounter (Signed)
Pts brother called and asked if we could remove Dr. Nicki Reaper as her pcp. Pt currently is seeing doctors that are making house calls. He said she is receiving excellent care. He cancelled AWV with Denisa for Friday also. I removed Dr. Nicki Reaper as pcp.

## 2020-06-09 ENCOUNTER — Ambulatory Visit: Payer: Medicare Other

## 2020-06-12 DIAGNOSIS — E041 Nontoxic single thyroid nodule: Secondary | ICD-10-CM | POA: Diagnosis not present

## 2020-06-12 DIAGNOSIS — R4701 Aphasia: Secondary | ICD-10-CM | POA: Diagnosis not present

## 2020-06-12 DIAGNOSIS — D696 Thrombocytopenia, unspecified: Secondary | ICD-10-CM | POA: Diagnosis not present

## 2020-06-12 DIAGNOSIS — E782 Mixed hyperlipidemia: Secondary | ICD-10-CM | POA: Diagnosis not present

## 2020-06-29 DIAGNOSIS — R451 Restlessness and agitation: Secondary | ICD-10-CM | POA: Diagnosis not present

## 2020-06-29 DIAGNOSIS — N189 Chronic kidney disease, unspecified: Secondary | ICD-10-CM | POA: Diagnosis not present

## 2020-06-29 DIAGNOSIS — R4701 Aphasia: Secondary | ICD-10-CM | POA: Diagnosis not present

## 2020-07-26 DIAGNOSIS — W19XXXA Unspecified fall, initial encounter: Secondary | ICD-10-CM | POA: Diagnosis not present

## 2020-07-26 DIAGNOSIS — R4701 Aphasia: Secondary | ICD-10-CM | POA: Diagnosis not present

## 2020-07-26 DIAGNOSIS — R233 Spontaneous ecchymoses: Secondary | ICD-10-CM | POA: Diagnosis not present

## 2020-07-26 DIAGNOSIS — R269 Unspecified abnormalities of gait and mobility: Secondary | ICD-10-CM | POA: Diagnosis not present

## 2020-07-28 DIAGNOSIS — G4700 Insomnia, unspecified: Secondary | ICD-10-CM | POA: Diagnosis not present

## 2020-08-15 DIAGNOSIS — N390 Urinary tract infection, site not specified: Secondary | ICD-10-CM | POA: Diagnosis not present

## 2020-08-15 DIAGNOSIS — R05 Cough: Secondary | ICD-10-CM | POA: Diagnosis not present

## 2020-08-15 DIAGNOSIS — J988 Other specified respiratory disorders: Secondary | ICD-10-CM | POA: Diagnosis not present

## 2020-08-15 DIAGNOSIS — R4701 Aphasia: Secondary | ICD-10-CM | POA: Diagnosis not present

## 2020-08-15 DIAGNOSIS — Z79899 Other long term (current) drug therapy: Secondary | ICD-10-CM | POA: Diagnosis not present

## 2020-08-16 DIAGNOSIS — R0989 Other specified symptoms and signs involving the circulatory and respiratory systems: Secondary | ICD-10-CM | POA: Diagnosis not present

## 2020-08-16 DIAGNOSIS — R05 Cough: Secondary | ICD-10-CM | POA: Diagnosis not present

## 2020-08-16 DIAGNOSIS — R531 Weakness: Secondary | ICD-10-CM | POA: Diagnosis not present

## 2020-08-16 DIAGNOSIS — J069 Acute upper respiratory infection, unspecified: Secondary | ICD-10-CM | POA: Diagnosis not present

## 2020-08-29 DIAGNOSIS — J988 Other specified respiratory disorders: Secondary | ICD-10-CM | POA: Diagnosis not present

## 2020-08-29 DIAGNOSIS — N390 Urinary tract infection, site not specified: Secondary | ICD-10-CM | POA: Diagnosis not present

## 2020-08-29 DIAGNOSIS — R05 Cough: Secondary | ICD-10-CM | POA: Diagnosis not present

## 2020-08-29 DIAGNOSIS — J069 Acute upper respiratory infection, unspecified: Secondary | ICD-10-CM | POA: Diagnosis not present

## 2020-10-03 DIAGNOSIS — E782 Mixed hyperlipidemia: Secondary | ICD-10-CM | POA: Diagnosis not present

## 2020-10-03 DIAGNOSIS — R634 Abnormal weight loss: Secondary | ICD-10-CM | POA: Diagnosis not present

## 2020-10-03 DIAGNOSIS — R4701 Aphasia: Secondary | ICD-10-CM | POA: Diagnosis not present

## 2020-10-03 DIAGNOSIS — E041 Nontoxic single thyroid nodule: Secondary | ICD-10-CM | POA: Diagnosis not present

## 2020-10-17 DIAGNOSIS — E782 Mixed hyperlipidemia: Secondary | ICD-10-CM | POA: Diagnosis not present

## 2020-10-17 DIAGNOSIS — E041 Nontoxic single thyroid nodule: Secondary | ICD-10-CM | POA: Diagnosis not present

## 2020-10-19 DIAGNOSIS — R2681 Unsteadiness on feet: Secondary | ICD-10-CM | POA: Diagnosis not present

## 2020-10-19 DIAGNOSIS — Z79899 Other long term (current) drug therapy: Secondary | ICD-10-CM | POA: Diagnosis not present

## 2020-10-19 DIAGNOSIS — R8290 Unspecified abnormal findings in urine: Secondary | ICD-10-CM | POA: Diagnosis not present

## 2020-10-20 DIAGNOSIS — N390 Urinary tract infection, site not specified: Secondary | ICD-10-CM | POA: Diagnosis not present

## 2020-10-20 DIAGNOSIS — Z79899 Other long term (current) drug therapy: Secondary | ICD-10-CM | POA: Diagnosis not present

## 2020-10-24 ENCOUNTER — Inpatient Hospital Stay
Admission: EM | Admit: 2020-10-24 | Discharge: 2020-10-31 | DRG: 057 | Disposition: A | Discharge status: Skilled Nursing Facility | Payer: Medicare Other | Attending: Internal Medicine | Admitting: Internal Medicine

## 2020-10-24 ENCOUNTER — Observation Stay: Payer: Medicare Other

## 2020-10-24 ENCOUNTER — Encounter: Payer: Self-pay | Admitting: Internal Medicine

## 2020-10-24 ENCOUNTER — Emergency Department: Payer: Medicare Other

## 2020-10-24 ENCOUNTER — Other Ambulatory Visit: Payer: Self-pay

## 2020-10-24 ENCOUNTER — Observation Stay
Admit: 2020-10-24 | Discharge: 2020-10-24 | Disposition: A | Discharge status: Home / Self Care | Payer: Medicare Other | Attending: Internal Medicine | Admitting: Internal Medicine

## 2020-10-24 DIAGNOSIS — Z681 Body mass index (BMI) 19 or less, adult: Secondary | ICD-10-CM | POA: Diagnosis not present

## 2020-10-24 DIAGNOSIS — F0390 Unspecified dementia without behavioral disturbance: Secondary | ICD-10-CM | POA: Diagnosis present

## 2020-10-24 DIAGNOSIS — I700 Atherosclerosis of aorta: Secondary | ICD-10-CM | POA: Diagnosis not present

## 2020-10-24 DIAGNOSIS — F0280 Dementia in other diseases classified elsewhere without behavioral disturbance: Secondary | ICD-10-CM | POA: Diagnosis present

## 2020-10-24 DIAGNOSIS — Z20822 Contact with and (suspected) exposure to covid-19: Secondary | ICD-10-CM | POA: Diagnosis present

## 2020-10-24 DIAGNOSIS — M810 Age-related osteoporosis without current pathological fracture: Secondary | ICD-10-CM | POA: Diagnosis present

## 2020-10-24 DIAGNOSIS — R4182 Altered mental status, unspecified: Secondary | ICD-10-CM | POA: Diagnosis not present

## 2020-10-24 DIAGNOSIS — Z7982 Long term (current) use of aspirin: Secondary | ICD-10-CM | POA: Diagnosis not present

## 2020-10-24 DIAGNOSIS — S199XXA Unspecified injury of neck, initial encounter: Secondary | ICD-10-CM | POA: Diagnosis not present

## 2020-10-24 DIAGNOSIS — Z79899 Other long term (current) drug therapy: Secondary | ICD-10-CM

## 2020-10-24 DIAGNOSIS — E041 Nontoxic single thyroid nodule: Secondary | ICD-10-CM | POA: Diagnosis not present

## 2020-10-24 DIAGNOSIS — R627 Adult failure to thrive: Secondary | ICD-10-CM | POA: Diagnosis not present

## 2020-10-24 DIAGNOSIS — I248 Other forms of acute ischemic heart disease: Secondary | ICD-10-CM | POA: Diagnosis not present

## 2020-10-24 DIAGNOSIS — G3101 Pick's disease: Principal | ICD-10-CM | POA: Diagnosis present

## 2020-10-24 DIAGNOSIS — Z880 Allergy status to penicillin: Secondary | ICD-10-CM

## 2020-10-24 DIAGNOSIS — S0990XA Unspecified injury of head, initial encounter: Secondary | ICD-10-CM | POA: Diagnosis not present

## 2020-10-24 DIAGNOSIS — R410 Disorientation, unspecified: Secondary | ICD-10-CM | POA: Diagnosis not present

## 2020-10-24 DIAGNOSIS — R404 Transient alteration of awareness: Secondary | ICD-10-CM | POA: Diagnosis not present

## 2020-10-24 DIAGNOSIS — R7989 Other specified abnormal findings of blood chemistry: Secondary | ICD-10-CM | POA: Diagnosis not present

## 2020-10-24 DIAGNOSIS — R531 Weakness: Secondary | ICD-10-CM | POA: Diagnosis not present

## 2020-10-24 DIAGNOSIS — G309 Alzheimer's disease, unspecified: Secondary | ICD-10-CM | POA: Diagnosis not present

## 2020-10-24 DIAGNOSIS — R4701 Aphasia: Secondary | ICD-10-CM | POA: Diagnosis not present

## 2020-10-24 DIAGNOSIS — I6529 Occlusion and stenosis of unspecified carotid artery: Secondary | ICD-10-CM | POA: Diagnosis not present

## 2020-10-24 DIAGNOSIS — R1312 Dysphagia, oropharyngeal phase: Secondary | ICD-10-CM | POA: Diagnosis present

## 2020-10-24 DIAGNOSIS — M79622 Pain in left upper arm: Secondary | ICD-10-CM | POA: Diagnosis not present

## 2020-10-24 DIAGNOSIS — Z823 Family history of stroke: Secondary | ICD-10-CM | POA: Diagnosis not present

## 2020-10-24 DIAGNOSIS — R9431 Abnormal electrocardiogram [ECG] [EKG]: Secondary | ICD-10-CM

## 2020-10-24 DIAGNOSIS — R5381 Other malaise: Secondary | ICD-10-CM | POA: Diagnosis not present

## 2020-10-24 DIAGNOSIS — T148XXA Other injury of unspecified body region, initial encounter: Secondary | ICD-10-CM | POA: Diagnosis not present

## 2020-10-24 DIAGNOSIS — Z800 Family history of malignant neoplasm of digestive organs: Secondary | ICD-10-CM

## 2020-10-24 DIAGNOSIS — R6251 Failure to thrive (child): Secondary | ICD-10-CM | POA: Diagnosis present

## 2020-10-24 DIAGNOSIS — G319 Degenerative disease of nervous system, unspecified: Secondary | ICD-10-CM | POA: Diagnosis not present

## 2020-10-24 LAB — DIFFERENTIAL
Abs Immature Granulocytes: 0.03 10*3/uL (ref 0.00–0.07)
Basophils Absolute: 0.1 10*3/uL (ref 0.0–0.1)
Basophils Relative: 1 %
Eosinophils Absolute: 0 10*3/uL (ref 0.0–0.5)
Eosinophils Relative: 0 %
Immature Granulocytes: 0 %
Lymphocytes Relative: 14 %
Lymphs Abs: 1.2 10*3/uL (ref 0.7–4.0)
Monocytes Absolute: 0.8 10*3/uL (ref 0.1–1.0)
Monocytes Relative: 10 %
Neutro Abs: 6.3 10*3/uL (ref 1.7–7.7)
Neutrophils Relative %: 75 %

## 2020-10-24 LAB — CBC
HCT: 46.7 % — ABNORMAL HIGH (ref 36.0–46.0)
Hemoglobin: 15.3 g/dL — ABNORMAL HIGH (ref 12.0–15.0)
MCH: 33.3 pg (ref 26.0–34.0)
MCHC: 32.8 g/dL (ref 30.0–36.0)
MCV: 101.5 fL — ABNORMAL HIGH (ref 80.0–100.0)
Platelets: 185 10*3/uL (ref 150–400)
RBC: 4.6 MIL/uL (ref 3.87–5.11)
RDW: 13.9 % (ref 11.5–15.5)
WBC: 8.4 10*3/uL (ref 4.0–10.5)
nRBC: 0 % (ref 0.0–0.2)

## 2020-10-24 LAB — COMPREHENSIVE METABOLIC PANEL
ALT: 31 U/L (ref 0–44)
AST: 47 U/L — ABNORMAL HIGH (ref 15–41)
Albumin: 4.2 g/dL (ref 3.5–5.0)
Alkaline Phosphatase: 79 U/L (ref 38–126)
Anion gap: 14 (ref 5–15)
BUN: 17 mg/dL (ref 8–23)
CO2: 26 mmol/L (ref 22–32)
Calcium: 9.3 mg/dL (ref 8.9–10.3)
Chloride: 99 mmol/L (ref 98–111)
Creatinine, Ser: 0.92 mg/dL (ref 0.44–1.00)
GFR, Estimated: >60 mL/min (ref >60)
Glucose, Bld: 105 mg/dL — ABNORMAL HIGH (ref 70–99)
Potassium: 4.4 mmol/L (ref 3.5–5.1)
Sodium: 139 mmol/L (ref 135–145)
Total Bilirubin: 0.9 mg/dL (ref 0.3–1.2)
Total Protein: 8.4 g/dL — ABNORMAL HIGH (ref 6.5–8.1)

## 2020-10-24 LAB — RESPIRATORY PANEL BY RT PCR (FLU A&B, COVID)
Influenza A by PCR: NEGATIVE
Influenza B by PCR: NEGATIVE
SARS Coronavirus 2 by RT PCR: NEGATIVE

## 2020-10-24 LAB — TSH: TSH: 2.835 u[IU]/mL (ref 0.350–4.500)

## 2020-10-24 LAB — TROPONIN I (HIGH SENSITIVITY)
Troponin I (High Sensitivity): 44 ng/L — ABNORMAL HIGH (ref <18)
Troponin I (High Sensitivity): 44 ng/L — ABNORMAL HIGH (ref <18)

## 2020-10-24 LAB — PROTIME-INR
INR: 1 (ref 0.8–1.2)
Prothrombin Time: 12.5 seconds (ref 11.4–15.2)

## 2020-10-24 LAB — APTT: aPTT: 32 seconds (ref 24–36)

## 2020-10-24 LAB — ETHANOL: Alcohol, Ethyl (B): <10 mg/dL (ref <10)

## 2020-10-24 MED ORDER — SODIUM CHLORIDE 0.9 % IV SOLN: INTRAVENOUS | Status: AC

## 2020-10-24 MED ORDER — ASPIRIN EC 81 MG PO TBEC
81.0000 mg | DELAYED_RELEASE_TABLET | Freq: Every day | ORAL | Status: DC
  Administered 2020-10-25 – 2020-10-31 (×7): 81 mg via ORAL
  Filled 2020-10-24 (×7): qty 1

## 2020-10-24 MED ORDER — DONEPEZIL HCL 5 MG PO TABS
10.0000 mg | ORAL_TABLET | Freq: Every day | ORAL | Status: DC
  Administered 2020-10-25 – 2020-10-30 (×6): 10 mg via ORAL
  Filled 2020-10-24 (×6): qty 2

## 2020-10-24 MED ORDER — ENOXAPARIN SODIUM 40 MG/0.4ML ~~LOC~~ SOLN: 40.0000 mg | SUBCUTANEOUS | Status: DC

## 2020-10-24 MED ORDER — ENOXAPARIN SODIUM 30 MG/0.3ML ~~LOC~~ SOLN
30.0000 mg | SUBCUTANEOUS | Status: DC
  Administered 2020-10-25 – 2020-10-30 (×6): 30 mg via SUBCUTANEOUS
  Filled 2020-10-24 (×6): qty 0.3

## 2020-10-24 MED ORDER — MIRTAZAPINE 15 MG PO TABS
7.5000 mg | ORAL_TABLET | Freq: Every day | ORAL | Status: DC
  Administered 2020-10-25 – 2020-10-30 (×6): 7.5 mg via ORAL
  Filled 2020-10-24 (×6): qty 1

## 2020-10-24 MED ORDER — ASPIRIN 300 MG RE SUPP
300.0000 mg | Freq: Once | RECTAL | Status: AC
  Administered 2020-10-24: 300 mg via RECTAL
  Filled 2020-10-24: qty 1

## 2020-10-24 MED ORDER — MEMANTINE HCL 5 MG PO TABS
10.0000 mg | ORAL_TABLET | Freq: Two times a day (BID) | ORAL | Status: DC
  Administered 2020-10-25 – 2020-10-31 (×13): 10 mg via ORAL
  Filled 2020-10-24 (×13): qty 2

## 2020-10-24 MED ORDER — LACTATED RINGERS IV BOLUS
1000.0000 mL | Freq: Once | INTRAVENOUS | Status: AC
  Administered 2020-10-24: 1000 mL via INTRAVENOUS

## 2020-10-24 NOTE — ED Notes (Signed)
Report given to Kathryn RN

## 2020-10-24 NOTE — ED Notes (Signed)
Patient's brief changed and peri care preformed.

## 2020-10-24 NOTE — ED Notes (Signed)
Floor unable to take report at this time due to nurse not being assigned by charge nurse.

## 2020-10-24 NOTE — H&P (Signed)
History and Physical   Carol Beard TOI:[id number] DOB: 09/26/1946 DOA: 10/24/2020  PCP: Patient, No Pcp Per (Confirm with patient/family/NH records and if not entered, this has to be entered at [facility] point of entry) Outpatient Specialists: Dr. Jennings Books at [facility] Patient coming from: Nursing facility/memory care facility  I have personally briefly reviewed patient's old medical records in [place].  Chief Concern: Mental status change  HPI: Carol Beard is a 74 y.o. female with medical history significant for single nontoxic thyroid nodule, dementia, presented to the emergency department from nursing home facility for complaints of altered mental status within the last week.  Per ED provider note, patient fell against the wall and hit her head against the wall about a week ago and then hit the floor.  They did not send her to the emergency department because she was okay afterward.  In the past week she was treated with antibiotics for presumed UTI.  Today when staff checked on her they found that she was too weak to get out of bed, not interactive, and very confused.  This is apparently different from her baseline as she is normally confused but able to participate and interact and ambulate.  I called and spoke with Carol Beard, and he states that over the course of the last three weeks, Carol Beard has noticed that she is not as talkative.  She is seemed more withdrawn and not herself over the last 3 weeks.  He endorses talking to her about once a week.  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  ED Course: Discussed with ED provider, admit patient for observation of mental status change.  Review of Systems:  As per HPI otherwise 10 point review of systems negative.  Assessment/Plan  Active Problems:   Change in mental status   Debility   Failure to thrive (child)   Dementia without behavioral disturbance ([place])   Change in mental status-work-up in process; suspect multifactorial including progression of dementia, failure to thrive and superimposed by chronic and worsening debility, versus vitamin deficiency versus possible infectious etiology versus thyroid -CT head without contrast was negative for evidence of acute intracranial abnormality, similar chronic microvascular ischemic disease and generalized atrophy -Checking chest x-ray, abdominal x-ray -Checking echo -Check TSH, vitamin D, vitamin B12/methylmalonic acid -Ethanol, UDS is pending -Troponin high-sensitivity first collection was positive at 45, we will continue to follow -SLP evaluation consulted to assess swallowing -N.p.o. at this time -I discussed with brother, Carol Beard, and he states that he wanted her to be full code and that she would want to live. -Would recommend a.m. provider to consult palliative for discussion with father regarding goals of care and expectations for patient. -Normal saline at 100 cc/h for 1 day  Elevated troponin without clear ischemic EKG changes -Continue to trend troponin -Echo ordered -If troponins sensitivity has positive delta, would recommend a.m. 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 pending  speech evaluation Family Communication: Discussed with brother, Carol Beard Disposition Plan: Pending clinical  course Consults called: None at this time Admission status: Observation with telemetry  Past Medical History:  Diagnosis Date  . Aphasia   . Environmental allergies   . Thyroid nodule    biopsy x 2 negative   Past Surgical History:  Procedure Laterality Date  . BREAST EXCISIONAL BIOPSY Left 1971   fibroadenoma, left breast  . TONSILLECTOMY     Social History:  reports that she has never smoked. She has never used smokeless tobacco. She reports that she does not drink alcohol and does not use drugs.  Allergies  Allergen Reactions  . Penicillins    Family History  Problem Relation Age of Onset  . Cancer Mother        Colon  . Stroke Father   . Breast cancer Neg Hx    Family history: Family history reviewed and cancer in mother and stroke in father.  Prior to Admission medications   Medication Sig Start Date End Date Taking? Authorizing Provider  aspirin EC 81 MG tablet Take 81 mg by mouth daily.     [provider]  donepezil (ARICEPT) 10 MG tablet Take 1 tablet by mouth daily. 12/30/16   [provider]  memantine (NAMENDA) 10 MG tablet Take 10 mg by mouth 2 (two) times daily. 09/01/19 08/31/20  [provider]  mirtazapine (REMERON) 15 MG tablet Take 0.5 tablets (7.5 mg total) by mouth at bedtime. 06/09/19   Jodelle Green, FNP  Polyvinyl Alcohol-Povidone (REFRESH OP) Apply to eye.    [provider]   Physical Exam: Vitals:   10/24/20 1630 10/24/20 1729 10/24/20 1813 10/24/20 1941  BP: (!) 142/73 (!) 135/99  (!) 151/76  Pulse:  76  73  Resp: 11 17  15   Temp:  98.3 F (36.8 C)  98.2 F (36.8 C)  TempSrc:  Oral  Oral  SpO2: 100% 100%  99%  Weight:   44.1 kg   Height:   5\' 2"  (1.575 m)    Constitutional: appears age-appropriate, frail, NAD, calm, comfortable Eyes: PERRL, lids and conjunctivae normal ENMT: Mucous membranes are moist. Posterior pharynx clear of any exudate or lesions. Age-appropriate dentition. Hearing  appropriate Neck: normal, supple, no masses, no thyromegaly Respiratory: clear to auscultation bilaterally, no wheezing, no crackles. Normal respiratory effort. No accessory muscle use.  Cardiovascular: Regular rate and rhythm, no murmurs / rubs / gallops. No extremity edema. 2+ pedal pulses. No carotid bruits.  Abdomen: no tenderness, no masses palpated, no hepatosplenomegaly. Bowel sounds positive.  Musculoskeletal: no clubbing / cyanosis. No joint deformity upper and lower extremities. Good ROM, no contractures, no atrophy. Normal muscle tone.  Skin: no rashes, lesions, ulcers. No induration Neurologic:  Sensation intact. Strength 4/5 in all 4.  Follows commands with opening mouth, opening eyes, squeezing eyes, squeezing my fingers with both hands Psychiatric: Patient is minimally verbal, unable to assess psychiatric mood.  She does appear to have a flat affect  EKG: Independently reviewed, showing sinus rhythm showing rate of 61, QTc 486, LVH  Chest x-ray: Personally reviewed and I agree with radiologist reading as below.  DG Abd 1 View  Result Date: 10/24/2020 CLINICAL DATA:  Confusion and weakness. EXAM: ABDOMEN - 1 VIEW COMPARISON:  11/26/2018 FINDINGS: A moderate amount of stool is noted in the left colon. No dilated loops of bowel are seen to suggest obstruction. Aortic atherosclerosis is noted. No acute osseous abnormality is identified.  IMPRESSION: Nonobstructed bowel gas pattern. Electronically Signed   By: Logan Bores M.D.   On: 10/24/2020 13:52   CT HEAD WO CONTRAST  Result Date: 10/24/2020 CLINICAL DATA:  Altered mental status with trauma. EXAM: CT HEAD WITHOUT CONTRAST CT CERVICAL SPINE WITHOUT CONTRAST TECHNIQUE: Multidetector CT imaging of the head and cervical spine was performed following the standard protocol without intravenous contrast. Multiplanar CT image reconstructions of the cervical spine were also generated. COMPARISON:  None. CT head April 10, 2020. FINDINGS: CT  HEAD FINDINGS Brain: No evidence of acute large vascular territory infarction, hemorrhage, hydrocephalus, extra-axial collection or mass lesion/mass effect. Similar patchy white matter hypoattenuation, most likely related to chronic microvascular ischemic disease. Similar diffuse cerebral atrophy with ex vacuo ventricular dilation. Vascular: Calcific atherosclerosis. Skull: No acute fracture. Sinuses/Orbits: No acute findings. Other: No mastoid effusions. CT CERVICAL SPINE FINDINGS Alignment: Broad levocurvature.  No substantial subluxation. Skull base and vertebrae: No acute fracture. Vertebral body heights are maintained. No primary bone lesion or focal pathologic process. Soft tissues and spinal canal: No prevertebral fluid or swelling. No visible canal hematoma. Disc levels: Mild multilevel facet degenerative change. No significant bony canal stenosis. Upper chest: Negative. Other: Calcific atherosclerosis of the carotids. IMPRESSION: CT head: 1. No evidence of acute intracranial abnormality. 2. Similar chronic microvascular ischemic disease and generalized atrophy. CT cervical spine: 1. No evidence of acute fracture or traumatic malalignment. Electronically Signed   By: Margaretha Sheffield MD   On: 10/24/2020 11:23   CT CERVICAL SPINE WO CONTRAST  Result Date: 10/24/2020 CLINICAL DATA:  Altered mental status with trauma. EXAM: CT HEAD WITHOUT CONTRAST CT CERVICAL SPINE WITHOUT CONTRAST TECHNIQUE: Multidetector CT imaging of the head and cervical spine was performed following the standard protocol without intravenous contrast. Multiplanar CT image reconstructions of the cervical spine were also generated. COMPARISON:  None. CT head April 10, 2020. FINDINGS: CT HEAD FINDINGS Brain: No evidence of acute large vascular territory infarction, hemorrhage, hydrocephalus, extra-axial collection or mass lesion/mass effect. Similar patchy white matter hypoattenuation, most likely related to chronic microvascular  ischemic disease. Similar diffuse cerebral atrophy with ex vacuo ventricular dilation. Vascular: Calcific atherosclerosis. Skull: No acute fracture. Sinuses/Orbits: No acute findings. Other: No mastoid effusions. CT CERVICAL SPINE FINDINGS Alignment: Broad levocurvature.  No substantial subluxation. Skull base and vertebrae: No acute fracture. Vertebral body heights are maintained. No primary bone lesion or focal pathologic process. Soft tissues and spinal canal: No prevertebral fluid or swelling. No visible canal hematoma. Disc levels: Mild multilevel facet degenerative change. No significant bony canal stenosis. Upper chest: Negative. Other: Calcific atherosclerosis of the carotids. IMPRESSION: CT head: 1. No evidence of acute intracranial abnormality. 2. Similar chronic microvascular ischemic disease and generalized atrophy. CT cervical spine: 1. No evidence of acute fracture or traumatic malalignment. Electronically Signed   By: Margaretha Sheffield MD   On: 10/24/2020 11:23   DG Chest Port 1 View  Result Date: 10/24/2020 CLINICAL DATA:  Mental status change.  Fall 1 week ago. EXAM: PORTABLE CHEST 1 VIEW COMPARISON:  04/10/2020 FINDINGS: Cardiac enlargement without heart failure. Lungs clear without infiltrate or effusion. Mild apical scarring bilaterally. IMPRESSION: No active disease. Electronically Signed   By: Franchot Gallo M.D.   On: 10/24/2020 13:51   DG Humerus Left  Result Date: 10/24/2020 CLINICAL DATA:  Fall.  Left upper arm pain. EXAM: LEFT HUMERUS - 2+ VIEW COMPARISON:  None. FINDINGS: There is no evidence of fracture or other focal bone lesions. Soft tissues are unremarkable. IMPRESSION:  Negative. Electronically Signed   By: Margaretha Sheffield MD   On: 10/24/2020 11:29    Labs on Admission: I have personally reviewed following labs  CBC: Recent Labs  Lab 10/24/20 1045  WBC 8.4  NEUTROABS 6.3  HGB 15.3*  HCT 46.7*  MCV 101.5*  PLT 403   Basic Metabolic Panel: Recent Labs  Lab  10/24/20 1045  NA 139  K 4.4  CL 99  CO2 26  GLUCOSE 105*  BUN 17  CREATININE 0.92  CALCIUM 9.3   GFR: Estimated Creatinine Clearance: 37.9 mL/min (by C-G formula based on SCr of 0.92 mg/dL). Liver Function Tests: Recent Labs  Lab 10/24/20 1045  AST 47*  ALT 31  ALKPHOS 79  BILITOT 0.9  PROT 8.4*  ALBUMIN 4.2   Coagulation Profile: Recent Labs  Lab 10/24/20 1801  INR 1.0   Thyroid Function Tests: Recent Labs    10/24/20 1801  TSH 2.835   Anemia Panel:  No results for input(s): VITAMINB12, FOLATE, FERRITIN, TIBC, IRON, RETICCTPCT in the last 72 hours. Urine analysis:    Component Value Date/Time   COLORURINE YELLOW (A) 04/10/2020 1711   APPEARANCEUR CLEAR (A) 04/10/2020 1711   LABSPEC 1.025 04/10/2020 1711   PHURINE 5.0 04/10/2020 1711   GLUCOSEU NEGATIVE 04/10/2020 1711   GLUCOSEU NEGATIVE 06/23/2013 0939   HGBUR NEGATIVE 04/10/2020 1711   BILIRUBINUR NEGATIVE 04/10/2020 1711   BILIRUBINUR neg 09/07/2013 1046   KETONESUR 5 (A) 04/10/2020 1711   PROTEINUR 100 (A) 04/10/2020 1711   UROBILINOGEN 0.2 09/07/2013 1046   UROBILINOGEN 1.0 06/23/2013 0939   NITRITE NEGATIVE 04/10/2020 1711   LEUKOCYTESUR NEGATIVE 04/10/2020 1711   Quinlynn Cuthbert N Deveon Kisiel D.O. [facility]  If 12AM-7AM, please contact overnight-coverage provider If 7AM-7PM, please contact day coverage provider [email or web address]  10/24/2020, 8:39 PM

## 2020-10-24 NOTE — Progress Notes (Signed)
PHARMACIST - PHYSICIAN COMMUNICATION  CONCERNING:  Enoxaparin (Lovenox) for DVT Prophylaxis    RECOMMENDATION: Patient was prescribed enoxaprin 40mg  q24 hours for VTE prophylaxis.   Filed Weights   10/24/20 1813  Weight: 44.1 kg (97 lb 3.6 oz)    Body mass index is 17.78 kg/m.  Estimated Creatinine Clearance: 37.9 mL/min (by C-G formula based on SCr of 0.92 mg/dL).   Patient is candidate for enoxaparin 30mg  every 24 hours based on CrCl <21ml/min or Weight <45kg  DESCRIPTION: Pharmacy has adjusted enoxaparin dose per [facility] policy.  Patient is now receiving enoxaparin 30 mg every 24 hours    Berta Minor, PharmD Clinical Pharmacist  10/24/2020 6:21 PM

## 2020-10-24 NOTE — ED Provider Notes (Signed)
[facility] Emergency Department Provider Note  ____________________________________________  Time seen: Approximately 12:42 PM  I have reviewed the triage vital signs and the nursing notes.   HISTORY  Chief Complaint Altered Mental Status    Level 5 Caveat: Portions of the History and Physical including HPI and review of systems are unable to be completely obtained due to patient being a poor historian   HPI Carol Beard is a 74 y.o. female with a history of chronic dementia who was sent to the ED from her memory care today due to altered mental status.  Reportedly she had a fall about a week ago where she fell against the wall and then hit the floor.  She seemed to be okay afterward, and in the past week has been treated with antibiotics for a presumed UTI.  Today when staff checked on her they found that she was too weak to get out of bed, not interactive, and very confused.  This is different from her baseline where she is normally confused but able to interact and ambulate.      Past Medical History:  Diagnosis Date  . Aphasia   . Environmental allergies   . Thyroid nodule    biopsy x 2 negative     Patient Active Problem List   Diagnosis Date Noted  . Change in mental status 10/24/2020  . Anxiety 06/13/2019  . Osteoporosis 09/11/2018  . 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 Breast nodule 12/24/2015  . Headache 12/24/2015  . Health care maintenance 06/18/2015  . Environmental allergies 06/18/2015  . Toenail fungus 07/31/2014  . Weight loss 03/28/2014  . History of colonic polyps 10/28/2013  . Knee pain 09/09/2013  . Skin lesion 09/09/2013  . Memory change 06/18/2013  . Hand numbness 06/18/2013  . Fullness of breast 06/16/2013  . Thyroid nodule 01/13/2013     Past Surgical History:  Procedure Laterality Date  . BREAST EXCISIONAL BIOPSY Left 1971   fibroadenoma, left breast  . TONSILLECTOMY       Prior to  Admission medications   Medication Sig Start Date End Date Taking? Authorizing Provider  aspirin EC 81 MG tablet Take 81 mg by mouth daily.     [provider]  donepezil (ARICEPT) 10 MG tablet Take 1 tablet by mouth daily. 12/30/16   [provider]  memantine (NAMENDA) 10 MG tablet Take 10 mg by mouth 2 (two) times daily. 09/01/19 08/31/20  [provider]  mirtazapine (REMERON) 15 MG tablet Take 0.5 tablets (7.5 mg total) by mouth at bedtime. 06/09/19   Jodelle Green, FNP  Polyvinyl Alcohol-Povidone (REFRESH OP) Apply to eye.    [provider]     Allergies Penicillins   Family History  Problem Relation Age of Onset  . Cancer Mother        Colon  . Stroke Father   . Breast cancer Neg Hx     Social History Social History   Tobacco Use  . Smoking status: Never Smoker  . Smokeless tobacco: Never Used  Vaping Use  . Vaping Use: Never used  Substance Use Topics  . Alcohol use: No    Alcohol/week: 0.0 standard drinks    Comment: occasional wine with dinner  . Drug use: No    Review of Systems Level 5 Caveat: Portions of the History and Physical including HPI and review of systems are unable to be completely obtained due to patient being a poor historian   Constitutional:  No known fever.  ENT:   No rhinorrhea. Cardiovascular:   No chest pain or syncope. Respiratory:   No dyspnea or cough. Gastrointestinal:   Negative for abdominal pain, vomiting and diarrhea.  Musculoskeletal:   Negative for focal pain or swelling ____________________________________________   PHYSICAL EXAM:  VITAL SIGNS: ED Triage Vitals [10/24/20 1028]  Enc Vitals Group     BP (!) 142/85     Pulse Rate 61     Resp 16     Temp 97.6 F (36.4 C)     Temp Source Axillary     SpO2 98 %     Weight      Height      Head Circumference      Peak Flow      Pain Score      Pain Loc      Pain Edu?      Excl. in [place]?     Vital signs reviewed, nursing  assessments reviewed.   Constitutional:   Awake, not alert, not oriented.  Chronically ill-appearing. Eyes:   Conjunctivae are normal. EOMI. PERRL. ENT      Head:   Normocephalic with chronic tissue swelling over the left supraorbital ridge and left upper eyelid      Nose:   No congestion/rhinnorhea.       Mouth/Throat:   Dry mucous membranes, no pharyngeal erythema. No peritonsillar mass.       Neck:   No meningismus. Full ROM.  No step-off or midline tenderness or crepitus Hematological/Lymphatic/Immunilogical:   No cervical lymphadenopathy. Cardiovascular:   RRR. Symmetric bilateral radial and DP pulses.  No murmurs. Cap refill less than 2 seconds. Respiratory:   Normal respiratory effort without tachypnea/retractions. Breath sounds are clear and equal bilaterally. No wheezes/rales/rhonchi. Gastrointestinal:   Soft and nontender. Non distended. There is no CVA tenderness.  No rebound, rigidity, or guarding.  Musculoskeletal:   Normal range of motion in all extremities. No joint effusions.  No lower extremity tenderness.  No edema.  There is a small amount of bruising over the lateral deltoid at the left humeral neck with tenderness in this area.  No deformity. Neurologic:   Aphasic. Attempts to follow commands but has difficulty with follow-through including opening her eyes all the way. Unable to participate in exam, no identifiable focal lesions.  skin:    Skin is warm, dry and intact. No rash noted.  No petechiae, purpura, or bullae.  ____________________________________________    LABS (pertinent positives/negatives) (all labs ordered are listed, but only abnormal results are displayed) Labs Reviewed  CBC - Abnormal; Notable for the following components:      Result Value   Hemoglobin 15.3 (*)    HCT 46.7 (*)    MCV 101.5 (*)    All other components within normal limits  COMPREHENSIVE METABOLIC PANEL - Abnormal; Notable for the following components:   Glucose, Bld 105 (*)     Total Protein 8.4 (*)    AST 47 (*)    All other components within normal limits  TROPONIN I (HIGH SENSITIVITY) - Abnormal; Notable for the following components:   Troponin I (High Sensitivity) 44 (*)    All other components within normal limits  RESPIRATORY PANEL BY RT PCR (FLU A&B, COVID)  DIFFERENTIAL  URINE DRUG SCREEN, QUALITATIVE ([facility] ONLY)  URINALYSIS, ROUTINE W REFLEX MICROSCOPIC  ETHANOL  PROTIME-INR  APTT  TROPONIN I (HIGH SENSITIVITY)   ____________________________________________   EKG  Interpreted by me Sinus rhythm rate of  61, normal axis and intervals.  Normal QRS and ST segments.  There is T wave inversion in V2 and V3 which are new compared to previous EKG on April 10, 2020.  ____________________________________________    RADIOLOGY  CT HEAD WO CONTRAST  Result Date: 10/24/2020 CLINICAL DATA:  Altered mental status with trauma. EXAM: CT HEAD WITHOUT CONTRAST CT CERVICAL SPINE WITHOUT CONTRAST TECHNIQUE: Multidetector CT imaging of the head and cervical spine was performed following the standard protocol without intravenous contrast. Multiplanar CT image reconstructions of the cervical spine were also generated. COMPARISON:  None. CT head April 10, 2020. FINDINGS: CT HEAD FINDINGS Brain: No evidence of acute large vascular territory infarction, hemorrhage, hydrocephalus, extra-axial collection or mass lesion/mass effect. Similar patchy white matter hypoattenuation, most likely related to chronic microvascular ischemic disease. Similar diffuse cerebral atrophy with ex vacuo ventricular dilation. Vascular: Calcific atherosclerosis. Skull: No acute fracture. Sinuses/Orbits: No acute findings. Other: No mastoid effusions. CT CERVICAL SPINE FINDINGS Alignment: Broad levocurvature.  No substantial subluxation. Skull base and vertebrae: No acute fracture. Vertebral body heights are maintained. No primary bone lesion or focal pathologic process. Soft tissues and spinal  canal: No prevertebral fluid or swelling. No visible canal hematoma. Disc levels: Mild multilevel facet degenerative change. No significant bony canal stenosis. Upper chest: Negative. Other: Calcific atherosclerosis of the carotids. IMPRESSION: CT head: 1. No evidence of acute intracranial abnormality. 2. Similar chronic microvascular ischemic disease and generalized atrophy. CT cervical spine: 1. No evidence of acute fracture or traumatic malalignment. Electronically Signed   By: Margaretha Sheffield MD   On: 10/24/2020 11:23   CT CERVICAL SPINE WO CONTRAST  Result Date: 10/24/2020 CLINICAL DATA:  Altered mental status with trauma. EXAM: CT HEAD WITHOUT CONTRAST CT CERVICAL SPINE WITHOUT CONTRAST TECHNIQUE: Multidetector CT imaging of the head and cervical spine was performed following the standard protocol without intravenous contrast. Multiplanar CT image reconstructions of the cervical spine were also generated. COMPARISON:  None. CT head April 10, 2020. FINDINGS: CT HEAD FINDINGS Brain: No evidence of acute large vascular territory infarction, hemorrhage, hydrocephalus, extra-axial collection or mass lesion/mass effect. Similar patchy white matter hypoattenuation, most likely related to chronic microvascular ischemic disease. Similar diffuse cerebral atrophy with ex vacuo ventricular dilation. Vascular: Calcific atherosclerosis. Skull: No acute fracture. Sinuses/Orbits: No acute findings. Other: No mastoid effusions. CT CERVICAL SPINE FINDINGS Alignment: Broad levocurvature.  No substantial subluxation. Skull base and vertebrae: No acute fracture. Vertebral body heights are maintained. No primary bone lesion or focal pathologic process. Soft tissues and spinal canal: No prevertebral fluid or swelling. No visible canal hematoma. Disc levels: Mild multilevel facet degenerative change. No significant bony canal stenosis. Upper chest: Negative. Other: Calcific atherosclerosis of the carotids. IMPRESSION: CT head:  1. No evidence of acute intracranial abnormality. 2. Similar chronic microvascular ischemic disease and generalized atrophy. CT cervical spine: 1. No evidence of acute fracture or traumatic malalignment. Electronically Signed   By: Margaretha Sheffield MD   On: 10/24/2020 11:23   DG Humerus Left  Result Date: 10/24/2020 CLINICAL DATA:  Fall.  Left upper arm pain. EXAM: LEFT HUMERUS - 2+ VIEW COMPARISON:  None. FINDINGS: There is no evidence of fracture or other focal bone lesions. Soft tissues are unremarkable. IMPRESSION: Negative. Electronically Signed   By: Margaretha Sheffield MD   On: 10/24/2020 11:29    ____________________________________________   PROCEDURES Procedures  ____________________________________________  DIFFERENTIAL DIAGNOSIS   Non-STEMI, dehydration, electrolyte abnormality, intracranial hemorrhage, C-spine fracture, proximal left humerus fracture  CLINICAL IMPRESSION /  ASSESSMENT AND PLAN / ED COURSE  Medications ordered in the ED: Medications  aspirin suppository 300 mg (has no administration in time range)  lactated ringers bolus 1,000 mL (has no administration in time range)    Pertinent labs & imaging results that were available during my care of the patient were reviewed by me and considered in my medical decision making (see chart for details).   DEVRI KREHER was evaluated in Emergency Department on 10/24/2020 for the symptoms described in the history of present illness. She was evaluated in the context of the global COVID-19 pandemic, which necessitated consideration that the patient might be at risk for infection with the SARS-CoV-2 virus that causes COVID-19. Institutional protocols and algorithms that pertain to the evaluation of patients at risk for COVID-19 are in a state of rapid change based on information released by regulatory bodies including the CDC and federal and state organizations. These policies and algorithms were followed during the patient's  care in the ED.     Clinical Course as of Oct 25 1247  Tue Oct 24, 2020  1042 Patient presents from memory care with decreased level of consciousness, not ambulatory or responsive now.  Has evidence of left upper arm blunt trauma.  Will obtain x-ray as well as CT head and neck to evaluate for ICH or stroke, check labs.  Care discussed with patient's POA, brother.   [PS]    Clinical Course User Index [PS] Carrie Mew, MD     ----------------------------------------- 12:47 PM on 10/24/2020 -----------------------------------------  Work-up shows a troponin of 44, increased from normal value a few months ago.  With her EKG changes and altered mental status, I will hospitalize for further evaluation.  Other lab results are reassuring, CT head and neck are negative for serious injury, x-ray of the left humerus is negative.  ____________________________________________   FINAL CLINICAL IMPRESSION(S) / ED DIAGNOSES    Final diagnoses:  Altered mental status, unspecified altered mental status type  Chronic dementia without behavioral disturbance ([place])  Abnormal EKG     ED Discharge Orders    None      Portions of this note were generated with dragon dictation software. Dictation errors may occur despite best attempts at proofreading.   Carrie Mew, MD 10/24/20 1248

## 2020-10-24 NOTE — ED Triage Notes (Signed)
Per EMS pt altered x 1 week. Placed on ABX for UTI, urine came back negative. Reports worsening AMS today, EMS reports normally walking, today won't walk.

## 2020-10-24 NOTE — ED Notes (Signed)
Patient transported to CT 

## 2020-10-24 NOTE — ED Notes (Signed)
Pt to CT

## 2020-10-25 DIAGNOSIS — Z20822 Contact with and (suspected) exposure to covid-19: Secondary | ICD-10-CM | POA: Diagnosis present

## 2020-10-25 DIAGNOSIS — Z79899 Other long term (current) drug therapy: Secondary | ICD-10-CM | POA: Diagnosis not present

## 2020-10-25 DIAGNOSIS — Z681 Body mass index (BMI) 19 or less, adult: Secondary | ICD-10-CM | POA: Diagnosis not present

## 2020-10-25 DIAGNOSIS — F0390 Unspecified dementia without behavioral disturbance: Secondary | ICD-10-CM | POA: Diagnosis not present

## 2020-10-25 DIAGNOSIS — M2548 Effusion, other site: Secondary | ICD-10-CM | POA: Diagnosis not present

## 2020-10-25 DIAGNOSIS — R410 Disorientation, unspecified: Secondary | ICD-10-CM | POA: Diagnosis not present

## 2020-10-25 DIAGNOSIS — R5381 Other malaise: Secondary | ICD-10-CM | POA: Diagnosis not present

## 2020-10-25 DIAGNOSIS — G3101 Pick's disease: Secondary | ICD-10-CM | POA: Diagnosis present

## 2020-10-25 DIAGNOSIS — Z880 Allergy status to penicillin: Secondary | ICD-10-CM | POA: Diagnosis not present

## 2020-10-25 DIAGNOSIS — Z8673 Personal history of transient ischemic attack (TIA), and cerebral infarction without residual deficits: Secondary | ICD-10-CM | POA: Diagnosis not present

## 2020-10-25 DIAGNOSIS — Z7982 Long term (current) use of aspirin: Secondary | ICD-10-CM | POA: Diagnosis not present

## 2020-10-25 DIAGNOSIS — Z823 Family history of stroke: Secondary | ICD-10-CM | POA: Diagnosis not present

## 2020-10-25 DIAGNOSIS — H748X2 Other specified disorders of left middle ear and mastoid: Secondary | ICD-10-CM | POA: Diagnosis not present

## 2020-10-25 DIAGNOSIS — R1312 Dysphagia, oropharyngeal phase: Secondary | ICD-10-CM | POA: Diagnosis not present

## 2020-10-25 DIAGNOSIS — M810 Age-related osteoporosis without current pathological fracture: Secondary | ICD-10-CM | POA: Diagnosis present

## 2020-10-25 DIAGNOSIS — R279 Unspecified lack of coordination: Secondary | ICD-10-CM | POA: Diagnosis not present

## 2020-10-25 DIAGNOSIS — M6281 Muscle weakness (generalized): Secondary | ICD-10-CM | POA: Diagnosis not present

## 2020-10-25 DIAGNOSIS — R627 Adult failure to thrive: Secondary | ICD-10-CM | POA: Diagnosis present

## 2020-10-25 DIAGNOSIS — F0280 Dementia in other diseases classified elsewhere without behavioral disturbance: Secondary | ICD-10-CM | POA: Diagnosis present

## 2020-10-25 DIAGNOSIS — R4701 Aphasia: Secondary | ICD-10-CM | POA: Diagnosis not present

## 2020-10-25 DIAGNOSIS — Z800 Family history of malignant neoplasm of digestive organs: Secondary | ICD-10-CM | POA: Diagnosis not present

## 2020-10-25 DIAGNOSIS — E041 Nontoxic single thyroid nodule: Secondary | ICD-10-CM | POA: Diagnosis not present

## 2020-10-25 DIAGNOSIS — R4182 Altered mental status, unspecified: Secondary | ICD-10-CM | POA: Diagnosis present

## 2020-10-25 DIAGNOSIS — I248 Other forms of acute ischemic heart disease: Secondary | ICD-10-CM | POA: Diagnosis not present

## 2020-10-25 DIAGNOSIS — R29818 Other symptoms and signs involving the nervous system: Secondary | ICD-10-CM | POA: Diagnosis not present

## 2020-10-25 LAB — URINALYSIS, ROUTINE W REFLEX MICROSCOPIC
Bilirubin Urine: NEGATIVE
Glucose, UA: NEGATIVE mg/dL
Hgb urine dipstick: NEGATIVE
Ketones, ur: 5 mg/dL — AB
Leukocytes,Ua: NEGATIVE
Nitrite: NEGATIVE
Protein, ur: NEGATIVE mg/dL
Specific Gravity, Urine: 1.013 (ref 1.005–1.030)
pH: 7 (ref 5.0–8.0)

## 2020-10-25 LAB — CBC
HCT: 37.8 % (ref 36.0–46.0)
Hemoglobin: 12.8 g/dL (ref 12.0–15.0)
MCH: 33.7 pg (ref 26.0–34.0)
MCHC: 33.9 g/dL (ref 30.0–36.0)
MCV: 99.5 fL (ref 80.0–100.0)
Platelets: 181 10*3/uL (ref 150–400)
RBC: 3.8 MIL/uL — ABNORMAL LOW (ref 3.87–5.11)
RDW: 13.8 % (ref 11.5–15.5)
WBC: 7 10*3/uL (ref 4.0–10.5)
nRBC: 0 % (ref 0.0–0.2)

## 2020-10-25 LAB — URINE DRUG SCREEN, QUALITATIVE (ARMC ONLY)
Amphetamines, Ur Screen: NOT DETECTED
Barbiturates, Ur Screen: NOT DETECTED
Benzodiazepine, Ur Scrn: NOT DETECTED
Cannabinoid 50 Ng, Ur ~~LOC~~: NOT DETECTED
Cocaine Metabolite,Ur ~~LOC~~: NOT DETECTED
MDMA (Ecstasy)Ur Screen: NOT DETECTED
Methadone Scn, Ur: NOT DETECTED
Opiate, Ur Screen: NOT DETECTED
Phencyclidine (PCP) Ur S: NOT DETECTED
Tricyclic, Ur Screen: NOT DETECTED

## 2020-10-25 LAB — BASIC METABOLIC PANEL
Anion gap: 11 (ref 5–15)
BUN: 13 mg/dL (ref 8–23)
CO2: 24 mmol/L (ref 22–32)
Calcium: 8.7 mg/dL — ABNORMAL LOW (ref 8.9–10.3)
Chloride: 106 mmol/L (ref 98–111)
Creatinine, Ser: 0.68 mg/dL (ref 0.44–1.00)
GFR, Estimated: >60 mL/min (ref >60)
Glucose, Bld: 95 mg/dL (ref 70–99)
Potassium: 3.9 mmol/L (ref 3.5–5.1)
Sodium: 141 mmol/L (ref 135–145)

## 2020-10-25 LAB — ECHOCARDIOGRAM COMPLETE
Area-P 1/2: 2.17 cm2
Height: 62 in
S' Lateral: 2.04 cm
Weight: 1555.57 oz

## 2020-10-25 LAB — VITAMIN B12: Vitamin B-12: 283 pg/mL (ref 180–914)

## 2020-10-25 NOTE — Plan of Care (Signed)
  Problem: Clinical Measurements: ?Goal: Ability to maintain clinical measurements within normal limits will improve ?Outcome: Progressing ?Goal: Respiratory complications will improve ?Outcome: Progressing ?Goal: Cardiovascular complication will be avoided ?Outcome: Progressing ?  ?Problem: Education: ?Goal: Knowledge of General Education information will improve ?Description: Including pain rating scale, medication(s)/side effects and non-pharmacologic comfort measures ?Outcome: Not Progressing ?  ?Problem: Health Behavior/Discharge Planning: ?Goal: Ability to manage health-related needs will improve ?Outcome: Not Progressing ?  ?

## 2020-10-25 NOTE — Evaluation (Signed)
Clinical/Bedside Swallow Evaluation Patient Details  Name: Carol Beard MRN: [id number] Date of Birth: 1946/11/01  Today's Date: 10/25/2020 Time: SLP Start Time (ACUTE ONLY): 0845 SLP Stop Time (ACUTE ONLY): 0928 SLP Time Calculation (min) (ACUTE ONLY): 43 min  Past Medical History:  Past Medical History:  Diagnosis Date  . Aphasia   . Environmental allergies   . Thyroid nodule    biopsy x 2 negative   Past Surgical History:  Past Surgical History:  Procedure Laterality Date  . BREAST EXCISIONAL BIOPSY Left 1971   fibroadenoma, left breast  . TONSILLECTOMY     HPI:  Carol Beard is a 74 y.o. female with medical history significant for single nontoxic thyroid nodule, dementia, presented to the emergency department from nursing home facility for complaints of altered mental status within the last week. Pt recently fell at her memory care facility. CT head and chest are negative for any acute abnormalities.    Assessment / Plan / Recommendation Clinical Impression  Pt presents with mild oropharyngeal dysphagia that is likely related to her AMS. Pt's responses (cognitive and physical) are slow and very reduced. Her ability to follow 1 step basic directions is also very impaired. When pt is holding the cup, she is not able to suck thru a straw but when SLP is presenting the straw, pt is able to suck thru the straw. Her mastication of puree and soft solids was slow and reduced. Despite this, she was able to effectively consume dysphagia 2 with thin liquids via a straw without any overt s/s of oropharyngeal dysphagia or aspiration. Given pt's current AMS and inability to follow directions or use compensatory swallow strategies, recommend conservative diet of dysphagia 2 with thin liquids, medicine crushed in puree. ST will follow up for diet toleration and possible diet advancement. Secure chat sent to pt's attending requesting an order for cognitive linguistic evaluation given the  severity of pt's cognitive and physical confusion.  SLP Visit Diagnosis: Dysphagia, unspecified (R13.10)    Aspiration Risk  Mild aspiration risk    Diet Recommendation Dysphagia 2 (Fine chop);Thin liquid   Liquid Administration via: Straw Medication Administration: Crushed with puree Supervision: Staff to assist with self feeding;Full supervision/cueing for compensatory strategies (Total assist for feeding) Compensations: Minimize environmental distractions;Slow rate;Small sips/bites Postural Changes: Seated upright at 90 degrees    Other  Recommendations Oral Care Recommendations: Oral care BID   Follow up Recommendations Skilled Nursing facility      Frequency and Duration min 2x/week  2 weeks       Prognosis Prognosis for Safe Diet Advancement: Fair Barriers to Reach Goals: Cognitive deficits      Swallow Study   General Date of Onset: 10/24/20 HPI: Carol Beard is a 74 y.o. female with medical history significant for single nontoxic thyroid nodule, dementia, presented to the emergency department from nursing home facility for complaints of altered mental status within the last week. Pt recently fell at her memory care facility. CT head and chest are negative for any acute abnormalities.  Type of Study: Bedside Swallow Evaluation Previous Swallow Assessment: none in chart Diet Prior to this Study: NPO Temperature Spikes Noted: No Respiratory Status: Room air History of Recent Intubation: No Behavior/Cognition: Alert;Cooperative;Pleasant mood;Confused;Doesn't follow directions Oral Cavity Assessment: Within Functional Limits Oral Care Completed by SLP: Recent completion by staff Oral Cavity - Dentition: Adequate natural dentition Self-Feeding Abilities: Total assist Patient Positioning: Upright in bed Baseline Vocal Quality: Low vocal intensity Volitional Cough: Cognitively unable to  elicit Volitional Swallow: Unable to elicit    Oral/Motor/Sensory Function  Overall Oral Motor/Sensory Function: Within functional limits   Ice Chips Ice chips: Not tested   Thin Liquid Thin Liquid: Within functional limits Presentation: Straw    Nectar Thick Nectar Thick Liquid: Not tested   Honey Thick Honey Thick Liquid: Not tested   Puree Puree: Within functional limits Presentation: Spoon   Solid     Solid: Within functional limits Other Comments: dysphagia 2 textures     Cathi Hazan B. Rutherford Nail M.S., CCC-SLP, [place] [facility]ologist [facility] [place] 10/25/2020,3:54 PM

## 2020-10-25 NOTE — Progress Notes (Signed)
PROGRESS NOTE    Carol Beard  EXH:[id number] DOB: Aug 17, 1946 DOA: 10/24/2020 PCP: Patient, No Pcp Per    Brief Narrative:  Carol Beard is a 74 y.o. female with medical history significant for single nontoxic thyroid nodule, dementia, presented to the emergency department from nursing home facility for complaints of altered mental status within the last week. Per ED provider note, patient fell against the wall and hit her head against the wall about a week ago and then hit the floor.  They did not send her to the emergency department because she was okay afterward.  In the past week she was treated with antibiotics for presumed UTI.  on admission day, when staff checked on her they found that she was too weak to get out of bed, not interactive, and very confused.  This is apparently different from her baseline as she is normally confused but able to participate and interact and ambulate.  11/10-per nursing pt mostly nonverbal.  Consultants:     Procedures: CT head  Antimicrobials:       Subjective: Pt quiet,, nonverbal . Tries to nod when I ask her a question.   Objective: Vitals:   10/25/20 0737 10/25/20 1115 10/25/20 1523 10/25/20 1526  BP: 138/71 125/64 134/65 127/65  Pulse: 72 73 74 75  Resp: 16 17 17 17   Temp: 97.7 F (36.5 C) 98.1 F (36.7 C) 98.6 F (37 C) 98.6 F (37 C)  TempSrc: Oral     SpO2: 98% 100% 100% 99%  Weight:      Height:        Intake/Output Summary (Last 24 hours) at 10/25/2020 1653 Last data filed at 10/25/2020 1536 Gross per 24 hour  Intake 2064.69 ml  Output 800 ml  Net 1264.69 ml   Filed Weights   10/24/20 1813  Weight: 44.1 kg    Examination:  General exam: Appears calm and comfortable , quiet, not interactive Respiratory system: Clear to auscultation. Respiratory effort normal. Cardiovascular system: S1 & S2 heard, RRR. No JVD, murmurs, rubs, gallops or clicks.  Gastrointestinal system: Abdomen is nondistended, soft  and nontender.  Normal bowel sounds heard. Central nervous system:awake, unable to assess Extremities: no edema Skin: Warm dry Psychiatry: Unable to assess    Data Reviewed: I have personally reviewed following labs and imaging studies  CBC: Recent Labs  Lab 10/24/20 1045 10/25/20 0546  WBC 8.4 7.0  NEUTROABS 6.3  --   HGB 15.3* 12.8  HCT 46.7* 37.8  MCV 101.5* 99.5  PLT 185 381   Basic Metabolic Panel: Recent Labs  Lab 10/24/20 1045 10/25/20 0546  NA 139 141  K 4.4 3.9  CL 99 106  CO2 26 24  GLUCOSE 105* 95  BUN 17 13  CREATININE 0.92 0.68  CALCIUM 9.3 8.7*   GFR: Estimated Creatinine Clearance: 43.6 mL/min (by C-G formula based on SCr of 0.68 mg/dL). Liver Function Tests: Recent Labs  Lab 10/24/20 1045  AST 47*  ALT 31  ALKPHOS 79  BILITOT 0.9  PROT 8.4*  ALBUMIN 4.2   No results for input(s): LIPASE, AMYLASE in the last 168 hours. No results for input(s): AMMONIA in the last 168 hours. Coagulation Profile: Recent Labs  Lab 10/24/20 1801  INR 1.0   Cardiac Enzymes: No results for input(s): CKTOTAL, CKMB, CKMBINDEX, TROPONINI in the last 168 hours. BNP (last 3 results) No results for input(s): PROBNP in the last 8760 hours. HbA1C: No results for input(s): HGBA1C in the last 72  hours. CBG: No results for input(s): GLUCAP in the last 168 hours. Lipid Profile: No results for input(s): CHOL, HDL, LDLCALC, TRIG, CHOLHDL, LDLDIRECT in the last 72 hours. Thyroid Function Tests: Recent Labs    10/24/20 1801  TSH 2.835   Anemia Panel: Recent Labs    10/24/20 1801  VITAMINB12 283   Sepsis Labs: No results for input(s): PROCALCITON, LATICACIDVEN in the last 168 hours.  Recent Results (from the past 240 hour(s))  Respiratory Panel by RT PCR (Flu A&B, Covid) - Nasopharyngeal Swab     Status: None   Collection Time: 10/24/20 11:23 AM   Specimen: Nasopharyngeal Swab  Result Value Ref Range Status   SARS Coronavirus 2 by RT PCR NEGATIVE NEGATIVE  Final    Comment: (NOTE) SARS-CoV-2 target nucleic acids are NOT DETECTED.  The SARS-CoV-2 RNA is generally detectable in upper respiratoy specimens during the acute phase of infection. The lowest concentration of SARS-CoV-2 viral copies this assay can detect is 131 copies/mL. A negative result does not preclude SARS-Cov-2 infection and should not be used as the sole basis for treatment or other patient management decisions. A negative result may occur with  improper specimen collection/handling, submission of specimen other than nasopharyngeal swab, presence of viral mutation(s) within the areas targeted by this assay, and inadequate number of viral copies (<131 copies/mL). A negative result must be combined with clinical observations, patient history, and epidemiological information. The expected result is Negative.  Fact Sheet for Patients:  PinkCheek.be  Fact Sheet for Healthcare Providers:  GravelBags.it  This test is no t yet approved or cleared by the [place] FDA and  has been authorized for detection and/or diagnosis of SARS-CoV-2 by FDA under an Emergency Use Authorization (EUA). This EUA will remain  in effect (meaning this test can be used) for the duration of the COVID-19 declaration under Section 564(b)(1) of the Act, 21 U.S.C. section 360bbb-3(b)(1), unless the authorization is terminated or revoked sooner.     Influenza A by PCR NEGATIVE NEGATIVE Final   Influenza B by PCR NEGATIVE NEGATIVE Final    Comment: (NOTE) The Xpert Xpress SARS-CoV-2/FLU/RSV assay is intended as an aid in  the diagnosis of influenza from Nasopharyngeal swab specimens and  should not be used as a sole basis for treatment. Nasal washings and  aspirates are unacceptable for Xpert Xpress SARS-CoV-2/FLU/RSV  testing.  Fact Sheet for Patients: PinkCheek.be  Fact Sheet for Healthcare  Providers: GravelBags.it  This test is not yet approved or cleared by the [place] FDA and  has been authorized for detection and/or diagnosis of SARS-CoV-2 by  FDA under an Emergency Use Authorization (EUA). This EUA will remain  in effect (meaning this test can be used) for the duration of the  Covid-19 declaration under Section 564(b)(1) of the Act, 21  U.S.C. section 360bbb-3(b)(1), unless the authorization is  terminated or revoked. Performed at [facility], [address], [place],  [address]          Radiology Studies: DG Abd 1 View  Result Date: 10/24/2020 CLINICAL DATA:  Confusion and weakness. EXAM: ABDOMEN - 1 VIEW COMPARISON:  11/26/2018 FINDINGS: A moderate amount of stool is noted in the left colon. No dilated loops of bowel are seen to suggest obstruction. Aortic atherosclerosis is noted. No acute osseous abnormality is identified. IMPRESSION: Nonobstructed bowel gas pattern. Electronically Signed   By: Logan Bores M.D.   On: 10/24/2020 13:52   CT HEAD WO CONTRAST  Result Date: 10/24/2020  CLINICAL DATA:  Altered mental status with trauma. EXAM: CT HEAD WITHOUT CONTRAST CT CERVICAL SPINE WITHOUT CONTRAST TECHNIQUE: Multidetector CT imaging of the head and cervical spine was performed following the standard protocol without intravenous contrast. Multiplanar CT image reconstructions of the cervical spine were also generated. COMPARISON:  None. CT head April 10, 2020. FINDINGS: CT HEAD FINDINGS Brain: No evidence of acute large vascular territory infarction, hemorrhage, hydrocephalus, extra-axial collection or mass lesion/mass effect. Similar patchy white matter hypoattenuation, most likely related to chronic microvascular ischemic disease. Similar diffuse cerebral atrophy with ex vacuo ventricular dilation. Vascular: Calcific atherosclerosis. Skull: No acute fracture. Sinuses/Orbits: No acute findings. Other: No mastoid  effusions. CT CERVICAL SPINE FINDINGS Alignment: Broad levocurvature.  No substantial subluxation. Skull base and vertebrae: No acute fracture. Vertebral body heights are maintained. No primary bone lesion or focal pathologic process. Soft tissues and spinal canal: No prevertebral fluid or swelling. No visible canal hematoma. Disc levels: Mild multilevel facet degenerative change. No significant bony canal stenosis. Upper chest: Negative. Other: Calcific atherosclerosis of the carotids. IMPRESSION: CT head: 1. No evidence of acute intracranial abnormality. 2. Similar chronic microvascular ischemic disease and generalized atrophy. CT cervical spine: 1. No evidence of acute fracture or traumatic malalignment. Electronically Signed   By: Margaretha Sheffield MD   On: 10/24/2020 11:23   CT CERVICAL SPINE WO CONTRAST  Result Date: 10/24/2020 CLINICAL DATA:  Altered mental status with trauma. EXAM: CT HEAD WITHOUT CONTRAST CT CERVICAL SPINE WITHOUT CONTRAST TECHNIQUE: Multidetector CT imaging of the head and cervical spine was performed following the standard protocol without intravenous contrast. Multiplanar CT image reconstructions of the cervical spine were also generated. COMPARISON:  None. CT head April 10, 2020. FINDINGS: CT HEAD FINDINGS Brain: No evidence of acute large vascular territory infarction, hemorrhage, hydrocephalus, extra-axial collection or mass lesion/mass effect. Similar patchy white matter hypoattenuation, most likely related to chronic microvascular ischemic disease. Similar diffuse cerebral atrophy with ex vacuo ventricular dilation. Vascular: Calcific atherosclerosis. Skull: No acute fracture. Sinuses/Orbits: No acute findings. Other: No mastoid effusions. CT CERVICAL SPINE FINDINGS Alignment: Broad levocurvature.  No substantial subluxation. Skull base and vertebrae: No acute fracture. Vertebral body heights are maintained. No primary bone lesion or focal pathologic process. Soft tissues and  spinal canal: No prevertebral fluid or swelling. No visible canal hematoma. Disc levels: Mild multilevel facet degenerative change. No significant bony canal stenosis. Upper chest: Negative. Other: Calcific atherosclerosis of the carotids. IMPRESSION: CT head: 1. No evidence of acute intracranial abnormality. 2. Similar chronic microvascular ischemic disease and generalized atrophy. CT cervical spine: 1. No evidence of acute fracture or traumatic malalignment. Electronically Signed   By: Margaretha Sheffield MD   On: 10/24/2020 11:23   DG Chest Port 1 View  Result Date: 10/24/2020 CLINICAL DATA:  Mental status change.  Fall 1 week ago. EXAM: PORTABLE CHEST 1 VIEW COMPARISON:  04/10/2020 FINDINGS: Cardiac enlargement without heart failure. Lungs clear without infiltrate or effusion. Mild apical scarring bilaterally. IMPRESSION: No active disease. Electronically Signed   By: Franchot Gallo M.D.   On: 10/24/2020 13:51   DG Humerus Left  Result Date: 10/24/2020 CLINICAL DATA:  Fall.  Left upper arm pain. EXAM: LEFT HUMERUS - 2+ VIEW COMPARISON:  None. FINDINGS: There is no evidence of fracture or other focal bone lesions. Soft tissues are unremarkable. IMPRESSION: Negative. Electronically Signed   By: Margaretha Sheffield MD   On: 10/24/2020 11:29   ECHOCARDIOGRAM COMPLETE  Result Date: 10/25/2020    ECHOCARDIOGRAM REPORT  Patient Name:   KRYSIA ZAHRADNIK Date of Exam: 10/24/2020 Medical Rec #:  [id number]         Height:       62.0 in Accession #:    [id number]        Weight:       97.2 lb Date of Birth:  [birth year]         BSA:          1.407 m Patient Age:    50 years          BP:           151/76 mmHg Patient Gender: F                 HR:           70 bpm. Exam Location:  [facility] Procedure: 2D Echo, Cardiac Doppler and Color Doppler Indications:     Elevated troponin  History:         Patient has prior history of Echocardiogram examinations, most                  recent 07/06/2013.  Sonographer:     Wilford Sports  Rodgers-Jones Referring Phys:  [id number] AMY N COX Diagnosing Phys: Bartholome Bill MD IMPRESSIONS  1. Left ventricular ejection fraction, by estimation, is 65 to 70%. The left ventricle has normal function. The left ventricle has no regional wall motion abnormalities. There is moderate left ventricular hypertrophy. Left ventricular diastolic parameters are consistent with Grade I diastolic dysfunction (impaired relaxation).  2. Right ventricular systolic function is normal. The right ventricular size is normal.  3. The mitral valve is grossly normal. Trivial mitral valve regurgitation.  4. The aortic valve is grossly normal. Aortic valve regurgitation is trivial. FINDINGS  Left Ventricle: Left ventricular ejection fraction, by estimation, is 65 to 70%. The left ventricle has normal function. The left ventricle has no regional wall motion abnormalities. The left ventricular internal cavity size was normal in size. There is  moderate left ventricular hypertrophy. Left ventricular diastolic parameters are consistent with Grade I diastolic dysfunction (impaired relaxation). Right Ventricle: The right ventricular size is normal. No increase in right ventricular wall thickness. Right ventricular systolic function is normal. Left Atrium: Left atrial size was normal in size. Right Atrium: Right atrial size was normal in size. Pericardium: There is no evidence of pericardial effusion. Mitral Valve: The mitral valve is grossly normal. Trivial mitral valve regurgitation. Tricuspid Valve: The tricuspid valve is grossly normal. Tricuspid valve regurgitation is mild. Aortic Valve: The aortic valve is grossly normal. Aortic valve regurgitation is trivial. Pulmonic Valve: The pulmonic valve was not well visualized. Pulmonic valve regurgitation is trivial. Aorta: The aortic root is normal in size and structure. IAS/Shunts: The interatrial septum was not assessed.  LEFT VENTRICLE PLAX 2D LVIDd:         3.39 cm Diastology LVIDs:          2.04 cm LV e' medial:    5.77 cm/s LV PW:         0.80 cm LV E/e' medial:  11.0 LV IVS:        0.62 cm LV e' lateral:   7.40 cm/s                        LV E/e' lateral: 8.6  RIGHT VENTRICLE             IVC RV Basal diam:  3.53 cm  IVC diam: 0.99 cm RV S prime:     20.00 cm/s TAPSE (M-mode): 2.2 cm LEFT ATRIUM             Index       RIGHT ATRIUM          Index LA diam:        2.50 cm 1.78 cm/m  RA Area:     7.05 cm LA Vol (A2C):   16.9 ml 12.01 ml/m RA Volume:   12.50 ml 8.88 ml/m LA Vol (A4C):   18.0 ml 12.79 ml/m LA Biplane Vol: 17.7 ml 12.58 ml/m   AORTA Ao Root diam: 3.30 cm MITRAL VALVE               TRICUSPID VALVE MV Area (PHT): 2.17 cm    TR Peak grad:   22.7 mmHg MV Decel Time: 349 msec    TR Vmax:        238.00 cm/s MV E velocity: 63.40 cm/s MV A velocity: 88.30 cm/s MV E/A ratio:  0.72 Bartholome Bill MD Electronically signed by Bartholome Bill MD Signature Date/Time: 10/25/2020/7:38:10 AM    Final         Scheduled Meds: . aspirin EC  81 mg Oral Daily  . donepezil  10 mg Oral QHS  . enoxaparin (LOVENOX) injection  30 mg Subcutaneous Q24H  . memantine  10 mg Oral BID  . mirtazapine  7.5 mg Oral QHS   Continuous Infusions: . sodium chloride 100 mL/hr at 10/25/20 1644    Assessment & Plan:   Active Problems:   Change in mental status   Debility   Failure to thrive (child)   Dementia without behavioral disturbance ([place])   1.Altered MS-Change in mental status-suspect multifactorial including progression of dementia, failure to thrive and superimposed by chronic and worsening debility, versus vitamin deficiency versus possible infectious etiology versus thyroid -Need to r/o reversible causes -CT head without contrast was negative for evidence of acute intracranial abnormality, similar chronic microvascular ischemic disease and generalized atrophy 11/10-vitamin B12 normal levels, TSH normal, UA negative With EF 65 to 70%, normal wall motion.  LVH, grade 1 diastolic  dysfunction , Influenza negative Toxicology negative, alcohol less than 10 Still not at baseline.  Will consult neurology. May need MRI, but will defer to neurology Consult palliative care for goals of care  Continue with ivf SPL- found with mild oropharyngeal dysphagia likely related to her AMS, mild  aspiration risk.  Placed on dysphagia 2 diet with thin liquids please see note Also will ask nsg to ck I/O's    2.Elevated troponin - ekg non ischemic Echo nml EF without reginal WMA. Likely 2/2 demand ischmia.   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 outpt management    DVT prophylaxis: Lovenox Code Status: Full Family Communication: none at bedside  Status is: inpatient  The patient will require care spanning > 2 midnights and should be moved to inpatient because: Altered mental status  Dispo: The patient is from: SNF              Anticipated d/c is to: SNF              Anticipated d/c date is: 2 days              Patient currently is not medically stable to d/c.Still not at baseline. Neurology consulted.  Palliative consulted            LOS: 0 days   Time spent: 35 minutes with more than 50% on [place], MD [facility] Pager [phone]  If 7PM-7AM, please contact night-coverage [email or web address] Password [email or web address] 10/25/2020, 4:53 PM

## 2020-10-25 NOTE — Plan of Care (Signed)

## 2020-10-25 NOTE — Progress Notes (Signed)
Pt remains NPO, unable to follow commands. No complaints of pain.

## 2020-10-25 NOTE — TOC Initial Note (Signed)
Transition of Care [facility]) - Initial/Assessment Note    Patient Details  Name: Carol Beard MRN: [id number] Date of Birth: 09-Jul-1946  Transition of Care [facility]) CM/SW Contact:    Shelbie Ammons, RN Phone Number: 10/25/2020, 11:20 AM  Clinical Narrative:   Due to patient's non-verbal status RNCM reached out to patient's brother, Carol Beard for assessment.  [name] reports patient is a resident of the memory care unit at [facility] and she has been there for a while now. [name] reports that patient is normally verbally responsive. He reports that it is there wish for patient to return to [facility] when she is ready. RNCM will follow and facilitate discharge and transfer when appropriate.           Expected Discharge Plan: Memory Care Barriers to Discharge: Continued Medical Work up   Patient Goals and CMS Choice        Expected Discharge Plan and Services Expected Discharge Plan: Memory Care       Living arrangements for the past 2 months: [place] (Memory Care)                                      Prior Living Arrangements/Services Living arrangements for the past 2 months: [place] (Memory Care) Lives with:: Facility Resident Patient language and need for interpreter reviewed:: Yes        Need for Family Participation in Patient Care: No (Comment) Care giver support system in place?: Yes (comment)   Criminal Activity/Legal Involvement Pertinent to Current Situation/Hospitalization: No - Comment as needed  Activities of Daily Living Home Assistive Devices/Equipment: Wheelchair, Environmental consultant (specify type) ADL Screening (condition at time of admission) Patient's cognitive ability adequate to safely complete daily activities?: No Is the patient deaf or have difficulty hearing?: No Does the patient have difficulty seeing, even when wearing glasses/contacts?: No Does the patient have difficulty concentrating, remembering, or making  decisions?: Yes Patient able to express need for assistance with ADLs?: No Does the patient have difficulty dressing or bathing?: Yes Independently performs ADLs?: No Does the patient have difficulty walking or climbing stairs?: Yes Weakness of Legs: Both Weakness of Arms/Hands: None  Permission Sought/Granted                  Emotional Assessment         Alcohol / Substance Use: Not Applicable Psych Involvement: No (comment)  Admission diagnosis:  Abnormal EKG [R94.31] Change in mental status [R41.82] Chronic dementia without behavioral disturbance ([place]) [F03.90] Altered mental status, unspecified altered mental status type [R41.82] Patient Active Problem List   Diagnosis Date Noted   Change in mental status 10/24/2020   Debility 10/24/2020   Failure to thrive (child) 10/24/2020   Dementia without behavioral disturbance ([place]) 10/24/2020   Anxiety 06/13/2019   Osteoporosis 09/11/2018   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[place]) 07/19/2017   Breast nodule 12/24/2015   Headache 12/24/2015   Health care maintenance 06/18/2015   Environmental allergies 06/18/2015   Toenail fungus 07/31/2014   Weight loss 03/28/2014   History of colonic polyps 10/28/2013   Knee pain 09/09/2013   Skin lesion 09/09/2013   Memory change 06/18/2013   Hand numbness 06/18/2013   Fullness of breast 06/16/2013   Thyroid nodule 01/13/2013   PCP:  Patient, No Pcp Per Pharmacy:   [id number] - [place], [place] - [address]  [place] [address] Phone: [phone] Fax: [phone]     Social Determinants of Health (SDOH) Interventions    Readmission Risk Interventions No flowsheet data found.

## 2020-10-26 ENCOUNTER — Inpatient Hospital Stay: Payer: Medicare Other

## 2020-10-26 DIAGNOSIS — R4182 Altered mental status, unspecified: Secondary | ICD-10-CM | POA: Diagnosis not present

## 2020-10-26 DIAGNOSIS — R410 Disorientation, unspecified: Secondary | ICD-10-CM | POA: Diagnosis not present

## 2020-10-26 LAB — AMMONIA: Ammonia: 25 umol/L (ref 9–35)

## 2020-10-26 NOTE — Progress Notes (Signed)
eeg done °

## 2020-10-26 NOTE — Plan of Care (Signed)
Consult request noted. Patient is out of room at time of attempted visit. Will revisit at another time.

## 2020-10-26 NOTE — Plan of Care (Signed)
Patient returned to room. She is unable to answer questions including her name. No family at bedside. Awaiting results of MRI. Attempted to call brother unsuccessfully. Will reattempt at another time.

## 2020-10-26 NOTE — Progress Notes (Signed)
PROGRESS NOTE    Carol Beard  YIF:[id number] DOB: June 03, 1946 DOA: 10/24/2020 PCP: Patient, No Pcp Per    Brief Narrative:  Carol Beard is a 74 y.o. female with medical history significant for single nontoxic thyroid nodule, dementia, presented to the emergency department from nursing home facility for complaints of altered mental status within the last week. Per ED provider note, patient fell against the wall and hit her head against the wall about a week ago and then hit the floor.  They did not send her to the emergency department because she was okay afterward.  In the past week she was treated with antibiotics for presumed UTI.  on admission day, when staff checked on her they found that she was too weak to get out of bed, not interactive, and very confused.  This is apparently different from her baseline as she is normally confused but able to participate and interact and ambulate.  11/11-minimal communication . Pt reports yes /no to some questions  Consultants:   Neurology  Procedures: CT head  Antimicrobials:       Subjective: Quiet, minimal improvment  Objective: Vitals:   10/25/20 2305 10/26/20 0433 10/26/20 0749 10/26/20 1145  BP: 137/63 (!) 156/83 135/71 133/84  Pulse: 73 80 71 70  Resp: 14 15 16 17   Temp: 97.8 F (36.6 C) 98.6 F (37 C) 97.8 F (36.6 C) 98.4 F (36.9 C)  TempSrc: Oral  Oral Oral  SpO2: 96% 97% 96% 99%  Weight:      Height:        Intake/Output Summary (Last 24 hours) at 10/26/2020 1534 Last data filed at 10/26/2020 0650 Gross per 24 hour  Intake 1158.47 ml  Output 1400 ml  Net -241.53 ml   Filed Weights   10/24/20 1813  Weight: 44.1 kg    Examination: Quiet, calm cta no w/r/r RRR s1/s2 Soft benign, +bs No edema Unable to assess neurologic exam   Data Reviewed: I have personally reviewed following labs and imaging studies  CBC: Recent Labs  Lab 10/24/20 1045 10/25/20 0546  WBC 8.4 7.0  NEUTROABS 6.3  --    HGB 15.3* 12.8  HCT 46.7* 37.8  MCV 101.5* 99.5  PLT 185 867   Basic Metabolic Panel: Recent Labs  Lab 10/24/20 1045 10/25/20 0546  NA 139 141  K 4.4 3.9  CL 99 106  CO2 26 24  GLUCOSE 105* 95  BUN 17 13  CREATININE 0.92 0.68  CALCIUM 9.3 8.7*   GFR: Estimated Creatinine Clearance: 43.6 mL/min (by C-G formula based on SCr of 0.68 mg/dL). Liver Function Tests: Recent Labs  Lab 10/24/20 1045  AST 47*  ALT 31  ALKPHOS 79  BILITOT 0.9  PROT 8.4*  ALBUMIN 4.2   No results for input(s): LIPASE, AMYLASE in the last 168 hours. Recent Labs  Lab 10/26/20 1401  AMMONIA 25   Coagulation Profile: Recent Labs  Lab 10/24/20 1801  INR 1.0   Cardiac Enzymes: No results for input(s): CKTOTAL, CKMB, CKMBINDEX, TROPONINI in the last 168 hours. BNP (last 3 results) No results for input(s): PROBNP in the last 8760 hours. HbA1C: No results for input(s): HGBA1C in the last 72 hours. CBG: No results for input(s): GLUCAP in the last 168 hours. Lipid Profile: No results for input(s): CHOL, HDL, LDLCALC, TRIG, CHOLHDL, LDLDIRECT in the last 72 hours. Thyroid Function Tests: Recent Labs    10/24/20 1801  TSH 2.835   Anemia Panel: Recent Labs  10/24/20 1801  VITAMINB12 283   Sepsis Labs: No results for input(s): PROCALCITON, LATICACIDVEN in the last 168 hours.  Recent Results (from the past 240 hour(s))  Respiratory Panel by RT PCR (Flu A&B, Covid) - Nasopharyngeal Swab     Status: None   Collection Time: 10/24/20 11:23 AM   Specimen: Nasopharyngeal Swab  Result Value Ref Range Status   SARS Coronavirus 2 by RT PCR NEGATIVE NEGATIVE Final    Comment: (NOTE) SARS-CoV-2 target nucleic acids are NOT DETECTED.  The SARS-CoV-2 RNA is generally detectable in upper respiratoy specimens during the acute phase of infection. The lowest concentration of SARS-CoV-2 viral copies this assay can detect is 131 copies/mL. A negative result does not preclude SARS-Cov-2 infection  and should not be used as the sole basis for treatment or other patient management decisions. A negative result may occur with  improper specimen collection/handling, submission of specimen other than nasopharyngeal swab, presence of viral mutation(s) within the areas targeted by this assay, and inadequate number of viral copies (<131 copies/mL). A negative result must be combined with clinical observations, patient history, and epidemiological information. The expected result is Negative.  Fact Sheet for Patients:  PinkCheek.be  Fact Sheet for Healthcare Providers:  GravelBags.it  This test is no t yet approved or cleared by the [place] FDA and  has been authorized for detection and/or diagnosis of SARS-CoV-2 by FDA under an Emergency Use Authorization (EUA). This EUA will remain  in effect (meaning this test can be used) for the duration of the COVID-19 declaration under Section 564(b)(1) of the Act, 21 U.S.C. section 360bbb-3(b)(1), unless the authorization is terminated or revoked sooner.     Influenza A by PCR NEGATIVE NEGATIVE Final   Influenza B by PCR NEGATIVE NEGATIVE Final    Comment: (NOTE) The Xpert Xpress SARS-CoV-2/FLU/RSV assay is intended as an aid in  the diagnosis of influenza from Nasopharyngeal swab specimens and  should not be used as a sole basis for treatment. Nasal washings and  aspirates are unacceptable for Xpert Xpress SARS-CoV-2/FLU/RSV  testing.  Fact Sheet for Patients: PinkCheek.be  Fact Sheet for Healthcare Providers: GravelBags.it  This test is not yet approved or cleared by the [place] FDA and  has been authorized for detection and/or diagnosis of SARS-CoV-2 by  FDA under an Emergency Use Authorization (EUA). This EUA will remain  in effect (meaning this test can be used) for the duration of the  Covid-19 declaration  under Section 564(b)(1) of the Act, 21  U.S.C. section 360bbb-3(b)(1), unless the authorization is  terminated or revoked. Performed at [facility], [address] [place], [place] [address]          Radiology Studies: EEG  Result Date: 10/26/2020 Lora Havens, MD     10/26/2020  2:16 PM Patient Name: Carol Beard MRN: [id number] Epilepsy Attending: Lora Havens Referring Physician/Provider: Dr. Kathrynn Speed Date: 10/26/2020 Duration: 32.58 minutes Patient history: 74 year old female with history of dementia and aphasia who presented with worsening mental status. EEG to evaluate for seizures. Level of alertness: Awake AEDs during EEG study: None Technical aspects: This EEG study was done with scalp electrodes positioned according to the 10-20 International system of electrode placement. Electrical activity was acquired at a sampling rate of 500Hz  and reviewed with a high frequency filter of 70Hz  and a low frequency filter of 1Hz . EEG data were recorded continuously and digitally stored. Description: No posterior dominant rhythm was seen. EEG showed continuous generalized  3 to 6 Hz theta-delta slowing. Physiologic photic driving was not seen during photic stimulation.  Hyperventilation was not performed.   ABNORMALITY -Continuous slow, generalized IMPRESSION: This study is suggestive of moderate diffuse encephalopathy, nonspecific etiology. No seizures or epileptiform discharges were seen throughout the recording. Lora Havens   ECHOCARDIOGRAM COMPLETE  Result Date: 10/25/2020    ECHOCARDIOGRAM REPORT   Patient Name:   Carol Beard Date of Exam: 10/24/2020 Medical Rec #:  [id number]         Height:       62.0 in Accession #:    [id number]        Weight:       97.2 lb Date of Birth:  March 31, 1946         BSA:          1.407 m Patient Age:    38 years          BP:           151/76 mmHg Patient Gender: F                 HR:           70 bpm. Exam Location:  [facility]  Procedure: 2D Echo, Cardiac Doppler and Color Doppler Indications:     Elevated troponin  History:         Patient has prior history of Echocardiogram examinations, most                  recent 07/06/2013.  Sonographer:     Wilford Sports Rodgers-Jones Referring Phys:  [id number] AMY N COX Diagnosing Phys: Bartholome Bill MD IMPRESSIONS  1. Left ventricular ejection fraction, by estimation, is 65 to 70%. The left ventricle has normal function. The left ventricle has no regional wall motion abnormalities. There is moderate left ventricular hypertrophy. Left ventricular diastolic parameters are consistent with Grade I diastolic dysfunction (impaired relaxation).  2. Right ventricular systolic function is normal. The right ventricular size is normal.  3. The mitral valve is grossly normal. Trivial mitral valve regurgitation.  4. The aortic valve is grossly normal. Aortic valve regurgitation is trivial. FINDINGS  Left Ventricle: Left ventricular ejection fraction, by estimation, is 65 to 70%. The left ventricle has normal function. The left ventricle has no regional wall motion abnormalities. The left ventricular internal cavity size was normal in size. There is  moderate left ventricular hypertrophy. Left ventricular diastolic parameters are consistent with Grade I diastolic dysfunction (impaired relaxation). Right Ventricle: The right ventricular size is normal. No increase in right ventricular wall thickness. Right ventricular systolic function is normal. Left Atrium: Left atrial size was normal in size. Right Atrium: Right atrial size was normal in size. Pericardium: There is no evidence of pericardial effusion. Mitral Valve: The mitral valve is grossly normal. Trivial mitral valve regurgitation. Tricuspid Valve: The tricuspid valve is grossly normal. Tricuspid valve regurgitation is mild. Aortic Valve: The aortic valve is grossly normal. Aortic valve regurgitation is trivial. Pulmonic Valve: The pulmonic valve was not well  visualized. Pulmonic valve regurgitation is trivial. Aorta: The aortic root is normal in size and structure. IAS/Shunts: The interatrial septum was not assessed.  LEFT VENTRICLE PLAX 2D LVIDd:         3.39 cm Diastology LVIDs:         2.04 cm LV e' medial:    5.77 cm/s LV PW:         0.80 cm LV E/e' medial:  11.0 LV IVS:  0.62 cm LV e' lateral:   7.40 cm/s                        LV E/e' lateral: 8.6  RIGHT VENTRICLE             IVC RV Basal diam:  3.53 cm     IVC diam: 0.99 cm RV S prime:     20.00 cm/s TAPSE (M-mode): 2.2 cm LEFT ATRIUM             Index       RIGHT ATRIUM          Index LA diam:        2.50 cm 1.78 cm/m  RA Area:     7.05 cm LA Vol (A2C):   16.9 ml 12.01 ml/m RA Volume:   12.50 ml 8.88 ml/m LA Vol (A4C):   18.0 ml 12.79 ml/m LA Biplane Vol: 17.7 ml 12.58 ml/m   AORTA Ao Root diam: 3.30 cm MITRAL VALVE               TRICUSPID VALVE MV Area (PHT): 2.17 cm    TR Peak grad:   22.7 mmHg MV Decel Time: 349 msec    TR Vmax:        238.00 cm/s MV E velocity: 63.40 cm/s MV A velocity: 88.30 cm/s MV E/A ratio:  0.72 Bartholome Bill MD Electronically signed by Bartholome Bill MD Signature Date/Time: 10/25/2020/7:38:10 AM    Final         Scheduled Meds: . aspirin EC  81 mg Oral Daily  . donepezil  10 mg Oral QHS  . enoxaparin (LOVENOX) injection  30 mg Subcutaneous Q24H  . memantine  10 mg Oral BID  . mirtazapine  7.5 mg Oral QHS   Continuous Infusions:   Assessment & Plan:   Active Problems:   Change in mental status   Debility   Failure to thrive (child)   Dementia without behavioral disturbance ([place])   1.Altered MS-Change in mental status-suspect multifactorial including progression of dementia, failure to thrive and superimposed by chronic and worsening debility, versus vitamin deficiency versus possible infectious etiology versus thyroid -Need to r/o reversible causes -CT head without contrast was negative for evidence of acute intracranial abnormality, similar chronic  microvascular ischemic disease and generalized atrophy 11/10-vitamin B12 normal levels, TSH normal, UA negative With EF 65 to 70%, normal wall motion.  LVH, grade 1 diastolic dysfunction , Influenza negative Toxicology negative, alcohol less than 10 11/11-minimal improvement. Still not at baseline Neurology consulted, spoke to Dr. Leonel Ramsay who believes this is delirium since it occurred acutely. Will obtain EEG and MRI for further evaluation SPL found mild oropharyngeal dysphagia likely related to change in AMS, mild aspiration risk.  Placed on dysphagia 2 diet with thin liquids.  Please see note Once improving, will need PT/OT consult Palliative consult pending    2.Elevated troponin -likely demand ischemia Echo normal EF without regional wall motion abnormalities No plans for ischemic work-up at this time    3.Dementia, differential includes vascular versus Alzheimer -Monitor advance dementia with primary progressive aphasia  Continue donepezil and memantine     4.History of single thyroid nodule- TSH normal, outpatient management     DVT prophylaxis: Lovenox Code Status: Full Family Communication: none at bedside  Status is: inpatient  The patient will require care spanning > 2 midnights and should be moved to inpatient because: Altered mental status  Dispo: The patient  is from: SNF              Anticipated d/c is to: SNF              Anticipated d/c date is: 2 days              Patient currently is not medically stable to d/c.Still not at baseline. Neurology consulted. Palliative consulted. W/u pending            LOS: 1 day   Time spent: 35 minutes with more than 50% on [place], MD [facility] Pager [phone]  If 7PM-7AM, please contact night-coverage [email or web address] Password [facility] 10/26/2020, 3:34 PM

## 2020-10-26 NOTE — Procedures (Signed)
Patient Name: Carol Beard  MRN: [id number]  Epilepsy Attending: Lora Havens  Referring Physician/Provider: Dr. Kathrynn Speed Date: 10/26/2020 Duration: 32.58 minutes  Patient history: 74 year old female with history of dementia and aphasia who presented with worsening mental status. EEG to evaluate for seizures.  Level of alertness: Awake  AEDs during EEG study: None  Technical aspects: This EEG study was done with scalp electrodes positioned according to the 10-20 International system of electrode placement. Electrical activity was acquired at a sampling rate of 500Hz  and reviewed with a high frequency filter of 70Hz  and a low frequency filter of 1Hz . EEG data were recorded continuously and digitally stored.   Description: No posterior dominant rhythm was seen. EEG showed continuous generalized 3 to 6 Hz theta-delta slowing. Physiologic photic driving was not seen during photic stimulation.  Hyperventilation was not performed.     ABNORMALITY -Continuous slow, generalized  IMPRESSION: This study is suggestive of moderate diffuse encephalopathy, nonspecific etiology. No seizures or epileptiform discharges were seen throughout the recording.  Carol Beard [name]

## 2020-10-26 NOTE — Consult Note (Signed)
Neurology Consultation Reason for Consult: Altered mental status Referring Physician: Amery, S  CC: Altered mental status  History is obtained from: Nursing home  HPI: Carol Beard is a 74 y.o. female with a history of dementia and aphasia who presents with worsening mental status over about a week.  Though there is mention in the chart of a fall, when I spoke with the nursing home staff they state that there was no definite fall.  She simply began declining, progressive over a few days.  She became more unsteady and more confused to the point where she was not responding much at all and therefore was brought to the emergency department.  At baseline, she is able to say a few words, her nurse describes a recent time where she came to her and said "I got ice cream" but will not have a full conversation.   ROS: A 14 point ROS was performed and is negative except as noted in the HPI.  Past Medical History:  Diagnosis Date  . Aphasia   . Environmental allergies   . Thyroid nodule    biopsy x 2 negative     Family History  Problem Relation Age of Onset  . Cancer Mother        Colon  . Stroke Father   . Breast cancer Neg Hx      Social History:  reports that she has never smoked. She has never used smokeless tobacco. She reports that she does not drink alcohol and does not use drugs.   Exam: Current vital signs: BP 133/84 (BP Location: Right Arm)   Pulse 70   Temp 98.4 F (36.9 C) (Oral)   Resp 17   Ht 5\' 2"  (1.575 m)   Wt 44.1 kg   SpO2 99%   BMI 17.78 kg/m  Vital signs in last 24 hours: Temp:  [97.8 F (36.6 C)-98.8 F (37.1 C)] 98.4 F (36.9 C) (11/11 1145) Pulse Rate:  [70-80] 70 (11/11 1145) Resp:  [14-17] 17 (11/11 1145) BP: (127-156)/(61-84) 133/84 (11/11 1145) SpO2:  [96 %-100 %] 99 % (11/11 1145)   Physical Exam  Constitutional: Appears elderly Psych: Affect appropriate to situation Eyes: No scleral injection HENT: No OP obstrucion MSK: no joint  deformities.  Cardiovascular: Normal rate and regular rhythm.  Respiratory: Effort normal, non-labored breathing GI: Soft.  No distension. There is no tenderness.  Skin: WDI  Neuro: Mental Status: Patient awakens to noxious stimulation, she fixates and tracks, but does not follow commands.  When I ask her "does this hurt" she states yes, but when I asked her where, she indicates with her hand but cannot get the word out. Cranial Nerves: II: She blinks to threat bilaterally pupils are equal, round, and reactive to light.   III,IV, VI: She tracks me across midline in both directions V: VII: She blinks to eyelid stimulation bilaterally and holds eyes tightly shut at times. Motor: She has mild increased tone versus paratonia in the right upper extremity, but she moves all of her extremities purposefully sensory: She responds to noxious stimulation in all four extremities Deep Tendon Reflexes: 2+ and symmetric in the biceps and patellae.  Cerebellar: She does not perform      I have reviewed labs in epic and the results pertinent to this consultation are: UA-negative No leukocytosis, hemoglobin of 15.3 Creatinine of 0.9 B12 283 TSH 2.83  I have reviewed the images obtained: CT head-unremarkable  Impression: 74 year old female with what appears to be delirium  in the setting of known dementia.  Etiology is currently unclear, and therefore I do think that further evaluation with MRI and EEG would be prudent.  Recommendations: 1) MRI brain 2) EEG 3) will follow.    [name], MD [facility] [phone]  If 7pm- 7am, please page neurology on call as listed in [place].

## 2020-10-26 NOTE — Plan of Care (Signed)
  Problem: Clinical Measurements: Goal: Ability to maintain clinical measurements within normal limits will improve Outcome: Progressing Goal: Will remain free from infection Outcome: Progressing Goal: Respiratory complications will improve Outcome: Progressing Goal: Cardiovascular complication will be avoided Outcome: Progressing   Problem: Health Behavior/Discharge Planning: Goal: Ability to manage health-related needs will improve Outcome: Not Progressing

## 2020-10-27 ENCOUNTER — Encounter: Payer: Self-pay | Admitting: Internal Medicine

## 2020-10-27 DIAGNOSIS — R5381 Other malaise: Secondary | ICD-10-CM | POA: Diagnosis not present

## 2020-10-27 DIAGNOSIS — R4182 Altered mental status, unspecified: Secondary | ICD-10-CM | POA: Diagnosis not present

## 2020-10-27 DIAGNOSIS — F0390 Unspecified dementia without behavioral disturbance: Secondary | ICD-10-CM | POA: Diagnosis not present

## 2020-10-27 DIAGNOSIS — R410 Disorientation, unspecified: Secondary | ICD-10-CM | POA: Diagnosis not present

## 2020-10-27 LAB — CORTISOL: Cortisol, Plasma: 15.1 ug/dL

## 2020-10-27 MED ORDER — THIAMINE HCL 100 MG/ML IJ SOLN
500.0000 mg | Freq: Three times a day (TID) | INTRAVENOUS | Status: AC
  Administered 2020-10-27 – 2020-10-29 (×9): 500 mg via INTRAVENOUS
  Filled 2020-10-27 (×9): qty 5

## 2020-10-27 MED ORDER — CYANOCOBALAMIN 1000 MCG/ML IJ SOLN
1000.0000 ug | Freq: Once | INTRAMUSCULAR | Status: AC
  Administered 2020-10-27: 1000 ug via INTRAMUSCULAR
  Filled 2020-10-27: qty 1

## 2020-10-27 NOTE — Progress Notes (Signed)
Subjective: No significant changes  Exam: Vitals:   10/27/20 0415 10/27/20 0826  BP: (!) 122/49 (!) 110/58  Pulse: 71 65  Resp: 15 15  Temp: 98.1 F (36.7 C) 98.3 F (36.8 C)  SpO2: 94% 95%   Gen: In bed, NAD Resp: non-labored breathing, no acute distress Abd: soft, nt  Neuro: MS: awakens to noxious stimulation, when I ask "Does that hurt?" She answers"god only knows" I ask her to talk to me and she says "I can't" no other verbal output. At this point, she seems to not be engaging with me and does not follow the commands I ask of her.  CN: PERRL, EOMI Motor: MAEW Sensory: responds to nox stim x 4.   Pertinent Labs: Ammonia 25 EEG - negative MRI - negative No leukocytosis UA - negative B12 283  Impression: 74 yo F with a history of dementia including a progressive aphasia component who appears to have gotten worse over the past week. She is behaving as if she has had some acute physiological stressor which can cause worsening, though this is remaining. She is thin, and thiamine deficiency is a consideration, though encephaloapthy is her only symptom, I will check a B1 and start repletion. It is possible that a cause will not be identified, in which case I would expect either gradual improvement towards her previous level of functioning, or this could be a new baseline.   Recommendations: 1) B1 level, start 500mg  TID 2) cortisol level 3) will follow  [name], MD [facility] [phone]  If 7pm- 7am, please page neurology on call as listed in [place].

## 2020-10-27 NOTE — Plan of Care (Signed)
Patient is resting in bed. She does not answer questions, and is not able to participate in a [place] conversation.  Attempted to call patient's brother [name] unsuccessfully at both his mobile number and his home phone number.

## 2020-10-27 NOTE — Evaluation (Addendum)
Physical Therapy Evaluation Patient Details Name: Carol Beard MRN: [id number] DOB: 29-Mar-1946 Today's Date: 10/27/2020   History of Present Illness  Pt is a 74 y.o. female with medical history significant for single nontoxic thyroid nodule, dementia, presented to the emergency department from nursing home facility for complaints of altered mental status within the last week. Per chart at baseline she is normally confused but able to participate and interact and ambulate.    Clinical Impression  Pt easily woken, alert throughout remainder of session with occasional verbal cues to attend to PT. Disoriented x4, very minimal speech noted but pt able to nod/shake head/laugh. Unable to provide PLOF.   The patient was able to perform supine LE and UE exercises with constant tactile and verbal cues. Able to lift all extremities against gravity. Supine to sit with minA especially to facilitate the motion. Sit <> stand with RW and then squat pivot set up attempted; pt unable to come into fully standing, but able to initiate movement. Returned to supine with modA and repositioned for comfort.  Overall the patient demonstrated deficits (see "PT Problem List") that impede the patient's functional abilities, safety, and mobility and would benefit from skilled PT intervention. Per chart review pt is ambulatory and more mobile at baseline, due to acute decline in mobility recommendation is SNF.      Follow Up Recommendations SNF    Equipment Recommendations  Other (comment) (TBD at next venue of care)    Recommendations for Other Services       Precautions / Restrictions Precautions Precautions: Fall Restrictions Weight Bearing Restrictions: No      Mobility  Bed Mobility Overal bed mobility: Needs Assistance Bed Mobility: Supine to Sit;Sit to Supine     Supine to sit: Min assist;HOB elevated Sit to supine: Min assist        Transfers Overall transfer level: Needs  assistance Equipment used: Rolling walker (2 wheeled);2 person hand held assist Transfers: Sit to/from [employer] Sit to Stand: Max assist   Squat pivot transfers: Max assist     General transfer comment: Pt unable to stand without maxA, unable to straighten knees fully  Ambulation/Gait             General Gait Details: deferred due to safety concerns  Stairs            Wheelchair Mobility    Modified Rankin (Stroke Patients Only)       Balance Overall balance assessment: Needs assistance Sitting-balance support: Feet supported Sitting balance-Leahy Scale: Fair       Standing balance-Leahy Scale: Zero                               Pertinent Vitals/Pain Pain Assessment: Faces Faces Pain Scale: No hurt    Home Living Family/patient expects to be discharged to:: Assisted living                      Prior Function           Comments: Pt unable to provide PLOF     Hand Dominance        Extremity/Trunk Assessment   Upper Extremity Assessment Upper Extremity Assessment: Generalized weakness    Lower Extremity Assessment Lower Extremity Assessment: Generalized weakness    Cervical / Trunk Assessment Cervical / Trunk Assessment: Kyphotic  Communication   Communication: Other (comment) (Pt able to shake head yes/no/laugh but did  not verbalize much past this)  Cognition Arousal/Alertness: Awake/alert Behavior During Therapy: WFL for tasks assessed/performed Overall Cognitive Status: History of cognitive impairments - at baseline                                 General Comments: Pt disoriented to all questions, unable to state her name      General Comments      Exercises Other Exercises Other Exercises: AAROM for UE shoulder flexion elbow extension/flexion x5 bilaterally. AAROM LE exercises x5 bilaterally (heel slides/SLR/ankle pumps). Tactile cues/verbal cues for technique throughout    Assessment/Plan    PT Assessment Patient needs continued PT services  PT Problem List Decreased strength;Decreased mobility;Decreased range of motion;Decreased activity tolerance;Decreased balance;Decreased knowledge of use of DME;Decreased cognition       PT Treatment Interventions DME instruction;Therapeutic exercise;Gait training;Balance training;Functional mobility training;Therapeutic activities;Patient/family education;Neuromuscular re-education    PT Goals (Current goals can be found in the Care Plan section)  Acute Rehab PT Goals PT Goal Formulation: Patient unable to participate in goal setting Time For Goal Achievement: 11/10/20 Potential to Achieve Goals: Good    Frequency Min 2X/week   Barriers to discharge        Co-evaluation               AM-PAC PT "6 Clicks" Mobility  Outcome Measure Help needed turning from your back to your side while in a flat bed without using bedrails?: A Little Help needed moving from lying on your back to sitting on the side of a flat bed without using bedrails?: A Little Help needed moving to and from a bed to a chair (including a wheelchair)?: A Lot Help needed standing up from a chair using your arms (e.g., wheelchair or bedside chair)?: A Lot Help needed to walk in hospital room?: A Lot Help needed climbing 3-5 steps with a railing? : Total 6 Click Score: 13    End of Session Equipment Utilized During Treatment: Gait belt Activity Tolerance: Patient tolerated treatment well Patient left: in bed;with call bell/phone within reach;with bed alarm set Nurse Communication: Mobility status PT Visit Diagnosis: Other abnormalities of gait and mobility (R26.89);Muscle weakness (generalized) (M62.81);Difficulty in walking, not elsewhere classified (R26.2);History of falling (Z91.81)    Time: 1340-1404 PT Time Calculation (min) (ACUTE ONLY): 24 min   Charges:   PT Evaluation $PT Eval Moderate Complexity: 1 Mod PT  Treatments $Therapeutic Exercise: 8-22 mins        Lieutenant Diego PT, DPT 2:54 PM,10/27/20

## 2020-10-27 NOTE — TOC Progression Note (Signed)
Transition of Care [facility]) - Progression Note    Patient Details  Name: Carol Beard MRN: [id number] Date of Birth: Jan 22, 1946  Transition of Care [facility]) CM/SW [place], RN Phone Number: 10/27/2020, 3:00 PM  Clinical Narrative:   RNCM reached out to and left VM for patient's brother [name] concerning new recommendation for skilled nursing at rehab.     Expected Discharge Plan: Memory Care Barriers to Discharge: Continued Medical Work up  Expected Discharge Plan and Services Expected Discharge Plan: Memory Care       Living arrangements for the past 2 months: [place] (Memory Care)                                       Social Determinants of Health (SDOH) Interventions    Readmission Risk Interventions No flowsheet data found.

## 2020-10-27 NOTE — NC FL2 (Signed)
[place] LEVEL OF CARE SCREENING TOOL     IDENTIFICATION  Patient Name: Carol Beard Birthdate: 1946-04-13 Sex: female Admission Date (Current Location): 10/24/2020  [facility] and [place] Number:      Facility and Address:  [facility], [address] [place], [place] [address]      Provider Number: [id number]  Attending Physician Name and Address:  Nolberto Hanlon, MD  Relative Name and Phone Number:  Flordia Kassem [phone]    Current Level of Care: Hospital Recommended Level of Care: [place] Prior Approval Number:    Date Approved/Denied:   PASRR Number: [id number]  Discharge Plan: SNF    Current Diagnoses: Patient Active Problem List   Diagnosis Date Noted   Change in mental status 10/24/2020   Debility 10/24/2020   Failure to thrive (child) 10/24/2020   Dementia without behavioral disturbance ([place]) 10/24/2020   Anxiety 06/13/2019   Osteoporosis 09/11/2018   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   Breast nodule 12/24/2015   Headache 12/24/2015   Health care maintenance 06/18/2015   Environmental allergies 06/18/2015   Toenail fungus 07/31/2014   Weight loss 03/28/2014   History of colonic polyps 10/28/2013   Knee pain 09/09/2013   Skin lesion 09/09/2013   Memory change 06/18/2013   Hand numbness 06/18/2013   Fullness of breast 06/16/2013   Thyroid nodule 01/13/2013    Orientation RESPIRATION BLADDER Height & Weight     Self  Normal Incontinent Weight: 44.1 kg Height:  5\' 2"  (157.5 cm)  BEHAVIORAL SYMPTOMS/MOOD NEUROLOGICAL BOWEL NUTRITION STATUS      Incontinent Diet (Dysphagia 2)  AMBULATORY STATUS COMMUNICATION OF NEEDS Skin   Extensive Assist Verbally Normal                       Personal Care Assistance Level of Assistance  Bathing, Feeding, Dressing   Feeding assistance: Limited assistance Dressing Assistance: Maximum assistance     Functional  Limitations Info  Speech, Hearing, Sight Sight Info: Adequate Hearing Info: Adequate Speech Info: Adequate    SPECIAL CARE FACTORS FREQUENCY  OT (By licensed OT), PT (By licensed PT)                    Contractures Contractures Info: Not present    Additional Factors Info  Code Status, Allergies Code Status Info: Full Allergies Info: PCN           Current Medications (10/27/2020):  This is the current hospital active medication list Current Facility-Administered Medications  Medication Dose Route Frequency Provider Last Rate Last Admin   aspirin EC tablet 81 mg  81 mg Oral Daily Cox, Amy N, DO   81 mg at 10/27/20 0914   donepezil (ARICEPT) tablet 10 mg  10 mg Oral QHS Cox, Amy N, DO   10 mg at 10/26/20 2037   enoxaparin (LOVENOX) injection 30 mg  30 mg Subcutaneous Q24H Cox, Amy N, DO   30 mg at 10/26/20 2038   memantine (NAMENDA) tablet 10 mg  10 mg Oral BID Cox, Amy N, DO   10 mg at 10/27/20 0914   mirtazapine (REMERON) tablet 7.5 mg  7.5 mg Oral QHS Cox, Amy N, DO   7.5 mg at 10/26/20 2037   thiamine 500mg  in normal saline (86ml) IVPB  500 mg Intravenous TID Greta Doom, MD   Paused at 10/27/20 1218     Discharge Medications: Please see discharge summary for a list of  discharge medications.  Relevant Imaging Results:  Relevant Lab Results:   Additional Information SS# [id number]  Shelbie Ammons, RN

## 2020-10-27 NOTE — Evaluation (Signed)
Occupational Therapy Evaluation Patient Details Name: Carol Beard MRN: [id number] DOB: 1946/01/14 Today's Date: 10/27/2020    History of Present Illness Pt is a 74 y.o. female with medical history significant for single nontoxic thyroid nodule, dementia, presented to the emergency department from nursing home facility for complaints of altered mental status within the last week. Per chart at baseline she is normally confused but able to participate and interact and ambulate.   Clinical Impression   Pt was seen for OT evaluation this date. Prior to hospital admission, pt was living in memory care; limited PLOF available, however it appears pt may have been somewhat ambulatory. Pt received with RN, alert, and RN feeding pt's dinner. OT facilitated hand under hand approach for self feeding of dinner meal, requiring MAx A hand under hand, increased processing time and cues for initiating, sequencing, and terminating tasks with some RUE muscle resistance at times. Pt able to respond appropriately to simple yes/no questions with time/repetition approx 60% of the time. Alerts and visually attends/tracks. Currently pt demonstrates impairments as described below (See OT problem list) which functionally limit her ability to perform ADL/self-care tasks. Pt currently appears to require increased assist for mobility and ADL Tasks. Pt may benefit from additional skilled OT services to address noted impairments and functional limitations (see below for any additional details) in order to maximize safety and independence while minimizing falls risk and caregiver burden. Upon hospital discharge, recommend STR to maximize pt safety and return to PLOF as well as minimize falls risk, functional decline, and caregiver burden.     Follow Up Recommendations  SNF    Equipment Recommendations  Other (comment) (defer to next venue of care)    Recommendations for Other Services       Precautions / Restrictions  Precautions Precautions: Fall Restrictions Weight Bearing Restrictions: No      Mobility Bed Mobility Overal bed mobility: Needs Assistance Bed Mobility: Supine to Sit;Sit to Supine     Supine to sit: Min assist;HOB elevated Sit to supine: Min assist   General bed mobility comments: deferred    Transfers Overall transfer level: Needs assistance Equipment used: Rolling walker (2 wheeled);2 person hand held assist Transfers: Sit to/from [employer] Sit to Stand: Max assist   Squat pivot transfers: Max assist     General transfer comment: deferred    Balance Overall balance assessment: Needs assistance Sitting-balance support: Feet supported Sitting balance-Leahy Scale: Fair       Standing balance-Leahy Scale: Zero                             ADL either performed or assessed with clinical judgement   ADL Overall ADL's : Needs assistance/impaired Eating/Feeding: Bed level;Cueing for sequencing;Maximal assistance Eating/Feeding Details (indicate cue type and reason): Hand under hand with increased processing time and cues for initiating, sequencing, and terminating tasks with some RUE muscle resistance at times                                   General ADL Comments: Max A for bed level bathing, dressing, and toileting this date; appears that pt's functional abilities wax/wane across the day     Vision   Additional Comments: pt will visually attend and track, unable to verbalize any baseline or current visual deficits     Perception     Praxis  Pertinent Vitals/Pain Pain Assessment: Faces Faces Pain Scale: No hurt     Hand Dominance     Extremity/Trunk Assessment Upper Extremity Assessment Upper Extremity Assessment: Generalized weakness;Difficult to assess due to impaired cognition   Lower Extremity Assessment Lower Extremity Assessment: Generalized weakness;Difficult to assess due to impaired cognition    Cervical / Trunk Assessment Cervical / Trunk Assessment: Kyphotic   Communication Communication Communication: Expressive difficulties;Receptive difficulties (hx dementia, intermittently can respond to yes/no questions)   Cognition Arousal/Alertness: Awake/alert Behavior During Therapy: Flat affect Overall Cognitive Status: History of cognitive impairments - at baseline                                 General Comments: Pt with limited verbal communication, will visually track and alert and turn to OT when name called, responds to yes/no questions ~60% of the time.   General Comments       Exercises Other Exercises Other Exercises: AAROM for UE shoulder flexion elbow extension/flexion x5 bilaterally. AAROM LE exercises x5 bilaterally (heel slides/SLR/ankle pumps). Tactile cues/verbal cues for technique throughout Other Exercises: self feeding dinner requiring max assist hand under hand, cues for safety/sequencing, initiating, terminating task   Shoulder Instructions      Home Living Family/patient expects to be discharged to:: Other (Comment) (memory care)                                        Prior Functioning/Environment          Comments: Pt unable to provide PLOF        OT Problem List: Decreased strength;Decreased coordination;Decreased cognition;Decreased safety awareness;Decreased activity tolerance;Impaired balance (sitting and/or standing);Decreased knowledge of use of DME or AE      OT Treatment/Interventions: Self-care/ADL training;Therapeutic exercise;Therapeutic activities;DME and/or AE instruction;Patient/family education;Balance training    OT Goals(Current goals can be found in the care plan section) Acute Rehab OT Goals Patient Stated Goal: pt unable to verbalize OT Goal Formulation: Patient unable to participate in goal setting Time For Goal Achievement: 11/10/20 Potential to Achieve Goals: Fair ADL Goals Pt Will  Perform Eating: sitting;with max assist (supported sitting, hand under hand assist, cues to initiate/sequenc) Pt Will Transfer to Toilet: with mod assist;bedside commode;stand pivot transfer Additional ADL Goal #1: PT will perform bed mobility with Min A in preparation for ADL Tasks, requiring max simple cues for initiation/sequencing.  OT Frequency: Min 1X/week   Barriers to D/C:            Co-evaluation              AM-PAC OT "6 Clicks" Daily Activity     Outcome Measure Help from another person eating meals?: A Lot Help from another person taking care of personal grooming?: A Lot Help from another person toileting, which includes using toliet, bedpan, or urinal?: A Lot Help from another person bathing (including washing, rinsing, drying)?: A Lot Help from another person to put on and taking off regular upper body clothing?: A Lot Help from another person to put on and taking off regular lower body clothing?: A Lot 6 Click Score: 12   End of Session Nurse Communication: Mobility status;Other (comment) (status of meal)  Activity Tolerance: Patient tolerated treatment well Patient left: in bed;with call bell/phone within reach;with bed alarm set  OT Visit Diagnosis: Other abnormalities of gait  and mobility (R26.89);Muscle weakness (generalized) (M62.81);Other symptoms and signs involving cognitive function                Time: 7889-3388 OT Time Calculation (min): 29 min Charges:  OT General Charges $OT Visit: 1 Visit OT Evaluation $OT Eval High Complexity: 1 High OT Treatments $Self Care/Home Management : 23-37 mins  Jeni Salles, MPH, MS, OTR/L ascom [phone] 10/27/20, 4:10 PM

## 2020-10-27 NOTE — TOC Progression Note (Signed)
Transition of Care [facility]) - Progression Note    Patient Details  Name: Carol Beard MRN: [id number] Date of Birth: 12-Nov-1946  [facility] CM/SW [place], RN Phone Number: 10/27/2020, 3:45 PM  Clinical Narrative:   RNCM reached out to patient's brother [name] and left VM for return call. He returned call shortly and discussion was had about recommendations for patient to go to SNF at discharge. [name] had many questions about patient's current medical issues and chances of her returning to her PLOF. RNCM encouraged him to return call to Palliative NP who had called him so that they could further discuss. [name] was agreeable to a bed search which was initiated.  RNCM completed PASSR, FL-2 and started bed search.     Expected Discharge Plan: Memory Care Barriers to Discharge: Continued Medical Work up  Expected Discharge Plan and Services Expected Discharge Plan: Memory Care       Living arrangements for the past 2 months: [place] (Memory Care)                                       Social Determinants of Health (SDOH) Interventions    Readmission Risk Interventions No flowsheet data found.

## 2020-10-27 NOTE — Progress Notes (Signed)
OT Cancellation Note  Patient Details Name: Carol Beard MRN: [id number] DOB: 1946-04-07   Cancelled Treatment:    Reason Eval/Treat Not Completed: Patient's level of consciousness. Consult received, chart reviewed. Pt lethargic, does not open eyes or respond to stimuli. Head repositioned for comfort/joint integrity. Per RN, pt's mentation improves later in the day. Will re-attempt.   Jeni Salles, MPH, MS, OTR/L ascom [phone] 10/27/20, 10:25 AM

## 2020-10-27 NOTE — Progress Notes (Signed)
PROGRESS NOTE    Carol Beard  JOA:[id number] DOB: [birth year] DOA: 10/24/2020 PCP: Patient, No Pcp Per    Brief Narrative:  Carol Beard is a 74 y.o. female with medical history significant for single nontoxic thyroid nodule, dementia, presented to the emergency department from nursing home facility for complaints of altered mental status within the last week. Per ED provider note, patient fell against the wall and hit her head against the wall about a week ago and then hit the floor.  They did not send her to the emergency department because she was okay afterward.  In the past week she was treated with antibiotics for presumed UTI.  on admission day, when staff checked on her they found that she was too weak to get out of bed, not interactive, and very confused.  This is apparently different from her baseline as she is normally confused but able to participate and interact and ambulate.  11/11-minimal communication . Pt reports yes /no to some questions 11/12- nonverbal this am.   Consultants:   Neurology  Procedures: CT head  Antimicrobials:       Subjective: Sleepy, nonverbal  Objective: Vitals:   10/27/20 0007 10/27/20 0415 10/27/20 0826 10/27/20 1152  BP: 134/64 (!) 122/49 (!) 110/58 140/67  Pulse: 72 71 65 (!) 57  Resp: 15 15 15 14   Temp: 98.6 F (37 C) 98.1 F (36.7 C) 98.3 F (36.8 C) 97.9 F (36.6 C)  TempSrc:   Oral Oral  SpO2: 98% 94% 95% 98%  Weight:      Height:        Intake/Output Summary (Last 24 hours) at 10/27/2020 1516 Last data filed at 10/27/2020 1511 Gross per 24 hour  Intake 242.78 ml  Output 1000 ml  Net -757.22 ml   Filed Weights   10/24/20 1813  Weight: 44.1 kg    Examination: Sleepy, eyes closed, does not open eyes much, mostly nonverbal cta no w/r/r anteriorly Regular s1/s2 Soft benign, +bs No edema Unable to assess mood or neuro exam   Data Reviewed: I have personally reviewed following labs and imaging  studies  CBC: Recent Labs  Lab 10/24/20 1045 10/25/20 0546  WBC 8.4 7.0  NEUTROABS 6.3  --   HGB 15.3* 12.8  HCT 46.7* 37.8  MCV 101.5* 99.5  PLT 185 601   Basic Metabolic Panel: Recent Labs  Lab 10/24/20 1045 10/25/20 0546  NA 139 141  K 4.4 3.9  CL 99 106  CO2 26 24  GLUCOSE 105* 95  BUN 17 13  CREATININE 0.92 0.68  CALCIUM 9.3 8.7*   GFR: Estimated Creatinine Clearance: 43.6 mL/min (by C-G formula based on SCr of 0.68 mg/dL). Liver Function Tests: Recent Labs  Lab 10/24/20 1045  AST 47*  ALT 31  ALKPHOS 79  BILITOT 0.9  PROT 8.4*  ALBUMIN 4.2   No results for input(s): LIPASE, AMYLASE in the last 168 hours. Recent Labs  Lab 10/26/20 1401  AMMONIA 25   Coagulation Profile: Recent Labs  Lab 10/24/20 1801  INR 1.0   Cardiac Enzymes: No results for input(s): CKTOTAL, CKMB, CKMBINDEX, TROPONINI in the last 168 hours. BNP (last 3 results) No results for input(s): PROBNP in the last 8760 hours. HbA1C: No results for input(s): HGBA1C in the last 72 hours. CBG: No results for input(s): GLUCAP in the last 168 hours. Lipid Profile: No results for input(s): CHOL, HDL, LDLCALC, TRIG, CHOLHDL, LDLDIRECT in the last 72 hours. Thyroid Function Tests: Recent Labs  10/24/20 1801  TSH 2.835   Anemia Panel: Recent Labs    10/24/20 1801  VITAMINB12 283   Sepsis Labs: No results for input(s): PROCALCITON, LATICACIDVEN in the last 168 hours.  Recent Results (from the past 240 hour(s))  Respiratory Panel by RT PCR (Flu A&B, Covid) - Nasopharyngeal Swab     Status: None   Collection Time: 10/24/20 11:23 AM   Specimen: Nasopharyngeal Swab  Result Value Ref Range Status   SARS Coronavirus 2 by RT PCR NEGATIVE NEGATIVE Final    Comment: (NOTE) SARS-CoV-2 target nucleic acids are NOT DETECTED.  The SARS-CoV-2 RNA is generally detectable in upper respiratoy specimens during the acute phase of infection. The lowest concentration of SARS-CoV-2 viral  copies this assay can detect is 131 copies/mL. A negative result does not preclude SARS-Cov-2 infection and should not be used as the sole basis for treatment or other patient management decisions. A negative result may occur with  improper specimen collection/handling, submission of specimen other than nasopharyngeal swab, presence of viral mutation(s) within the areas targeted by this assay, and inadequate number of viral copies (<131 copies/mL). A negative result must be combined with clinical observations, patient history, and epidemiological information. The expected result is Negative.  Fact Sheet for Patients:  PinkCheek.be  Fact Sheet for Healthcare Providers:  GravelBags.it  This test is no t yet approved or cleared by the [place] FDA and  has been authorized for detection and/or diagnosis of SARS-CoV-2 by FDA under an Emergency Use Authorization (EUA). This EUA will remain  in effect (meaning this test can be used) for the duration of the COVID-19 declaration under Section 564(b)(1) of the Act, 21 U.S.C. section 360bbb-3(b)(1), unless the authorization is terminated or revoked sooner.     Influenza A by PCR NEGATIVE NEGATIVE Final   Influenza B by PCR NEGATIVE NEGATIVE Final    Comment: (NOTE) The Xpert Xpress SARS-CoV-2/FLU/RSV assay is intended as an aid in  the diagnosis of influenza from Nasopharyngeal swab specimens and  should not be used as a sole basis for treatment. Nasal washings and  aspirates are unacceptable for Xpert Xpress SARS-CoV-2/FLU/RSV  testing.  Fact Sheet for Patients: PinkCheek.be  Fact Sheet for Healthcare Providers: GravelBags.it  This test is not yet approved or cleared by the [place] FDA and  has been authorized for detection and/or diagnosis of SARS-CoV-2 by  FDA under an Emergency Use Authorization (EUA). This  EUA will remain  in effect (meaning this test can be used) for the duration of the  Covid-19 declaration under Section 564(b)(1) of the Act, 21  U.S.C. section 360bbb-3(b)(1), unless the authorization is  terminated or revoked. Performed at [facility], [address], [place], [place] [address]          Radiology Studies: EEG  Result Date: 10/26/2020 Lora Havens, MD     10/26/2020  2:16 PM Patient Name: Carol Beard MRN: [id number] Epilepsy Attending: Lora Havens Referring Physician/Provider: Dr. Kathrynn Speed Date: 10/26/2020 Duration: 32.58 minutes Patient history: 74 year old female with history of dementia and aphasia who presented with worsening mental status. EEG to evaluate for seizures. Level of alertness: Awake AEDs during EEG study: None Technical aspects: This EEG study was done with scalp electrodes positioned according to the 10-20 International system of electrode placement. Electrical activity was acquired at a sampling rate of 500Hz  and reviewed with a high frequency filter of 70Hz  and a low frequency filter of 1Hz . EEG data were recorded continuously  and digitally stored. Description: No posterior dominant rhythm was seen. EEG showed continuous generalized 3 to 6 Hz theta-delta slowing. Physiologic photic driving was not seen during photic stimulation.  Hyperventilation was not performed.   ABNORMALITY -Continuous slow, generalized IMPRESSION: This study is suggestive of moderate diffuse encephalopathy, nonspecific etiology. No seizures or epileptiform discharges were seen throughout the recording. Lora Havens   MR BRAIN WO CONTRAST  Result Date: 10/26/2020 CLINICAL DATA:  Stroke follow-up EXAM: MRI HEAD WITHOUT CONTRAST TECHNIQUE: Multiplanar, multiecho pulse sequences of the brain and surrounding structures were obtained without intravenous contrast. COMPARISON:  09/11/2016 FINDINGS: Brain: No acute infarct, acute hemorrhage or  extra-axial collection. Early confluent hyperintense T2-weighted signal of the periventricular and deep white matter. There is generalized atrophy without lobar predilection. Bifrontal hemosiderin deposition. Normal midline structures. Vascular: Normal flow voids. Skull and upper cervical spine: Normal marrow signal. Sinuses/Orbits: Left mastoid effusion. Normal orbits. Nasopharynx is clear. Other: None IMPRESSION: 1. No acute intracranial abnormality. 2. Generalized atrophy and findings of chronic microvascular disease. 3. Left mastoid effusion. Electronically Signed   By: Ulyses Jarred M.D.   On: 10/26/2020 23:25        Scheduled Meds: . aspirin EC  81 mg Oral Daily  . donepezil  10 mg Oral QHS  . enoxaparin (LOVENOX) injection  30 mg Subcutaneous Q24H  . memantine  10 mg Oral BID  . mirtazapine  7.5 mg Oral QHS   Continuous Infusions: . thiamine injection Stopped (10/27/20 1218)    Assessment & Plan:   Active Problems:   Change in mental status   Debility   Failure to thrive (child)   Dementia without behavioral disturbance ([place])   1.Altered MS-Change in mental status-suspect multifactorial including progression of dementia, failure to thrive and superimposed by chronic and worsening debility, versus vitamin deficiency versus possible infectious etiology versus thyroid -Need to r/o reversible causes -CT head without contrast was negative for evidence of acute intracranial abnormality, similar chronic microvascular ischemic disease and generalized atrophy 11/10-vitamin B12 normal levels, TSH normal, UA negative With EF 65 to 70%, normal wall motion.  LVH, grade 1 diastolic dysfunction , Influenza negative Toxicology negative, alcohol less than 10 11/11-minimal improvement. Still not at baseline Neurology consulted, spoke to Dr. Leonel Ramsay who believes this is delirium since it occurred acutely. SPL found mild oropharyngeal dysphagia likely related to change in AMS, mild  aspiration risk.  Placed on dysphagia 2 diet with thin liquids.  Please see note Once improving, will need PT/OT consult Palliative consult following 11/12- MRI -no acute intracranial abnormality EEG completed-reveals moderate diffuse encephalopathy, nonspecific etiology.  No seizures or epileptiform discharges seen. Per neurology since she is then thiamine deficiency can be a consideration and will need to check B1 and start repletion Also recommended repleting B12 although is on the low normal side Per neurology she is behaving as if she had some acute physiologic stressors which can cause worsening though this is remaining.  It is possible the cause will not be identified in which case expected either gradual improvement toward her previous level of functioning or this could be her new baseline Ck cortisol level.  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  Status is: inpatient  The patient will require care spanning > 2 midnights and should be moved to inpatient because: Altered mental status  Dispo: The patient is from: SNF              Anticipated d/c is to: SNF              Anticipated d/c date is: 2 days              Patient currently is not medically stable to d/c. pt needs higher level of care, will need SNF placement. D/w case mx. Currently getting tx and w/u pending. Once cleared by neurology, will dc to snf when bed available.            LOS: 2 days   Time spent: 35  minutes with more than 50% on [place], MD [facility] Pager [phone]  If 7PM-7AM, please contact night-coverage [email or web address] Password [facility] 10/27/2020, 3:16 PM

## 2020-10-27 NOTE — Progress Notes (Signed)
  Speech Language Pathology Treatment: Dysphagia  Patient Details Name: Carol Beard MRN: [id number] DOB: August 01, 1946 Today's Date: 10/27/2020 Time: 6294-7654 SLP Time Calculation (min) (ACUTE ONLY): 19 min  Assessment / Plan / Recommendation Clinical Impression  Skilled treatment session targeted pt's ongoing dysphagia goals. Pt was sleeping and required more than a reasonable time to become alert. Once awake and alert, pt declined food but did consume water via straw. Pt was free of overt s/s of aspiration even with consecutive challenging sips. This Probation officer spoke with pt's nurse who reports that pt has been consuming her medicine crushed in puree without any difficulty. Given pt's cognitive deficits, she is at risk for dehydration and malnutrition. Continue to support conversations with Palliative Care for [place]. ST intervention is no longer indicated and ST will sign off.    HPI HPI: Carol Beard is a 74 y.o. female with medical history significant for single nontoxic thyroid nodule, dementia, presented to the emergency department from nursing home facility for complaints of altered mental status within the last week. Pt recently fell at her memory care facility. CT head and chest are negative for any acute abnormalities.       SLP Plan  All goals met       Recommendations  Diet recommendations: Dysphagia 2 (fine chop);Thin liquid Liquids provided via: Straw Medication Administration: Crushed with puree Supervision: Staff to assist with self feeding;Full supervision/cueing for compensatory strategies Compensations: Minimize environmental distractions;Slow rate;Small sips/bites Postural Changes and/or Swallow Maneuvers: Seated upright 90 degrees;Upright 30-60 min after meal                Oral Care Recommendations: Oral care BID Follow up Recommendations: Skilled Nursing facility SLP Visit Diagnosis: Dysphagia, unspecified (R13.10) Plan: All goals met       GO                Gilad Dugger B. Rutherford Nail M.S., CCC-SLP, [place] Office [phone]  Stormy Fabian 10/27/2020, 8:26 AM

## 2020-10-28 DIAGNOSIS — R410 Disorientation, unspecified: Secondary | ICD-10-CM | POA: Diagnosis not present

## 2020-10-28 DIAGNOSIS — F0390 Unspecified dementia without behavioral disturbance: Secondary | ICD-10-CM | POA: Diagnosis not present

## 2020-10-28 DIAGNOSIS — R4182 Altered mental status, unspecified: Secondary | ICD-10-CM | POA: Diagnosis not present

## 2020-10-28 LAB — METHYLMALONIC ACID, SERUM: Methylmalonic Acid, Quantitative: 224 nmol/L (ref 0–378)

## 2020-10-28 MED ORDER — VITAMIN B-12 1000 MCG PO TABS
1000.0000 ug | ORAL_TABLET | Freq: Every day | ORAL | Status: DC
  Administered 2020-10-28 – 2020-10-31 (×4): 1000 ug via ORAL
  Filled 2020-10-28 (×4): qty 1

## 2020-10-28 NOTE — TOC Progression Note (Signed)
Transition of Care [facility]) - Progression Note    Patient Details  Name: Carol Beard MRN: [id number] Date of Birth: 03/08/1946  [facility] of [facility]) CM/SW Contact  Izola Price, RN Phone Number: 10/28/2020, 2:57 PM  Clinical Narrative:   SNF initiated 10/27/20. So far [facility], and [facility] in [place] have accepted. Pt. Home address listed as [place], [place] c/o brother Erica [place], [phone]. Will attempt to contact to verify geographic preference. Simmie Davies RN CM.     Expected Discharge Plan: Memory Care Barriers to Discharge: Continued Medical Work up  Expected Discharge Plan and Services Expected Discharge Plan: Memory Care       Living arrangements for the past 2 months: [place] (Memory Care)                                       Social Determinants of Health (SDOH) Interventions    Readmission Risk Interventions No flowsheet data found.

## 2020-10-28 NOTE — Progress Notes (Signed)
Subjective: She appears more awake today  Exam: Vitals:   10/28/20 0424 10/28/20 0755  BP: 114/60 130/84  Pulse: 68 65  Resp: 15 16  Temp: 98.1 F (36.7 C) 98.6 F (37 C)  SpO2: 97% 98%   Gen: In bed, NAD Resp: non-labored breathing, no acute distress Abd: soft, nt  Neuro: MS: She is awake, when I hold up my hand to shake her hand, she shakes my hand.  She closes her eyes to command, but does not stick out her tongue to command.  She answers yes or no questions, and it does appear to be at least moderately appropriate. CN: PERRL, EOMI Motor: MAEW Sensory: responds to nox stim x 4.   Pertinent Labs: Ammonia 25 EEG - negative MRI - negative No leukocytosis UA - negative B12 283  Impression: 74 yo F with a history of dementia including a progressive aphasia component who appears to have gotten worse over the past week. She is behaving as if she has had some acute physiological stressor which can cause worsening, though no clear etiology has been identified. She is thin, and thiamine deficiency is a consideration, though encephaloapthy is her only symptom, I will continue repletion. It is possible that a cause will not be identified, in which case I would expect either gradual improvement towards her previous level of functioning, or this could be a new baseline.  Recommendations: 1) continue 500mg  TID 2) will follow  [name], MD [facility] [phone]  If 7pm- 7am, please page neurology on call as listed in [place].

## 2020-10-28 NOTE — Progress Notes (Signed)
PROGRESS NOTE    Carol Beard  AJG:[id number] DOB: Jan 18, 1946 DOA: 10/24/2020 PCP: Patient, No Pcp Per    Brief Narrative:  Carol Beard is a 74 y.o. female with medical history significant for single nontoxic thyroid nodule, dementia, presented to the emergency department from nursing home facility for complaints of altered mental status within the last week. Per ED provider note, patient fell against the wall and hit her head against the wall about a week ago and then hit the floor.  They did not send her to the emergency department because she was okay afterward.  In the past week she was treated with antibiotics for presumed UTI.  on admission day, when staff checked on her they found that she was too weak to get out of bed, not interactive, and very confused.  This is apparently different from her baseline as she is normally confused but able to participate and interact and ambulate.  11/11-minimal communication . Pt reports yes /no to some questions 11/12- nonverbal this am.  11/13- eyes more open, more communicative , per nsg ate breakfast.   Consultants:   Neurology  Procedures: CT head  Antimicrobials:       Subjective: More awake today.  More communicative than yesterday.  Answers yes no to my questions.  Smiles while trying to talk to me.  Objective: Vitals:   10/27/20 2003 10/28/20 0424 10/28/20 0755 10/28/20 1128  BP: (!) 142/81 114/60 130/84 115/62  Pulse: 74 68 65 62  Resp: 16 15 16 15   Temp: 98.9 F (37.2 C) 98.1 F (36.7 C) 98.6 F (37 C) 98.6 F (37 C)  TempSrc:  Oral Oral Oral  SpO2: 96% 97% 98% 97%  Weight:      Height:        Intake/Output Summary (Last 24 hours) at 10/28/2020 1400 Last data filed at 10/28/2020 0751 Gross per 24 hour  Intake 102.78 ml  Output 300 ml  Net -197.22 ml   Filed Weights   10/24/20 1813  Weight: 44.1 kg    Examination: Looks more alert and more communicative, NAD CTA, no wheeze rales  rhonchi's Regular S1-S2 no murmurs Soft benign positive bowel sounds No edema Mood and affect appropriate in current setting   Data Reviewed: I have personally reviewed following labs and imaging studies  CBC: Recent Labs  Lab 10/24/20 1045 10/25/20 0546  WBC 8.4 7.0  NEUTROABS 6.3  --   HGB 15.3* 12.8  HCT 46.7* 37.8  MCV 101.5* 99.5  PLT 185 355   Basic Metabolic Panel: Recent Labs  Lab 10/24/20 1045 10/25/20 0546  NA 139 141  K 4.4 3.9  CL 99 106  CO2 26 24  GLUCOSE 105* 95  BUN 17 13  CREATININE 0.92 0.68  CALCIUM 9.3 8.7*   GFR: Estimated Creatinine Clearance: 43.6 mL/min (by C-G formula based on SCr of 0.68 mg/dL). Liver Function Tests: Recent Labs  Lab 10/24/20 1045  AST 47*  ALT 31  ALKPHOS 79  BILITOT 0.9  PROT 8.4*  ALBUMIN 4.2   No results for input(s): LIPASE, AMYLASE in the last 168 hours. Recent Labs  Lab 10/26/20 1401  AMMONIA 25   Coagulation Profile: Recent Labs  Lab 10/24/20 1801  INR 1.0   Cardiac Enzymes: No results for input(s): CKTOTAL, CKMB, CKMBINDEX, TROPONINI in the last 168 hours. BNP (last 3 results) No results for input(s): PROBNP in the last 8760 hours. HbA1C: No results for input(s): HGBA1C in the last 72 hours.  CBG: No results for input(s): GLUCAP in the last 168 hours. Lipid Profile: No results for input(s): CHOL, HDL, LDLCALC, TRIG, CHOLHDL, LDLDIRECT in the last 72 hours. Thyroid Function Tests: No results for input(s): TSH, T4TOTAL, FREET4, T3FREE, THYROIDAB in the last 72 hours. Anemia Panel: No results for input(s): VITAMINB12, FOLATE, FERRITIN, TIBC, IRON, RETICCTPCT in the last 72 hours. Sepsis Labs: No results for input(s): PROCALCITON, LATICACIDVEN in the last 168 hours.  Recent Results (from the past 240 hour(s))  Respiratory Panel by RT PCR (Flu A&B, Covid) - Nasopharyngeal Swab     Status: None   Collection Time: 10/24/20 11:23 AM   Specimen: Nasopharyngeal Swab  Result Value Ref Range Status    SARS Coronavirus 2 by RT PCR NEGATIVE NEGATIVE Final    Comment: (NOTE) SARS-CoV-2 target nucleic acids are NOT DETECTED.  The SARS-CoV-2 RNA is generally detectable in upper respiratoy specimens during the acute phase of infection. The lowest concentration of SARS-CoV-2 viral copies this assay can detect is 131 copies/mL. A negative result does not preclude SARS-Cov-2 infection and should not be used as the sole basis for treatment or other patient management decisions. A negative result may occur with  improper specimen collection/handling, submission of specimen other than nasopharyngeal swab, presence of viral mutation(s) within the areas targeted by this assay, and inadequate number of viral copies (<131 copies/mL). A negative result must be combined with clinical observations, patient history, and epidemiological information. The expected result is Negative.  Fact Sheet for Patients:  PinkCheek.be  Fact Sheet for Healthcare Providers:  GravelBags.it  This test is no t yet approved or cleared by the [place] FDA and  has been authorized for detection and/or diagnosis of SARS-CoV-2 by FDA under an Emergency Use Authorization (EUA). This EUA will remain  in effect (meaning this test can be used) for the duration of the COVID-19 declaration under Section 564(b)(1) of the Act, 21 U.S.C. section 360bbb-3(b)(1), unless the authorization is terminated or revoked sooner.     Influenza A by PCR NEGATIVE NEGATIVE Final   Influenza B by PCR NEGATIVE NEGATIVE Final    Comment: (NOTE) The Xpert Xpress SARS-CoV-2/FLU/RSV assay is intended as an aid in  the diagnosis of influenza from Nasopharyngeal swab specimens and  should not be used as a sole basis for treatment. Nasal washings and  aspirates are unacceptable for Xpert Xpress SARS-CoV-2/FLU/RSV  testing.  Fact Sheet for  Patients: PinkCheek.be  Fact Sheet for Healthcare Providers: GravelBags.it  This test is not yet approved or cleared by the [place] FDA and  has been authorized for detection and/or diagnosis of SARS-CoV-2 by  FDA under an Emergency Use Authorization (EUA). This EUA will remain  in effect (meaning this test can be used) for the duration of the  Covid-19 declaration under Section 564(b)(1) of the Act, 21  U.S.C. section 360bbb-3(b)(1), unless the authorization is  terminated or revoked. Performed at [facility], [address] [place], [place] [address]          Radiology Studies: MR BRAIN WO CONTRAST  Result Date: 10/26/2020 CLINICAL DATA:  Stroke follow-up EXAM: MRI HEAD WITHOUT CONTRAST TECHNIQUE: Multiplanar, multiecho pulse sequences of the brain and surrounding structures were obtained without intravenous contrast. COMPARISON:  09/11/2016 FINDINGS: Brain: No acute infarct, acute hemorrhage or extra-axial collection. Early confluent hyperintense T2-weighted signal of the periventricular and deep white matter. There is generalized atrophy without lobar predilection. Bifrontal hemosiderin deposition. Normal midline structures. Vascular: Normal flow voids. Skull and upper  cervical spine: Normal marrow signal. Sinuses/Orbits: Left mastoid effusion. Normal orbits. Nasopharynx is clear. Other: None IMPRESSION: 1. No acute intracranial abnormality. 2. Generalized atrophy and findings of chronic microvascular disease. 3. Left mastoid effusion. Electronically Signed   By: Ulyses Jarred M.D.   On: 10/26/2020 23:25        Scheduled Meds: . aspirin EC  81 mg Oral Daily  . donepezil  10 mg Oral QHS  . enoxaparin (LOVENOX) injection  30 mg Subcutaneous Q24H  . memantine  10 mg Oral BID  . mirtazapine  7.5 mg Oral QHS   Continuous Infusions: . thiamine injection 500 mg (10/28/20 0827)    Assessment & Plan:    Active Problems:   Change in mental status   Debility   Failure to thrive (child)   Dementia without behavioral disturbance ([place])   1.Altered MS-Change in mental status-suspect multifactorial including progression of dementia, failure to thrive and superimposed by chronic and worsening debility, versus vitamin deficiency versus possible infectious etiology versus thyroid -Need to r/o reversible causes -CT head without contrast was negative for evidence of acute intracranial abnormality, similar chronic microvascular ischemic disease and generalized atrophy 11/10-vitamin B12 normal levels, TSH normal, UA negative With EF 65 to 70%, normal wall motion.  LVH, grade 1 diastolic dysfunction , Influenza negative Toxicology negative, alcohol less than 10 11/11-minimal improvement. Still not at baseline Neurology consulted, spoke to Dr. Leonel Ramsay who believes this is delirium since it occurred acutely. SPL found mild oropharyngeal dysphagia likely related to change in AMS, mild aspiration risk.  Placed on dysphagia 2 diet with thin liquids.  Please see note Once improving, will need PT/OT consult Palliative consult following 11/12- MRI -no acute intracranial abnormality EEG completed-reveals moderate diffuse encephalopathy, nonspecific etiology.  No seizures or epileptiform discharges seen. Per neurology since she is then thiamine deficiency can be a consideration and  11/13-B1 pending  Per neurology continue thiamine supplementation 3 times daily  Also will continue B12 p.o. x7 days or until discharge  Given B12 injection x1 yesterday Cortisol normal Per neuro she is behaving as if she has some acute physiologic stressor which can cause worsening, though no clear etiology has been identified.  She is sent and thiamine deficiency is a consideration, though encephalopathy is her only symptom, continue repletion. 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  Outpatient management    DVT prophylaxis: Lovenox Code Status: Full Family Communication: Updated brother Carol Beard about above assessment and findings.  He is agreeable with patient going to SNF.  Have notified case management to speak to him.  Status is: inpatient  Patient is inpatient due to unsafe discharge  Dispo: The patient is from: SNF              Anticipated d/c is to: SNF              Anticipated d/c date is: 2 days              Patient currently medically stable, needs SNF placement pending.  LOS: 3 days   Time spent: 35 minutes with more than 50% on [place], MD [facility] Pager [phone]  If 7PM-7AM, please contact night-coverage [email or web address] Password [email or web address] 10/28/2020, 2:00 PM

## 2020-10-29 DIAGNOSIS — E041 Nontoxic single thyroid nodule: Secondary | ICD-10-CM | POA: Diagnosis not present

## 2020-10-29 DIAGNOSIS — F0390 Unspecified dementia without behavioral disturbance: Secondary | ICD-10-CM | POA: Diagnosis not present

## 2020-10-29 DIAGNOSIS — R410 Disorientation, unspecified: Secondary | ICD-10-CM | POA: Diagnosis not present

## 2020-10-29 DIAGNOSIS — R4182 Altered mental status, unspecified: Secondary | ICD-10-CM | POA: Diagnosis not present

## 2020-10-29 NOTE — Progress Notes (Signed)
PROGRESS NOTE    Carol Beard  JKK:[id number] DOB: [birth year] DOA: 10/24/2020 PCP: Patient, No Pcp Per    Brief Narrative:  Carol Beard is a 74 y.o. female with medical history significant for single nontoxic thyroid nodule, dementia, presented to the emergency department from nursing home facility for complaints of altered mental status within the last week. Per ED provider note, patient fell against the wall and hit her head against the wall about a week ago and then hit the floor.  They did not send her to the emergency department because she was okay afterward.  In the past week she was treated with antibiotics for presumed UTI.  on admission day, when staff checked on her they found that she was too weak to get out of bed, not interactive, and very confused.  This is apparently different from her baseline as she is normally confused but able to participate and interact and ambulate.  11/11-minimal communication . Pt reports yes /no to some questions 11/12- nonverbal this am.  11/13- eyes more open, more communicative , per nsg ate breakfast. 11/14- sleepy, keeps eyes closed during my exam early this am.  No overnight issues  Consultants:   Neurology  Procedures: CT head  Antimicrobials:       Subjective: Not very participating in exam quiet sleepy  Objective: Vitals:   10/29/20 0013 10/29/20 0438 10/29/20 0814 10/29/20 1227  BP: 122/62 118/66 129/69 (!) 110/53  Pulse: 84 68 (!) 58 65  Resp: 16 16 17 17   Temp: 98.2 F (36.8 C) 98.5 F (36.9 C) 97.7 F (36.5 C) 97.8 F (36.6 C)  TempSrc: Oral Oral Oral Oral  SpO2: 98% 96% 98% 97%  Weight:      Height:        Intake/Output Summary (Last 24 hours) at 10/29/2020 1425 Last data filed at 10/29/2020 0753 Gross per 24 hour  Intake 270 ml  Output 800 ml  Net -530 ml   Filed Weights   10/24/20 1813  Weight: 44.1 kg    Examination: Sleepy, NAD, comfortable Anteriorly CTA, no wheeze rales Regular  S1-S2 no murmurs Abdomen soft nontender nondistended positive bowel sounds No edema Unable to examine neuro  Data Reviewed: I have personally reviewed following labs and imaging studies  CBC: Recent Labs  Lab 10/24/20 1045 10/25/20 0546  WBC 8.4 7.0  NEUTROABS 6.3  --   HGB 15.3* 12.8  HCT 46.7* 37.8  MCV 101.5* 99.5  PLT 185 716   Basic Metabolic Panel: Recent Labs  Lab 10/24/20 1045 10/25/20 0546  NA 139 141  K 4.4 3.9  CL 99 106  CO2 26 24  GLUCOSE 105* 95  BUN 17 13  CREATININE 0.92 0.68  CALCIUM 9.3 8.7*   GFR: Estimated Creatinine Clearance: 43.6 mL/min (by C-G formula based on SCr of 0.68 mg/dL). Liver Function Tests: Recent Labs  Lab 10/24/20 1045  AST 47*  ALT 31  ALKPHOS 79  BILITOT 0.9  PROT 8.4*  ALBUMIN 4.2   No results for input(s): LIPASE, AMYLASE in the last 168 hours. Recent Labs  Lab 10/26/20 1401  AMMONIA 25   Coagulation Profile: Recent Labs  Lab 10/24/20 1801  INR 1.0   Cardiac Enzymes: No results for input(s): CKTOTAL, CKMB, CKMBINDEX, TROPONINI in the last 168 hours. BNP (last 3 results) No results for input(s): PROBNP in the last 8760 hours. HbA1C: No results for input(s): HGBA1C in the last 72 hours. CBG: No results for input(s): GLUCAP in  the last 168 hours. Lipid Profile: No results for input(s): CHOL, HDL, LDLCALC, TRIG, CHOLHDL, LDLDIRECT in the last 72 hours. Thyroid Function Tests: No results for input(s): TSH, T4TOTAL, FREET4, T3FREE, THYROIDAB in the last 72 hours. Anemia Panel: No results for input(s): VITAMINB12, FOLATE, FERRITIN, TIBC, IRON, RETICCTPCT in the last 72 hours. Sepsis Labs: No results for input(s): PROCALCITON, LATICACIDVEN in the last 168 hours.  Recent Results (from the past 240 hour(s))  Respiratory Panel by RT PCR (Flu A&B, Covid) - Nasopharyngeal Swab     Status: None   Collection Time: 10/24/20 11:23 AM   Specimen: Nasopharyngeal Swab  Result Value Ref Range Status   SARS Coronavirus  2 by RT PCR NEGATIVE NEGATIVE Final    Comment: (NOTE) SARS-CoV-2 target nucleic acids are NOT DETECTED.  The SARS-CoV-2 RNA is generally detectable in upper respiratoy specimens during the acute phase of infection. The lowest concentration of SARS-CoV-2 viral copies this assay can detect is 131 copies/mL. A negative result does not preclude SARS-Cov-2 infection and should not be used as the sole basis for treatment or other patient management decisions. A negative result may occur with  improper specimen collection/handling, submission of specimen other than nasopharyngeal swab, presence of viral mutation(s) within the areas targeted by this assay, and inadequate number of viral copies (<131 copies/mL). A negative result must be combined with clinical observations, patient history, and epidemiological information. The expected result is Negative.  Fact Sheet for Patients:  PinkCheek.be  Fact Sheet for Healthcare Providers:  GravelBags.it  This test is no t yet approved or cleared by the [place] FDA and  has been authorized for detection and/or diagnosis of SARS-CoV-2 by FDA under an Emergency Use Authorization (EUA). This EUA will remain  in effect (meaning this test can be used) for the duration of the COVID-19 declaration under Section 564(b)(1) of the Act, 21 U.S.C. section 360bbb-3(b)(1), unless the authorization is terminated or revoked sooner.     Influenza A by PCR NEGATIVE NEGATIVE Final   Influenza B by PCR NEGATIVE NEGATIVE Final    Comment: (NOTE) The Xpert Xpress SARS-CoV-2/FLU/RSV assay is intended as an aid in  the diagnosis of influenza from Nasopharyngeal swab specimens and  should not be used as a sole basis for treatment. Nasal washings and  aspirates are unacceptable for Xpert Xpress SARS-CoV-2/FLU/RSV  testing.  Fact Sheet for Patients: PinkCheek.be  Fact Sheet  for Healthcare Providers: GravelBags.it  This test is not yet approved or cleared by the [place] FDA and  has been authorized for detection and/or diagnosis of SARS-CoV-2 by  FDA under an Emergency Use Authorization (EUA). This EUA will remain  in effect (meaning this test can be used) for the duration of the  Covid-19 declaration under Section 564(b)(1) of the Act, 21  U.S.C. section 360bbb-3(b)(1), unless the authorization is  terminated or revoked. Performed at [facility], [address] [place], [place] [address]          Radiology Studies: No results found.      Scheduled Meds: . aspirin EC  81 mg Oral Daily  . donepezil  10 mg Oral QHS  . enoxaparin (LOVENOX) injection  30 mg Subcutaneous Q24H  . memantine  10 mg Oral BID  . mirtazapine  7.5 mg Oral QHS  . vitamin B-12  1,000 mcg Oral Daily   Continuous Infusions: . thiamine injection 500 mg (10/29/20 0836)    Assessment & Plan:   Active Problems:   Change in  mental status   Debility   Failure to thrive (child)   Dementia without behavioral disturbance ([place])   1.Altered MS-Change in mental status-suspect multifactorial including progression of dementia, failure to thrive and superimposed by chronic and worsening debility, versus vitamin deficiency versus possible infectious etiology versus thyroid -Need to r/o reversible causes -CT head without contrast was negative for evidence of acute intracranial abnormality, similar chronic microvascular ischemic disease and generalized atrophy 11/10-vitamin B12 normal levels, TSH normal, UA negative With EF 65 to 70%, normal wall motion.  LVH, grade 1 diastolic dysfunction , Influenza negative Toxicology negative, alcohol less than 10 SPL found mild oropharyngeal dysphagia likely related to change in AMS, mild aspiration risk.  Placed on dysphagia 2 diet with thin liquids.  Please see note Once improving, will need PT/OT  consult Palliative consult following 11/12- MRI -no acute intracranial abnormality EEG completed-reveals moderate diffuse encephalopathy, nonspecific etiology.  No seizures or epileptiform discharges seen. Per neurology since she is then thiamine deficiency can be a consideration  Per neuro she is behaving as if she has some acute physiologic stressor which can cause worsening, though no clear etiology has been identified.  She is sent and thiamine deficiency is a consideration, though encephalopathy is her only symptom, continue repletion.Expect either gradual improvement towards her previous level of functioning, or this could be a new baseline 11/14-MS waxes and wanes. Per neurology a slight prolonged EEG might be helpful, but if this is negative then further management will consist of supportive care Continue 500 mg thiamine 3 times daily for total of 9 doses 2-hour EEG tomorrow Continue B12 p.o. 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  Outpatient management      DVT prophylaxis: Lovenox Code Status: Full Family Communication: None at bedside  status is: inpatient  Patient is inpatient due to unsafe discharge  Dispo: The patient is from: SNF              Anticipated d/c is to: SNF              Anticipated d/c date is: 2 days              Patient currently medically stable, needs SNF placement pending.  But planning to also do EEG in a.m.              LOS: 4 days   Time spent: 35 minutes with more than 50% on [place], MD [facility] Pager [phone]  [phone]  If 7PM-7AM, please contact night-coverage [email or web address] Password [email or web address] 10/29/2020, 2:25 PM

## 2020-10-29 NOTE — Progress Notes (Signed)
Subjective: She continues to be aphasic but awake  Exam: Vitals:   10/29/20 0438 10/29/20 0814  BP: 118/66 129/69  Pulse: 68 (!) 58  Resp: 16 17  Temp: 98.5 F (36.9 C) 97.7 F (36.5 C)  SpO2: 96% 98%   Gen: In bed, NAD Resp: non-labored breathing, no acute distress Abd: soft, nt  Neuro: MS: She is awake, she appears to squeeze hands and release them to command, but does not stick out her tongue or close eyes to command.  She nods and shakes her head today, but does not have any verbal output. appropriate. CN: PERRL, EOMI Motor: MAEW Sensory: responds to nox stim x 4.   Pertinent Labs: Ammonia 25 EEG - negative MRI - negative No leukocytosis UA - negative B12 [id number]  Impression: 74 yo F with a history of dementia including a progressive aphasia component who appears to have gotten worse over the past week. She is behaving as if she has had some acute physiological stressor which can cause worsening, though no clear etiology has been identified. She is thin, and thiamine deficiency is a consideration, though encephaloapthy is her only symptom, I will continue repletion. It is possible that a cause will not be identified, in which case I would expect either gradual improvement towards her previous level of functioning, or this could be a new baseline.  She is having some waxing/waning, and therefore I think a slightly prolonged EEG might be helpful, but if this is negative then further management would consist of supportive care.  Recommendations: 1) continue 500mg  TID for a total of nine doses 2) 2-hour EEG tomorrow 3) if this is negative, then care will be for supportive moving forward.  [name], MD [facility] [phone]  If 7pm- 7am, please page neurology on call as listed in [place].

## 2020-10-30 DIAGNOSIS — R5381 Other malaise: Secondary | ICD-10-CM | POA: Diagnosis not present

## 2020-10-30 DIAGNOSIS — R4182 Altered mental status, unspecified: Secondary | ICD-10-CM | POA: Diagnosis not present

## 2020-10-30 DIAGNOSIS — E041 Nontoxic single thyroid nodule: Secondary | ICD-10-CM | POA: Diagnosis not present

## 2020-10-30 DIAGNOSIS — R410 Disorientation, unspecified: Secondary | ICD-10-CM | POA: Diagnosis not present

## 2020-10-30 LAB — BASIC METABOLIC PANEL
Anion gap: 8 (ref 5–15)
BUN: 21 mg/dL (ref 8–23)
CO2: 29 mmol/L (ref 22–32)
Calcium: 8.8 mg/dL — ABNORMAL LOW (ref 8.9–10.3)
Chloride: 102 mmol/L (ref 98–111)
Creatinine, Ser: 1.12 mg/dL — ABNORMAL HIGH (ref 0.44–1.00)
GFR, Estimated: 52 mL/min — ABNORMAL LOW (ref >60)
Glucose, Bld: 97 mg/dL (ref 70–99)
Potassium: 4.4 mmol/L (ref 3.5–5.1)
Sodium: 139 mmol/L (ref 135–145)

## 2020-10-30 LAB — 25-HYDROXY VITAMIN D LCMS D2+D3
25-Hydroxy, Vitamin D-2: <1 ng/mL
25-Hydroxy, Vitamin D-3: 7.6 ng/mL
25-Hydroxy, Vitamin D: 7.6 ng/mL — ABNORMAL LOW

## 2020-10-30 LAB — MAGNESIUM: Magnesium: 2.2 mg/dL (ref 1.7–2.4)

## 2020-10-30 NOTE — Progress Notes (Signed)
Physical Therapy Treatment Patient Details Name: Carol Beard MRN: [id number] DOB: 11-29-1946 Today's Date: 10/30/2020    History of Present Illness Pt is a 74 y.o. female with medical history significant for single nontoxic thyroid nodule, dementia, presented to the emergency department from nursing home facility for complaints of altered mental status within the last week. Per chart at baseline she is normally confused but able to participate and interact and ambulate.    PT Comments    Patient in bed, sideways, initially alert and able to visually track PT, but over time and with mobility pt fatigued and became more lethargic until resting with eyes closed at end of session. The patient was more limited today due to anxiety and then lethargy. Supine <> sit modA, and poor sitting balance noted due to posterior lean. Pt did endorse fear of falling when asked. Sit <> Stand performed with PT assist at hips, initially totalA but with time and encouragement pt able to participate (PT maintained at least maxA). RN in room alert PT to BM, returned to supine and bed rolling performed minA to address. The patient would benefit from further skilled PT intervention to continue to progress towards goals. Recommendation remains appropriate.     Follow Up Recommendations  SNF     Equipment Recommendations  Other (comment) (TBD at next venue of care)    Recommendations for Other Services       Precautions / Restrictions Precautions Precautions: Fall Restrictions Weight Bearing Restrictions: No    Mobility  Bed Mobility Overal bed mobility: Needs Assistance Bed Mobility: Supine to Sit;Sit to Supine;Rolling Rolling: Min assist   Supine to sit: Mod assist;HOB elevated Sit to supine: Mod assist;HOB elevated   General bed mobility comments: tactile facilitation for all tasks needed  Transfers Overall transfer level: Needs assistance Equipment used: 1 person hand held assist Transfers:  Sit to/from Stand Sit to Stand: Max assist         General transfer comment: Pt initially resistant to sit <> stand with PT assist at hips, with time and encouragement able to stand with maxA, but further mobility held/limited due to incontinence and then pt fatigue  Ambulation/Gait                 Stairs             Wheelchair Mobility    Modified Rankin (Stroke Patients Only)       Balance Overall balance assessment: Needs assistance Sitting-balance support: Feet supported;Bilateral upper extremity supported Sitting balance-Leahy Scale: Poor Sitting balance - Comments: pt with posterior lean throughout sitting today did endorse fear when asked     Standing balance-Leahy Scale: Zero                              Cognition Arousal/Alertness: Lethargic Behavior During Therapy: Flat affect;Anxious Overall Cognitive Status: History of cognitive impairments - at baseline                                 General Comments: Pt with limited verbal communication, will visually track and alert and turn to PT when name called ~30% of the time, less verbal and responsive this session      Exercises      General Comments        Pertinent Vitals/Pain Pain Assessment: No/denies pain    Home Living  Prior Function            PT Goals (current goals can now be found in the care plan section) Progress towards PT goals: Progressing toward goals    Frequency    Min 2X/week      PT Plan Current plan remains appropriate    Co-evaluation              AM-PAC PT "6 Clicks" Mobility   Outcome Measure  Help needed turning from your back to your side while in a flat bed without using bedrails?: A Little Help needed moving from lying on your back to sitting on the side of a flat bed without using bedrails?: A Little Help needed moving to and from a bed to a chair (including a wheelchair)?:  Total Help needed standing up from a chair using your arms (e.g., wheelchair or bedside chair)?: Total Help needed to walk in hospital room?: Total Help needed climbing 3-5 steps with a railing? : Total 6 Click Score: 10    End of Session Equipment Utilized During Treatment: Gait belt Activity Tolerance: Patient limited by fatigue;Patient limited by lethargy Patient left: in bed;with call bell/phone within reach;with bed alarm set Nurse Communication: Mobility status PT Visit Diagnosis: Other abnormalities of gait and mobility (R26.89);Muscle weakness (generalized) (M62.81);Difficulty in walking, not elsewhere classified (R26.2);History of falling (Z91.81)     Time: 4818-5909 PT Time Calculation (min) (ACUTE ONLY): 28 min  Charges:  $Therapeutic Exercise: 23-37 mins                     Lieutenant Diego PT, DPT 4:11 PM,10/30/20

## 2020-10-30 NOTE — Progress Notes (Signed)
CCMD called [phone] to notify that pt had non-sustained v-tach 21-beats. RN assessed pt who is resting. She denies pain/discomfort, and is in no visual distress. RN notified provider. No orders received at this time. Continuing to monitor pt.

## 2020-10-30 NOTE — Progress Notes (Signed)
PT Cancellation Note  Patient Details Name: Carol Beard MRN: [id number] DOB: Sep 30, 1946   Cancelled Treatment:    Reason Eval/Treat Not Completed: Other (comment) (Pt out of room at this time, per RN undergoing EEG. PT to follow up as able.)   Lieutenant Diego PT, DPT 2:15 PM,10/30/20

## 2020-10-30 NOTE — Care Management Important Message (Signed)
Important Message  Patient Details  Name: Carol Beard MRN: [id number] Date of Birth: 1946/04/25   Medicare Important Message Given:  Yes     Juliann Pulse A [name] 10/30/2020, 12:14 PM

## 2020-10-30 NOTE — TOC Progression Note (Signed)
Transition of Care [facility]) - Progression Note    Patient Details  Name: Carol Beard MRN: [id number] Date of Birth: 23-Dec-1945  Transition of Care [facility]) CM/SW [place], RN Phone Number: 10/30/2020, 1:26 PM  Clinical Narrative:   RNCM reached out and spoke with patient's brother [name] to discuss bed options. Available options of [facility], [facility] were discussed along with providing Medicare ratings of these facilities. After discussion [name] decided on [facility]. Facility accepted in hub along with notifying Neoma Laming. RNCM started insurance authorization through [employer] portal.     Expected Discharge Plan: Memory Care Barriers to Discharge: Continued Medical Work up  Expected Discharge Plan and Services Expected Discharge Plan: Memory Care       Living arrangements for the past 2 months: [place] (Memory Care)                                       Social Determinants of Health (SDOH) Interventions    Readmission Risk Interventions No flowsheet data found.

## 2020-10-30 NOTE — Progress Notes (Signed)
eeg-2 hour-done

## 2020-10-30 NOTE — Procedures (Signed)
Patient Name: MARSHE SHRESTHA  MRN: [id number]  Epilepsy Attending: Lora Havens  Referring Physician/Provider: Dr. Kathrynn Speed Date: 10/30/2020 Duration:  2 hour 1 minute  Patient history: 74 year old female with history of dementia and aphasia who presented with worsening mental status. EEG to evaluate for seizures.  Level of alertness: Awake  AEDs during EEG study: None  Technical aspects: This EEG study was done with scalp electrodes positioned according to the 10-20 International system of electrode placement. Electrical activity was acquired at a sampling rate of 500Hz  and reviewed with a high frequency filter of 70Hz  and a low frequency filter of 1Hz . EEG data were recorded continuously and digitally stored.   Description: The posterior dominant rhythm consists of 8 Hz activity of moderate voltage (25-35 uV) seen predominantly in posterior head regions, symmetric and reactive to eye opening and eye closing. EEG showed continuous generalized 5 to 6 Hz theta as well as intermittent generalized 2 to 3 Hz delta slowing. Physiologic photic driving was not seen during photic stimulation.  Hyperventilation was not performed.     ABNORMALITY - Continuous slow, generalized  IMPRESSION: This study is suggestive of mild diffuse encephalopathy, nonspecific etiology. No seizures or epileptiform discharges were seen throughout the recording.  EEG appears to be improving compared to previous study on 10/26/2020.  Dorance Spink [name]

## 2020-10-30 NOTE — Progress Notes (Signed)
ID: 74 yo F with a history of dementia including a progressive aphasia component who appears to have gotten worse over the past week. Question of possible acute stressor which has been difficult to identify vs. disease progression  Subjective: She continues to be aphasic but awake  Exam: Vitals:   10/30/20 0752 10/30/20 1145  BP: 125/72 124/70  Pulse: 62 75  Resp: 16 16  Temp: 98.9 F (37.2 C) 99 F (37.2 C)  SpO2:  97%   Gen: In bed, NAD Resp: non-labored breathing, no acute distress Abd: soft, nt  Neuro:  MS: She is awake, interactive with her brother, smiles at him, states simple things like "I'm feeling f-f-fine" but is unable to state his name CN: EOMI, face symmetric, tongue midline Motor: MAE grossly symmetrically   Pertinent Labs: Ammonia 25 EEG - negative 11/11 for seizures 2 hour EEG 11/15 improving diffuse encephalopathy still no epileptogenic activity (personally reviewed)  MRI - negative No leukocytosis UA - negative B12 - 283; on repletion PO   Prolonged EEG still with encephalopathic findings, no evidence of epileptogenic activity, overall improving from prior.   Impression: She is behaving as if she has had some acute physiological stressor which can cause worsening, though no clear etiology has been identified. Of note, she was listed as being on Depakote outpatient but level was not checked; she has improved in the setting of holding this medication. Called lab; this can only be added on w/in 48 hrs and would not expect checking a level now to be useful given half-life. Would continue to hold on discharge, and if it is resumed, level should be monitored to confirm she is not having any toxicity from this medication. Subclinical B12 deficiency on background of dementia may also be playing a role.   Discussed importance of careful resumption of Depakote only if needed, with close monitoring of levels with her brother at bedside, who identified himself as her  guardian.   Recommendations: - Continue to hold Depakote on discharge and as toxicity may have contributed to her presentation  - Outpatient follow-up if depakote needs to be resumed with consideration of dose reduction and close monitoring of depakote level if it's resumed.  - Please continue oral B12 and thiamine on discharge (low risk, high potential benefit)  Lesleigh Noe MD-PhD [facility] [phone]  [facility] coverage for [facility] is from 8 AM to 4 AM in-house and 4 PM to 8 PM by telephone/video. 8 PM to 8 AM emergent questions or overnight urgent questions should be addressed to Teleneurology On-call or [facility] neurohospitalist; contact information can be found on AMION

## 2020-10-30 NOTE — Plan of Care (Signed)
In to see patient. Patient is not in the room at this time. No family present. Called brother [name]'s home phone number unsuccessfully. Also called his cell phone, and left a message with palliative phone number upon hearing the VM "this is [name]'s voicemail, what's up?"

## 2020-10-30 NOTE — Progress Notes (Signed)
PROGRESS NOTE    Carol Beard  HOZ:[id number] DOB: [birth year] DOA: 10/24/2020 PCP: Patient, No Pcp Per    Brief Narrative:  Carol Beard is a 74 y.o. female with medical history significant for single nontoxic thyroid nodule, dementia, presented to the emergency department from nursing home facility for complaints of altered mental status within the last week. Per ED provider note, patient fell against the wall and hit her head against the wall about a week ago and then hit the floor.  They did not send her to the emergency department because she was okay afterward.  In the past week she was treated with antibiotics for presumed UTI.  on admission day, when staff checked on her they found that she was too weak to get out of bed, not interactive, and very confused.  This is apparently different from her baseline as she is normally confused but able to participate and interact and ambulate.  11/11-minimal communication . Pt reports yes /no to some questions 11/12- nonverbal this am.  11/13- eyes more open, more communicative , per nsg ate breakfast. 11/14- sleepy, keeps eyes closed during my exam early this am.  No overnight issues 11/15-patient more alert and awake today.  Tries to communicate however has aphasia.  SNF pending due to case management trying to get a hold of brother unfortunately has been unsuccessful.  2-hour EEG completed today On telemetry patient with sinus tach or bursts of SVT, asymptomatic  Consultants:   Neurology  Procedures: CT head  Antimicrobials:       Subjective: Tries to answer yes no to my questions, more awake today  Objective: Vitals:   10/30/20 0014 10/30/20 0436 10/30/20 0752 10/30/20 1145  BP: 125/64 120/60 125/72 124/70  Pulse: 86 67 62 75  Resp:  18 16 16   Temp: 98.6 F (37 C) 98.3 F (36.8 C) 98.9 F (37.2 C) 99 F (37.2 C)  TempSrc: Oral Oral  Oral  SpO2: 95% 97%  97%  Weight:      Height:        Intake/Output Summary  (Last 24 hours) at 10/30/2020 1447 Last data filed at 10/30/2020 0500 Gross per 24 hour  Intake 220 ml  Output 700 ml  Net -480 ml   Filed Weights   10/24/20 1813  Weight: 44.1 kg    Examination: Calm, comfortable CTA no wheeze rales rhonchi's Regular S1-S2 no murmurs rubs Soft benign positive bowel sounds No edema Mood and affect appropriate in current setting  Data Reviewed: I have personally reviewed following labs and imaging studies  CBC: Recent Labs  Lab 10/24/20 1045 10/25/20 0546  WBC 8.4 7.0  NEUTROABS 6.3  --   HGB 15.3* 12.8  HCT 46.7* 37.8  MCV 101.5* 99.5  PLT 185 704   Basic Metabolic Panel: Recent Labs  Lab 10/24/20 1045 10/25/20 0546 10/30/20 0928  NA 139 141 139  K 4.4 3.9 4.4  CL 99 106 102  CO2 26 24 29   GLUCOSE 105* 95 97  BUN 17 13 21   CREATININE 0.92 0.68 1.12*  CALCIUM 9.3 8.7* 8.8*  MG  --   --  2.2   GFR: Estimated Creatinine Clearance: 31.1 mL/min (A) (by C-G formula based on SCr of 1.12 mg/dL (H)). Liver Function Tests: Recent Labs  Lab 10/24/20 1045  AST 47*  ALT 31  ALKPHOS 79  BILITOT 0.9  PROT 8.4*  ALBUMIN 4.2   No results for input(s): LIPASE, AMYLASE in the last 168 hours.  Recent Labs  Lab 10/26/20 1401  AMMONIA 25   Coagulation Profile: Recent Labs  Lab 10/24/20 1801  INR 1.0   Cardiac Enzymes: No results for input(s): CKTOTAL, CKMB, CKMBINDEX, TROPONINI in the last 168 hours. BNP (last 3 results) No results for input(s): PROBNP in the last 8760 hours. HbA1C: No results for input(s): HGBA1C in the last 72 hours. CBG: No results for input(s): GLUCAP in the last 168 hours. Lipid Profile: No results for input(s): CHOL, HDL, LDLCALC, TRIG, CHOLHDL, LDLDIRECT in the last 72 hours. Thyroid Function Tests: No results for input(s): TSH, T4TOTAL, FREET4, T3FREE, THYROIDAB in the last 72 hours. Anemia Panel: No results for input(s): VITAMINB12, FOLATE, FERRITIN, TIBC, IRON, RETICCTPCT in the last 72  hours. Sepsis Labs: No results for input(s): PROCALCITON, LATICACIDVEN in the last 168 hours.  Recent Results (from the past 240 hour(s))  Respiratory Panel by RT PCR (Flu A&B, Covid) - Nasopharyngeal Swab     Status: None   Collection Time: 10/24/20 11:23 AM   Specimen: Nasopharyngeal Swab  Result Value Ref Range Status   SARS Coronavirus 2 by RT PCR NEGATIVE NEGATIVE Final    Comment: (NOTE) SARS-CoV-2 target nucleic acids are NOT DETECTED.  The SARS-CoV-2 RNA is generally detectable in upper respiratoy specimens during the acute phase of infection. The lowest concentration of SARS-CoV-2 viral copies this assay can detect is 131 copies/mL. A negative result does not preclude SARS-Cov-2 infection and should not be used as the sole basis for treatment or other patient management decisions. A negative result may occur with  improper specimen collection/handling, submission of specimen other than nasopharyngeal swab, presence of viral mutation(s) within the areas targeted by this assay, and inadequate number of viral copies (<131 copies/mL). A negative result must be combined with clinical observations, patient history, and epidemiological information. The expected result is Negative.  Fact Sheet for Patients:  PinkCheek.be  Fact Sheet for Healthcare Providers:  GravelBags.it  This test is no t yet approved or cleared by the [place] FDA and  has been authorized for detection and/or diagnosis of SARS-CoV-2 by FDA under an Emergency Use Authorization (EUA). This EUA will remain  in effect (meaning this test can be used) for the duration of the COVID-19 declaration under Section 564(b)(1) of the Act, 21 U.S.C. section 360bbb-3(b)(1), unless the authorization is terminated or revoked sooner.     Influenza A by PCR NEGATIVE NEGATIVE Final   Influenza B by PCR NEGATIVE NEGATIVE Final    Comment: (NOTE) The Xpert Xpress  SARS-CoV-2/FLU/RSV assay is intended as an aid in  the diagnosis of influenza from Nasopharyngeal swab specimens and  should not be used as a sole basis for treatment. Nasal washings and  aspirates are unacceptable for Xpert Xpress SARS-CoV-2/FLU/RSV  testing.  Fact Sheet for Patients: PinkCheek.be  Fact Sheet for Healthcare Providers: GravelBags.it  This test is not yet approved or cleared by the [place] FDA and  has been authorized for detection and/or diagnosis of SARS-CoV-2 by  FDA under an Emergency Use Authorization (EUA). This EUA will remain  in effect (meaning this test can be used) for the duration of the  Covid-19 declaration under Section 564(b)(1) of the Act, 21  U.S.C. section 360bbb-3(b)(1), unless the authorization is  terminated or revoked. Performed at [facility], [address], [place],  [address]          Radiology Studies: No results found.      Scheduled Meds: . aspirin EC  81  mg Oral Daily  . donepezil  10 mg Oral QHS  . enoxaparin (LOVENOX) injection  30 mg Subcutaneous Q24H  . memantine  10 mg Oral BID  . mirtazapine  7.5 mg Oral QHS  . vitamin B-12  1,000 mcg Oral Daily   Continuous Infusions:   Assessment & Plan:   Active Problems:   Change in mental status   Debility   Failure to thrive (child)   Dementia without behavioral disturbance ([place])   1.Altered MS-Change in mental status-suspect multifactorial including progression of dementia, failure to thrive and superimposed by chronic and worsening debility, versus vitamin deficiency versus possible infectious etiology versus thyroid -Need to r/o reversible causes -CT head without contrast was negative for evidence of acute intracranial abnormality, similar chronic microvascular ischemic disease and generalized atrophy 11/10-vitamin B12 normal levels, TSH normal, UA negative With EF 65 to 70%, normal  wall motion.  LVH, grade 1 diastolic dysfunction , Influenza negative Toxicology negative, alcohol less than 10 SPL found mild oropharyngeal dysphagia likely related to change in AMS, mild aspiration risk.  Placed on dysphagia 2 diet with thin liquids.  Please see note Once improving, will need PT/OT consult Palliative consult following 11/12- MRI -no acute intracranial abnormality EEG completed-reveals moderate diffuse encephalopathy, nonspecific etiology.  No seizures or epileptiform discharges seen. Per neurology since she is then thiamine deficiency can be a consideration  Per neuro she is behaving as if she has some acute physiologic stressor which can cause worsening, though no clear etiology has been identified.  She is sent and thiamine deficiency is a consideration, though encephalopathy is her only symptom, continue repletion.Expect either gradual improvement towards her previous level of functioning, or this could be a new baseline 11/15-MS waxes and wanes.  Today she is more awake and tries to be more communicative however she has aphasia Completed 2-hour EEG, results pending We will follow up with Continue Continue B12 p.o. 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  Also SNF pending and trying to get hold of brother by case management to discuss this.             LOS: 5 days   Time spent: 35 minutes with more than 50% on [place], MD [facility] Pager [phone]  If 7PM-7AM, please contact night-coverage [email or web address] Password [facility] 10/30/2020, 2:47 PM

## 2020-10-31 DIAGNOSIS — R634 Abnormal weight loss: Secondary | ICD-10-CM | POA: Diagnosis not present

## 2020-10-31 DIAGNOSIS — G309 Alzheimer's disease, unspecified: Secondary | ICD-10-CM | POA: Diagnosis not present

## 2020-10-31 DIAGNOSIS — Z515 Encounter for palliative care: Secondary | ICD-10-CM | POA: Diagnosis not present

## 2020-10-31 DIAGNOSIS — R1310 Dysphagia, unspecified: Secondary | ICD-10-CM | POA: Diagnosis not present

## 2020-10-31 DIAGNOSIS — F0390 Unspecified dementia without behavioral disturbance: Secondary | ICD-10-CM | POA: Diagnosis not present

## 2020-10-31 DIAGNOSIS — R1312 Dysphagia, oropharyngeal phase: Secondary | ICD-10-CM | POA: Diagnosis not present

## 2020-10-31 DIAGNOSIS — R410 Disorientation, unspecified: Secondary | ICD-10-CM | POA: Diagnosis not present

## 2020-10-31 DIAGNOSIS — R4701 Aphasia: Secondary | ICD-10-CM | POA: Diagnosis not present

## 2020-10-31 DIAGNOSIS — R279 Unspecified lack of coordination: Secondary | ICD-10-CM | POA: Diagnosis not present

## 2020-10-31 DIAGNOSIS — Z741 Need for assistance with personal care: Secondary | ICD-10-CM | POA: Diagnosis not present

## 2020-10-31 DIAGNOSIS — R5381 Other malaise: Secondary | ICD-10-CM | POA: Diagnosis not present

## 2020-10-31 DIAGNOSIS — G9341 Metabolic encephalopathy: Secondary | ICD-10-CM | POA: Diagnosis not present

## 2020-10-31 DIAGNOSIS — M6281 Muscle weakness (generalized): Secondary | ICD-10-CM | POA: Diagnosis not present

## 2020-10-31 DIAGNOSIS — E041 Nontoxic single thyroid nodule: Secondary | ICD-10-CM | POA: Diagnosis not present

## 2020-10-31 DIAGNOSIS — D649 Anemia, unspecified: Secondary | ICD-10-CM | POA: Diagnosis not present

## 2020-10-31 LAB — SARS CORONAVIRUS 2 BY RT PCR (HOSPITAL ORDER, PERFORMED IN ~~LOC~~ HOSPITAL LAB): SARS Coronavirus 2: NEGATIVE

## 2020-10-31 MED ORDER — CYANOCOBALAMIN 1000 MCG PO TABS
1000.0000 ug | ORAL_TABLET | Freq: Every day | ORAL | Status: DC
Start: 2020-11-01 — End: 2022-06-14

## 2020-10-31 NOTE — Plan of Care (Signed)
  Problem: Education: Goal: Knowledge of General Education information will improve Description: Including pain rating scale, medication(s)/side effects and non-pharmacologic comfort measures 10/31/2020 1643 by Trula Slade, RN Outcome: Adequate for Discharge 10/31/2020 1039 by Trula Slade, RN Outcome: Not Progressing   Problem: Health Behavior/Discharge Planning: Goal: Ability to manage health-related needs will improve 10/31/2020 1643 by Trula Slade, RN Outcome: Adequate for Discharge 10/31/2020 1039 by Trula Slade, RN Outcome: Not Progressing   Problem: Clinical Measurements: Goal: Ability to maintain clinical measurements within normal limits will improve 10/31/2020 1643 by Trula Slade, RN Outcome: Adequate for Discharge 10/31/2020 1039 by Trula Slade, RN Outcome: Progressing Goal: Will remain free from infection 10/31/2020 1643 by Trula Slade, RN Outcome: Adequate for Discharge 10/31/2020 1039 by Trula Slade, RN Outcome: Progressing Goal: Diagnostic test results will improve 10/31/2020 1643 by Trula Slade, RN Outcome: Adequate for Discharge 10/31/2020 1039 by Trula Slade, RN Outcome: Progressing Goal: Respiratory complications will improve 10/31/2020 1643 by Trula Slade, RN Outcome: Adequate for Discharge 10/31/2020 1039 by Trula Slade, RN Outcome: Progressing Goal: Cardiovascular complication will be avoided 10/31/2020 1643 by Trula Slade, RN Outcome: Adequate for Discharge 10/31/2020 1039 by Trula Slade, RN Outcome: Progressing   Problem: Activity: Goal: Risk for activity intolerance will decrease 10/31/2020 1643 by Trula Slade, RN Outcome: Adequate for Discharge 10/31/2020 1039 by Trula Slade, RN Outcome: Not Progressing   Problem: Nutrition: Goal: Adequate nutrition will be maintained 10/31/2020 1643 by Trula Slade, RN Outcome: Adequate for  Discharge 10/31/2020 1039 by Trula Slade, RN Outcome: Progressing   Problem: Coping: Goal: Level of anxiety will decrease 10/31/2020 1643 by Trula Slade, RN Outcome: Adequate for Discharge 10/31/2020 1039 by Trula Slade, RN Outcome: Not Progressing   Problem: Pain Managment: Goal: General experience of comfort will improve 10/31/2020 1643 by Trula Slade, RN Outcome: Adequate for Discharge 10/31/2020 1039 by Trula Slade, RN Outcome: Progressing   Problem: Skin Integrity: Goal: Risk for impaired skin integrity will decrease 10/31/2020 1643 by Trula Slade, RN Outcome: Adequate for Discharge 10/31/2020 1039 by Trula Slade, RN Outcome: Progressing   Problem: Urinary Elimination: Goal: Signs and symptoms of infection will decrease Outcome: Adequate for Discharge

## 2020-10-31 NOTE — Discharge Summary (Addendum)
Carol Beard:[id number] DOB: [birth year] DOA: 10/24/2020  PCP: Patient, No Pcp Per  Admit date: 10/24/2020 Discharge date: 10/31/2020  Admitted From: Memory care facility Disposition: SNF  Recommendations for Outpatient Follow-up:  1. Follow up with PCP in 1 week 2. Please obtain BMP/CBC in one week 3. Neurology in 1 week 4. Patient to be followed by [facility]     Discharge Condition:Stable CODE STATUS: Full Diet recommendation: Dysphagia 2   Brief/Interim Summary: Per admission H&P Carol Beard is a 74 y.o. female with medical history significant for single nontoxic thyroid nodule, dementia, presented to the emergency department from nursing home facility for complaints of altered mental status. patient had fallen against the wall and hit her head against the wall about a week prior to admission and then hit the floor.  They did not send her to the emergency department because she was okay afterward.    She was treated for UTI prior to her admission.  On the day of the admission, staff had checked on her and they found that she was too weak to get out of bed, not interactive, and very confused.  This was apparently different from her baseline as she is normally confused but able to participate and interact and ambulate.  1.Altered MS-Change in mental status-suspect multifactorial including progression of dementia, failure to thrive and superimposed by chronic and worsening debility, versus vitamin deficiency versus possible infectious etiology versus thyroid -Need to r/o reversible causes -CT head without contrast was negative for evidence of acute intracranial abnormality, similar chronic microvascular ischemic disease and generalized atrophy 11/10-vitamin B12 normal levels, TSH normal, UA negative With EF 65 to 70%, normal wall motion.  LVH, grade 1 diastolic dysfunction , Influenza negative Toxicology negative, alcohol less than 10 SPL  found mild oropharyngeal dysphagia likely related to change in AMS, mild aspiration risk.  Placed on dysphagia 2 diet with thin liquids.  Please see note Once improving, will need PT/OT consult Palliative consult following 11/12- MRI -no acute intracranial abnormality EEG completed-reveals moderate diffuse encephalopathy, nonspecific etiology.  No seizures or epileptiform discharges seen. Per neurology since she is then thiamine deficiency can be a consideration  Per neuro she is behaving as if she has some acute physiologic stressor which can cause worsening, though no clear etiology has been identified. Thiamine deficiency is a consideration, though encephalopathy is her only symptom, continue repletion.Expect either gradual improvement towards her previous level of functioning, or this could be a new baseline. Her mental status waxes and wanes.  She completed a 2hour EEG 11/14- This study is suggestive of mild diffuse encephalopathy, nonspecific etiology.No seizures or epileptiform discharges were seen throughout the recording. EEG appears to be improving compared to previous study on 10/26/2020. 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  Continue donepezil and memantine  Follows neurology as outpatient in 1 week   4.History of single thyroid nodule- TSH normal Outpatient management    Discharge Diagnoses:  Active Problems:   Change in mental status   Debility   Failure to thrive (child)   Dementia without behavioral disturbance ([place])  Discharge Instructions   Allergies  as of 10/31/2020      Reactions   Penicillins       Medication List    STOP taking these medications   ciprofloxacin 250 MG tablet Commonly known as: CIPRO   divalproex 250 MG DR tablet Commonly known as: DEPAKOTE   senna 8.6 MG Tabs tablet Commonly known as: SENOKOT     TAKE these medications   aspirin EC 81 MG tablet Take 81 mg by mouth daily.   cyanocobalamin 1000 MCG tablet Take 1 tablet (1,000 mcg total) by mouth daily. Start taking on: November 01, 2020   donepezil 10 MG tablet Commonly known as: ARICEPT Take 1 tablet by mouth daily.   memantine 10 MG tablet Commonly known as: NAMENDA Take 10 mg by mouth 2 (two) times daily.   mirtazapine 15 MG tablet Commonly known as: REMERON Take 0.5 tablets (7.5 mg total) by mouth at bedtime.   REFRESH OP Apply to eye.       Contact information for follow-up providers    Gayland Curry Sharrie Rothman, PA-C Follow up in 1 week(s).   Specialty: Neurology Contact information: [place]  [id number] [phone]            Contact information for after-discharge care    Destination    [facility] .   Service: Skilled Nursing Contact information: [address] [place] [phone]                 Allergies  Allergen Reactions  . Penicillins     Consultations:  Neurology, palliative care   Procedures/Studies: EEG  Result Date: 10/30/2020 Lora Havens, MD     10/30/2020  4:00 PM Patient Name: Carol Beard MRN: [id number] Epilepsy Attending: Lora Havens Referring Physician/Provider: Dr. Kathrynn Speed Date: 10/30/2020 Duration:  2 hour 1 minute  Patient history: 74 year old female with history of dementia and aphasia who presented with worsening mental status. EEG to evaluate for seizures.  Level of alertness: Awake  AEDs during EEG study: None  Technical aspects: This EEG study was done with scalp  electrodes positioned according to the 10-20 International system of electrode placement. Electrical activity was acquired at a sampling rate of 500Hz  and reviewed with a high frequency filter of 70Hz  and a low frequency filter of 1Hz . EEG data were recorded continuously and digitally stored.  Description: The posterior dominant rhythm consists of 8 Hz activity of moderate voltage (25-35 uV) seen predominantly in posterior head regions, symmetric and reactive to eye opening and eye closing. EEG showed continuous generalized 5 to 6 Hz theta as well as intermittent generalized 2 to 3 Hz delta slowing. Physiologic photic driving was not seen during photic stimulation.  Hyperventilation was not performed.    ABNORMALITY - Continuous slow, generalized  IMPRESSION: This study is suggestive of mild diffuse encephalopathy, nonspecific etiology. No seizures or epileptiform discharges were seen throughout the recording. EEG appears to be improving compared to previous study on 10/26/2020.  Lora Havens   EEG  Result Date: 10/26/2020 Lora Havens, MD     10/26/2020  2:16 PM Patient Name: Carol Beard MRN: [id number] Epilepsy Attending: Lora Havens Referring Physician/Provider: Dr. Kathrynn Speed Date: 10/26/2020 Duration: 32.58 minutes Patient history: 74 year old female with history of dementia and aphasia who presented with worsening mental status. EEG to evaluate for seizures. Level of alertness: Awake AEDs during EEG study: None Technical aspects: This  EEG study was done with scalp electrodes positioned according to the 10-20 International system of electrode placement. Electrical activity was acquired at a sampling rate of 500Hz  and reviewed with a high frequency filter of 70Hz  and a low frequency filter of 1Hz . EEG data were recorded continuously and digitally stored. Description: No posterior dominant rhythm was seen. EEG showed continuous generalized 3 to 6 Hz theta-delta slowing.  Physiologic photic driving was not seen during photic stimulation.  Hyperventilation was not performed.   ABNORMALITY -Continuous slow, generalized IMPRESSION: This study is suggestive of moderate diffuse encephalopathy, nonspecific etiology. No seizures or epileptiform discharges were seen throughout the recording. Lora Havens   DG Abd 1 View  Result Date: 10/24/2020 CLINICAL DATA:  Confusion and weakness. EXAM: ABDOMEN - 1 VIEW COMPARISON:  11/26/2018 FINDINGS: A moderate amount of stool is noted in the left colon. No dilated loops of bowel are seen to suggest obstruction. Aortic atherosclerosis is noted. No acute osseous abnormality is identified. IMPRESSION: Nonobstructed bowel gas pattern. Electronically Signed   By: Logan Bores M.D.   On: 10/24/2020 13:52   CT HEAD WO CONTRAST  Result Date: 10/24/2020 CLINICAL DATA:  Altered mental status with trauma. EXAM: CT HEAD WITHOUT CONTRAST CT CERVICAL SPINE WITHOUT CONTRAST TECHNIQUE: Multidetector CT imaging of the head and cervical spine was performed following the standard protocol without intravenous contrast. Multiplanar CT image reconstructions of the cervical spine were also generated. COMPARISON:  None. CT head April 10, 2020. FINDINGS: CT HEAD FINDINGS Brain: No evidence of acute large vascular territory infarction, hemorrhage, hydrocephalus, extra-axial collection or mass lesion/mass effect. Similar patchy white matter hypoattenuation, most likely related to chronic microvascular ischemic disease. Similar diffuse cerebral atrophy with ex vacuo ventricular dilation. Vascular: Calcific atherosclerosis. Skull: No acute fracture. Sinuses/Orbits: No acute findings. Other: No mastoid effusions. CT CERVICAL SPINE FINDINGS Alignment: Broad levocurvature.  No substantial subluxation. Skull base and vertebrae: No acute fracture. Vertebral body heights are maintained. No primary bone lesion or focal pathologic process. Soft tissues and spinal canal: No  prevertebral fluid or swelling. No visible canal hematoma. Disc levels: Mild multilevel facet degenerative change. No significant bony canal stenosis. Upper chest: Negative. Other: Calcific atherosclerosis of the carotids. IMPRESSION: CT head: 1. No evidence of acute intracranial abnormality. 2. Similar chronic microvascular ischemic disease and generalized atrophy. CT cervical spine: 1. No evidence of acute fracture or traumatic malalignment. Electronically Signed   By: Margaretha Sheffield MD   On: 10/24/2020 11:23   CT CERVICAL SPINE WO CONTRAST  Result Date: 10/24/2020 CLINICAL DATA:  Altered mental status with trauma. EXAM: CT HEAD WITHOUT CONTRAST CT CERVICAL SPINE WITHOUT CONTRAST TECHNIQUE: Multidetector CT imaging of the head and cervical spine was performed following the standard protocol without intravenous contrast. Multiplanar CT image reconstructions of the cervical spine were also generated. COMPARISON:  None. CT head April 10, 2020. FINDINGS: CT HEAD FINDINGS Brain: No evidence of acute large vascular territory infarction, hemorrhage, hydrocephalus, extra-axial collection or mass lesion/mass effect. Similar patchy white matter hypoattenuation, most likely related to chronic microvascular ischemic disease. Similar diffuse cerebral atrophy with ex vacuo ventricular dilation. Vascular: Calcific atherosclerosis. Skull: No acute fracture. Sinuses/Orbits: No acute findings. Other: No mastoid effusions. CT CERVICAL SPINE FINDINGS Alignment: Broad levocurvature.  No substantial subluxation. Skull base and vertebrae: No acute fracture. Vertebral body heights are maintained. No primary bone lesion or focal pathologic process. Soft tissues and spinal canal: No prevertebral fluid or swelling. No visible canal hematoma. Disc levels: Mild multilevel facet degenerative  change. No significant bony canal stenosis. Upper chest: Negative. Other: Calcific atherosclerosis of the carotids. IMPRESSION: CT head: 1. No  evidence of acute intracranial abnormality. 2. Similar chronic microvascular ischemic disease and generalized atrophy. CT cervical spine: 1. No evidence of acute fracture or traumatic malalignment. Electronically Signed   By: Margaretha Sheffield MD   On: 10/24/2020 11:23   MR BRAIN WO CONTRAST  Result Date: 10/26/2020 CLINICAL DATA:  Stroke follow-up EXAM: MRI HEAD WITHOUT CONTRAST TECHNIQUE: Multiplanar, multiecho pulse sequences of the brain and surrounding structures were obtained without intravenous contrast. COMPARISON:  09/11/2016 FINDINGS: Brain: No acute infarct, acute hemorrhage or extra-axial collection. Early confluent hyperintense T2-weighted signal of the periventricular and deep white matter. There is generalized atrophy without lobar predilection. Bifrontal hemosiderin deposition. Normal midline structures. Vascular: Normal flow voids. Skull and upper cervical spine: Normal marrow signal. Sinuses/Orbits: Left mastoid effusion. Normal orbits. Nasopharynx is clear. Other: None IMPRESSION: 1. No acute intracranial abnormality. 2. Generalized atrophy and findings of chronic microvascular disease. 3. Left mastoid effusion. Electronically Signed   By: Ulyses Jarred M.D.   On: 10/26/2020 23:25   DG Chest Port 1 View  Result Date: 10/24/2020 CLINICAL DATA:  Mental status change.  Fall 1 week ago. EXAM: PORTABLE CHEST 1 VIEW COMPARISON:  04/10/2020 FINDINGS: Cardiac enlargement without heart failure. Lungs clear without infiltrate or effusion. Mild apical scarring bilaterally. IMPRESSION: No active disease. Electronically Signed   By: Franchot Gallo M.D.   On: 10/24/2020 13:51   DG Humerus Left  Result Date: 10/24/2020 CLINICAL DATA:  Fall.  Left upper arm pain. EXAM: LEFT HUMERUS - 2+ VIEW COMPARISON:  None. FINDINGS: There is no evidence of fracture or other focal bone lesions. Soft tissues are unremarkable. IMPRESSION: Negative. Electronically Signed   By: Margaretha Sheffield MD   On: 10/24/2020  11:29   ECHOCARDIOGRAM COMPLETE  Result Date: 10/25/2020    ECHOCARDIOGRAM REPORT   Patient Name:   JARYIAH MEHLMAN Date of Exam: 10/24/2020 Medical Rec #:  [id number]         Height:       62.0 in Accession #:    [id number]        Weight:       97.2 lb Date of Birth:  12-Mar-1946         BSA:          1.407 m Patient Age:    59 years          BP:           151/76 mmHg Patient Gender: F                 HR:           70 bpm. Exam Location:  [facility] Procedure: 2D Echo, Cardiac Doppler and Color Doppler Indications:     Elevated troponin  History:         Patient has prior history of Echocardiogram examinations, most                  recent 07/06/2013.  Sonographer:     Wilford Sports Rodgers-Jones Referring Phys:  [id number] AMY N COX Diagnosing Phys: Bartholome Bill MD IMPRESSIONS  1. Left ventricular ejection fraction, by estimation, is 65 to 70%. The left ventricle has normal function. The left ventricle has no regional wall motion abnormalities. There is moderate left ventricular hypertrophy. Left ventricular diastolic parameters are consistent with Grade I diastolic dysfunction (impaired relaxation).  2. Right ventricular systolic function  is normal. The right ventricular size is normal.  3. The mitral valve is grossly normal. Trivial mitral valve regurgitation.  4. The aortic valve is grossly normal. Aortic valve regurgitation is trivial. FINDINGS  Left Ventricle: Left ventricular ejection fraction, by estimation, is 65 to 70%. The left ventricle has normal function. The left ventricle has no regional wall motion abnormalities. The left ventricular internal cavity size was normal in size. There is  moderate left ventricular hypertrophy. Left ventricular diastolic parameters are consistent with Grade I diastolic dysfunction (impaired relaxation). Right Ventricle: The right ventricular size is normal. No increase in right ventricular wall thickness. Right ventricular systolic function is normal. Left Atrium: Left atrial size  was normal in size. Right Atrium: Right atrial size was normal in size. Pericardium: There is no evidence of pericardial effusion. Mitral Valve: The mitral valve is grossly normal. Trivial mitral valve regurgitation. Tricuspid Valve: The tricuspid valve is grossly normal. Tricuspid valve regurgitation is mild. Aortic Valve: The aortic valve is grossly normal. Aortic valve regurgitation is trivial. Pulmonic Valve: The pulmonic valve was not well visualized. Pulmonic valve regurgitation is trivial. Aorta: The aortic root is normal in size and structure. IAS/Shunts: The interatrial septum was not assessed.  LEFT VENTRICLE PLAX 2D LVIDd:         3.39 cm Diastology LVIDs:         2.04 cm LV e' medial:    5.77 cm/s LV PW:         0.80 cm LV E/e' medial:  11.0 LV IVS:        0.62 cm LV e' lateral:   7.40 cm/s                        LV E/e' lateral: 8.6  RIGHT VENTRICLE             IVC RV Basal diam:  3.53 cm     IVC diam: 0.99 cm RV S prime:     20.00 cm/s TAPSE (M-mode): 2.2 cm LEFT ATRIUM             Index       RIGHT ATRIUM          Index LA diam:        2.50 cm 1.78 cm/m  RA Area:     7.05 cm LA Vol (A2C):   16.9 ml 12.01 ml/m RA Volume:   12.50 ml 8.88 ml/m LA Vol (A4C):   18.0 ml 12.79 ml/m LA Biplane Vol: 17.7 ml 12.58 ml/m   AORTA Ao Root diam: 3.30 cm MITRAL VALVE               TRICUSPID VALVE MV Area (PHT): 2.17 cm    TR Peak grad:   22.7 mmHg MV Decel Time: 349 msec    TR Vmax:        238.00 cm/s MV E velocity: 63.40 cm/s MV A velocity: 88.30 cm/s MV E/A ratio:  0.72 Bartholome Bill MD Electronically signed by Bartholome Bill MD Signature Date/Time: 10/25/2020/7:38:10 AM    Final        Subjective: Awake today, no change from yesterday  Discharge Exam: Vitals:   10/31/20 0738 10/31/20 1206  BP: 124/73 122/70  Pulse: (!) 57 (!) 58  Resp: 15 15  Temp: 97.8 F (36.6 C) (!) 97.5 F (36.4 C)  SpO2: 100% 99%   Vitals:   10/30/20 2259 10/31/20 0353 10/31/20 0738 10/31/20 1206  BP: 113/70 124/74  124/73 122/70  Pulse: 66 (!) 59 (!) 57 (!) 58  Resp: 14 14 15 15   Temp: 98.9 F (37.2 C) 98 F (36.7 C) 97.8 F (36.6 C) (!) 97.5 F (36.4 C)  TempSrc:   Oral Oral  SpO2: 97% 98% 100% 99%  Weight:      Height:        General: Pt is alert, awake, not in acute distress Cardiovascular: RRR, S1/S2 +, no rubs, no gallops Respiratory: CTA bilaterally, no wheezing, no rhonchi Abdominal: Soft, NT, ND, bowel sounds + Extremities: no edema, no cyanosis    The results of significant diagnostics from this hospitalization (including imaging, microbiology, ancillary and laboratory) are listed below for reference.     Microbiology: Recent Results (from the past 240 hour(s))  Respiratory Panel by RT PCR (Flu A&B, Covid) - Nasopharyngeal Swab     Status: None   Collection Time: 10/24/20 11:23 AM   Specimen: Nasopharyngeal Swab  Result Value Ref Range Status   SARS Coronavirus 2 by RT PCR NEGATIVE NEGATIVE Final    Comment: (NOTE) SARS-CoV-2 target nucleic acids are NOT DETECTED.  The SARS-CoV-2 RNA is generally detectable in upper respiratoy specimens during the acute phase of infection. The lowest concentration of SARS-CoV-2 viral copies this assay can detect is 131 copies/mL. A negative result does not preclude SARS-Cov-2 infection and should not be used as the sole basis for treatment or other patient management decisions. A negative result may occur with  improper specimen collection/handling, submission of specimen other than nasopharyngeal swab, presence of viral mutation(s) within the areas targeted by this assay, and inadequate number of viral copies (<131 copies/mL). A negative result must be combined with clinical observations, patient history, and epidemiological information. The expected result is Negative.  Fact Sheet for Patients:  PinkCheek.be  Fact Sheet for Healthcare Providers:  GravelBags.it  This test is  no t yet approved or cleared by the [place] FDA and  has been authorized for detection and/or diagnosis of SARS-CoV-2 by FDA under an Emergency Use Authorization (EUA). This EUA will remain  in effect (meaning this test can be used) for the duration of the COVID-19 declaration under Section 564(b)(1) of the Act, 21 U.S.C. section 360bbb-3(b)(1), unless the authorization is terminated or revoked sooner.     Influenza A by PCR NEGATIVE NEGATIVE Final   Influenza B by PCR NEGATIVE NEGATIVE Final    Comment: (NOTE) The Xpert Xpress SARS-CoV-2/FLU/RSV assay is intended as an aid in  the diagnosis of influenza from Nasopharyngeal swab specimens and  should not be used as a sole basis for treatment. Nasal washings and  aspirates are unacceptable for Xpert Xpress SARS-CoV-2/FLU/RSV  testing.  Fact Sheet for Patients: PinkCheek.be  Fact Sheet for Healthcare Providers: GravelBags.it  This test is not yet approved or cleared by the [place] FDA and  has been authorized for detection and/or diagnosis of SARS-CoV-2 by  FDA under an Emergency Use Authorization (EUA). This EUA will remain  in effect (meaning this test can be used) for the duration of the  Covid-19 declaration under Section 564(b)(1) of the Act, 21  U.S.C. section 360bbb-3(b)(1), unless the authorization is  terminated or revoked. Performed at [facility], [address] [place], [place] [id number]      Labs: BNP (last 3 results) No results for input(s): BNP in the last 8760 hours. Basic Metabolic Panel: Recent Labs  Lab 10/25/20 0546 10/30/20 0928  NA 141 139  K 3.9 4.4  CL  106 102  CO2 24 29  GLUCOSE 95 97  BUN 13 21  CREATININE 0.68 1.12*  CALCIUM 8.7* 8.8*  MG  --  2.2   Liver Function Tests: No results for input(s): AST, ALT, ALKPHOS, BILITOT, PROT, ALBUMIN in the last 168 hours. No results for input(s): LIPASE, AMYLASE in the  last 168 hours. Recent Labs  Lab 10/26/20 1401  AMMONIA 25   CBC: Recent Labs  Lab 10/25/20 0546  WBC 7.0  HGB 12.8  HCT 37.8  MCV 99.5  PLT 181   Cardiac Enzymes: No results for input(s): CKTOTAL, CKMB, CKMBINDEX, TROPONINI in the last 168 hours. BNP: Invalid input(s): POCBNP CBG: No results for input(s): GLUCAP in the last 168 hours. D-Dimer No results for input(s): DDIMER in the last 72 hours. Hgb A1c No results for input(s): HGBA1C in the last 72 hours. Lipid Profile No results for input(s): CHOL, HDL, LDLCALC, TRIG, CHOLHDL, LDLDIRECT in the last 72 hours. Thyroid function studies No results for input(s): TSH, T4TOTAL, T3FREE, THYROIDAB in the last 72 hours.  Invalid input(s): FREET3 Anemia work up No results for input(s): VITAMINB12, FOLATE, FERRITIN, TIBC, IRON, RETICCTPCT in the last 72 hours. Urinalysis    Component Value Date/Time   COLORURINE YELLOW (A) 10/24/2020 1028   APPEARANCEUR CLEAR (A) 10/24/2020 1028   LABSPEC 1.013 10/24/2020 1028   PHURINE 7.0 10/24/2020 1028   GLUCOSEU NEGATIVE 10/24/2020 1028   GLUCOSEU NEGATIVE 06/23/2013 0939   HGBUR NEGATIVE 10/24/2020 1028   BILIRUBINUR NEGATIVE 10/24/2020 1028   BILIRUBINUR neg 09/07/2013 1046   KETONESUR 5 (A) 10/24/2020 1028   PROTEINUR NEGATIVE 10/24/2020 1028   UROBILINOGEN 0.2 09/07/2013 1046   UROBILINOGEN 1.0 06/23/2013 0939   NITRITE NEGATIVE 10/24/2020 1028   LEUKOCYTESUR NEGATIVE 10/24/2020 1028   Sepsis Labs Invalid input(s): PROCALCITONIN,  WBC,  LACTICIDVEN Microbiology Recent Results (from the past 240 hour(s))  Respiratory Panel by RT PCR (Flu A&B, Covid) - Nasopharyngeal Swab     Status: None   Collection Time: 10/24/20 11:23 AM   Specimen: Nasopharyngeal Swab  Result Value Ref Range Status   SARS Coronavirus 2 by RT PCR NEGATIVE NEGATIVE Final    Comment: (NOTE) SARS-CoV-2 target nucleic acids are NOT DETECTED.  The SARS-CoV-2 RNA is generally detectable in upper  respiratoy specimens during the acute phase of infection. The lowest concentration of SARS-CoV-2 viral copies this assay can detect is 131 copies/mL. A negative result does not preclude SARS-Cov-2 infection and should not be used as the sole basis for treatment or other patient management decisions. A negative result may occur with  improper specimen collection/handling, submission of specimen other than nasopharyngeal swab, presence of viral mutation(s) within the areas targeted by this assay, and inadequate number of viral copies (<131 copies/mL). A negative result must be combined with clinical observations, patient history, and epidemiological information. The expected result is Negative.  Fact Sheet for Patients:  PinkCheek.be  Fact Sheet for Healthcare Providers:  GravelBags.it  This test is no t yet approved or cleared by the [place] FDA and  has been authorized for detection and/or diagnosis of SARS-CoV-2 by FDA under an Emergency Use Authorization (EUA). This EUA will remain  in effect (meaning this test can be used) for the duration of the COVID-19 declaration under Section 564(b)(1) of the Act, 21 U.S.C. section 360bbb-3(b)(1), unless the authorization is terminated or revoked sooner.     Influenza A by PCR NEGATIVE NEGATIVE Final   Influenza B by PCR NEGATIVE NEGATIVE Final  Comment: (NOTE) The Xpert Xpress SARS-CoV-2/FLU/RSV assay is intended as an aid in  the diagnosis of influenza from Nasopharyngeal swab specimens and  should not be used as a sole basis for treatment. Nasal washings and  aspirates are unacceptable for Xpert Xpress SARS-CoV-2/FLU/RSV  testing.  Fact Sheet for Patients: PinkCheek.be  Fact Sheet for Healthcare Providers: GravelBags.it  This test is not yet approved or cleared by the [place] FDA and  has been  authorized for detection and/or diagnosis of SARS-CoV-2 by  FDA under an Emergency Use Authorization (EUA). This EUA will remain  in effect (meaning this test can be used) for the duration of the  Covid-19 declaration under Section 564(b)(1) of the Act, 21  U.S.C. section 360bbb-3(b)(1), unless the authorization is  terminated or revoked. Performed at [facility], [address] [place], [place] [address]      Time coordinating discharge: Over 30 minutes  SIGNED:   Nolberto Hanlon, MD  [facility] 10/31/2020, 12:26 PM Pager   If 7PM-7AM, please contact night-coverage [email or web address] Password [email or web address]

## 2020-10-31 NOTE — Progress Notes (Signed)
[name] Liaison note: New referral for [facility] outpatient Palliative to follow at [facility] received from [facility]. Plan is for discharge today. Patient information given to referral. Thank you for this referral,. Flo Shanks BSN, RN, [facility] [phone]

## 2020-10-31 NOTE — Plan of Care (Signed)
  Problem: Education: Goal: Knowledge of General Education information will improve Description: Including pain rating scale, medication(s)/side effects and non-pharmacologic comfort measures Outcome: Not Progressing   Problem: Health Behavior/Discharge Planning: Goal: Ability to manage health-related needs will improve Outcome: Not Progressing   Problem: Clinical Measurements: Goal: Ability to maintain clinical measurements within normal limits will improve Outcome: Progressing Goal: Will remain free from infection Outcome: Progressing Goal: Diagnostic test results will improve Outcome: Progressing Goal: Respiratory complications will improve Outcome: Progressing Goal: Cardiovascular complication will be avoided Outcome: Progressing   Problem: Activity: Goal: Risk for activity intolerance will decrease Outcome: Not Progressing   Problem: Nutrition: Goal: Adequate nutrition will be maintained Outcome: Progressing   Problem: Coping: Goal: Level of anxiety will decrease Outcome: Not Progressing   Problem: Pain Managment: Goal: General experience of comfort will improve Outcome: Progressing   Problem: Safety: Goal: Ability to remain free from injury will improve Outcome: Progressing  Pt is confused and not able to progress in all goals

## 2020-10-31 NOTE — TOC Progression Note (Signed)
Transition of Care [facility]) - Progression Note    Patient Details  Name: Carol Beard MRN: [id number] Date of Birth: [birth year]  Transition of Care [facility]) CM/SW [place], RN Phone Number: 10/31/2020, 7:42 AM  Clinical Narrative:   RNCM uploaded updated PT note per [facility] CM request. Expect insurance authorization later this morning or early afternoon.     Expected Discharge Plan: Memory Care Barriers to Discharge: Continued Medical Work up  Expected Discharge Plan and Services Expected Discharge Plan: Memory Care       Living arrangements for the past 2 months: [place] (Memory Care)                                       Social Determinants of Health (SDOH) Interventions    Readmission Risk Interventions No flowsheet data found.

## 2020-11-02 LAB — VITAMIN B1: Vitamin B1 (Thiamine): 154.4 nmol/L (ref 66.5–200.0)

## 2020-11-07 DIAGNOSIS — G309 Alzheimer's disease, unspecified: Secondary | ICD-10-CM | POA: Diagnosis not present

## 2020-11-07 DIAGNOSIS — G9341 Metabolic encephalopathy: Secondary | ICD-10-CM | POA: Diagnosis not present

## 2020-11-07 DIAGNOSIS — R1310 Dysphagia, unspecified: Secondary | ICD-10-CM | POA: Diagnosis not present

## 2020-11-07 DIAGNOSIS — Z741 Need for assistance with personal care: Secondary | ICD-10-CM | POA: Diagnosis not present

## 2020-11-08 ENCOUNTER — Non-Acute Institutional Stay: Payer: Medicare Other | Admitting: Primary Care

## 2020-11-08 ENCOUNTER — Other Ambulatory Visit: Payer: Self-pay

## 2020-11-08 DIAGNOSIS — F0390 Unspecified dementia without behavioral disturbance: Secondary | ICD-10-CM

## 2020-11-08 DIAGNOSIS — R634 Abnormal weight loss: Secondary | ICD-10-CM | POA: Diagnosis not present

## 2020-11-08 DIAGNOSIS — Z515 Encounter for palliative care: Secondary | ICD-10-CM

## 2020-11-08 NOTE — Progress Notes (Signed)
[facility] Consult Note Telephone: [phone]  Fax: [phone]     Date of encounter: 11/08/20 PATIENT NAME: Carol Beard [id number] [phone] (home)  DOB: [birth year] MRN: [id number]  PRIMARY CARE PROVIDER:    Gennie Alma, MD,  [place] [address] [place]:   Carol Beard, [place],  [place] [address] [phone]  RESPONSIBLE PARTY:   Extended Emergency Contact Information Primary Emergency Contact: Carol, Beard Home Phone: [phone] Mobile Phone: [phone] Relation: Brother  I met face to face with patient in facility. Palliative Care was asked to follow this patient by consultation request of Carol Alma, MD to help address advance care planning and goals of care. This is the initial visit.   ASSESSMENT AND RECOMMENDATIONS:  1. Advance Care Planning/Goals of Care: Goals include to maximize quality of life and symptom management. Patient has full code directives. Call to 2 numbers to POA brother Carol Beard, no answer, messages left. Will f/u for discussion of ACP.  2. Symptom Management:   Spoke with PT/OT staff who endorses good progress with ambulation and adl performance. She was able to ambulate with help, feed self and do some grooming. She has lived with friend for some  Years but likely will need an alternate disposition due to both their poor healths.   3. Follow up Palliative Care Visit: Palliative care will continue to follow for goals of care clarification and symptom management. Return 4 weeks or prn.  4. Family /Caregiver/Community Supports: Brother is POA, has lived with roommate for some time, now at [facility]  5. Cognitive / Functional decline: PPA, alert,difficult word finding, needs help with most adls, dependent in all iadls.  I spent 35 minutes providing this consultation,  from 1045 to  1120. More than 50% of the time in this consultation was spent coordinating communication.   CODE STATUS: FULL  PPS: 40%  HOSPICE ELIGIBILITY/DIAGNOSIS: TBD  Subjective:  CHIEF COMPLAINT: debility  HISTORY OF PRESENT ILLNESS:  Carol Beard is a 74 y.o. year old female  with debility, PPA, self care deficits. Recent hospitalization for altered mental status, now at [facility] for rehab.   We are asked to consult around complex medical management and ACP.    History obtained from review of EMR, discussion with primary team, and  interview with family, caregiver  and/or [name]. 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   Breast nodule 12/24/2015   Headache 12/24/2015   Health care maintenance 06/18/2015   Environmental allergies 06/18/2015   Toenail fungus 07/31/2014   Weight loss 03/28/2014   History of colonic polyps 10/28/2013   Knee pain 09/09/2013   Skin lesion 09/09/2013   Memory change 06/18/2013   Hand numbness 06/18/2013   Fullness of breast 06/16/2013   Thyroid nodule 01/13/2013   PAST MEDICAL HISTORY:  Active Ambulatory Problems    Diagnosis Date Noted   Thyroid nodule 01/13/2013   Fullness of breast 06/16/2013   Memory change 06/18/2013   Hand numbness 06/18/2013   Knee pain 09/09/2013   Skin lesion 09/09/2013   History of colonic  polyps 10/28/2013   Weight loss 03/28/2014   Toenail fungus 07/31/2014   Health care maintenance 06/18/2015   Environmental allergies 06/18/2015   Breast nodule 12/24/2015   Headache 12/24/2015   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   Osteoporosis 09/11/2018    Anxiety 06/13/2019   Change in mental status 10/24/2020   Debility 10/24/2020   Failure to thrive (child) 10/24/2020   Dementia without behavioral disturbance ([place]) 10/24/2020   Resolved Ambulatory Problems    Diagnosis Date Noted   No Resolved Ambulatory Problems   Past Medical History:  Diagnosis Date   Aphasia    SOCIAL HX:  Social History   Tobacco Use   Smoking status: Never Smoker   Smokeless tobacco: Never Used  Substance Use Topics   Alcohol use: No    Alcohol/week: 0.0 standard drinks    Comment: occasional wine with dinner   FAMILY HX:  Family History  Problem Relation Age of Onset   Cancer Mother        Colon   Stroke Father    Breast cancer Neg Hx     ALLERGIES:  Allergies  Allergen Reactions   Penicillins      PERTINENT MEDICATIONS:  Outpatient Encounter Medications as of 11/08/2020  Medication Sig   aspirin EC 81 MG tablet Take 81 mg by mouth daily.    donepezil (ARICEPT) 10 MG tablet Take 1 tablet by mouth daily.   memantine (NAMENDA) 10 MG tablet Take 10 mg by mouth 2 (two) times daily.   Polyvinyl Alcohol-Povidone (REFRESH OP) Apply to eye.   vitamin B-12 1000 MCG tablet Take 1 tablet (1,000 mcg total) by mouth daily.   [DISCONTINUED] mirtazapine (REMERON) 15 MG tablet Take 0.5 tablets (7.5 mg total) by mouth at bedtime.   No facility-administered encounter medications on file as of 11/08/2020.    Objective: ROS/Staff report  General: NAD ENMT: denies dysphagia Pulmonary: denies  cough, denies increased SB  Abdomen: endorses fair to good appetite MSK:  endorses ROM limitations, no falls reported Skin: denies rashes or wounds Neurological: endorses weakness, denies pain, denies insomnia Psych: Endorses positive mood Heme/lymph/immuno: denies bruises, abnormal bleeding  Physical Exam: Current and past weights: 96 lbs, stable Constitutional: NAD General :frail appearing, thin EYES: anicteric sclera,lids intact, no  discharge  ENMT: intact hearing,oral mucous membranes moist, dentition intact CV: no LE edema Pulmonary:  no increased work of breathing, no cough, no audible wheezes, room air Abdomen: intake 75%,  no ascites GU: deferred MSK: mod sacropenia, decreased ROM in all extremities, no contractures of LE,ambulatory with assist Skin: warm and dry, no rashes or wounds on visible skin Neuro: general Weakness, severe cognitive impairment, aphasia Psych: non -anxious affect, alert Hem/lymph/immuno/ no widespread bruising   Thank you for the opportunity to participate in the care of Ms. Kerrigan.  The palliative care team will continue to follow. Please call our office at [phone] if we can be of additional assistance.  Jason Coop, NP , DNP, MPH, AGPCNP-BC, ACHPN  COVID-19 PATIENT SCREENING TOOL  Person answering questions: ____________staff______ _____   1.  Is the patient or any family member in the home showing any signs or symptoms regarding respiratory infection?               Person with Symptom- __________NA_________________  a. Fever  Yes___ No___          ___________________  b. Shortness of breath                                                    Yes___ No___          ___________________ c. Cough/congestion                                       Yes___  No___         ___________________ d. Body aches/pains                                                         Yes___ No___        ____________________ e. Gastrointestinal symptoms (diarrhea, nausea)           Yes___ No___        ____________________  2. Within the past 14 days, has anyone living in the home had any contact with someone with or under investigation for COVID-19?    Yes___ No_X_   Person __________________

## 2020-11-15 ENCOUNTER — Telehealth: Payer: Self-pay | Admitting: Primary Care

## 2020-11-15 NOTE — Telephone Encounter (Signed)
I spoke with Carol Beard,  patients brother and POA. We discussed her upcoming care planning as he must make a decision about assisted or long-term care. We discussed the various levels of care and  the requirements, mainly based on functionality. He discussed some of her and his concerns about placement, stating that this is not ultimately what she would want but understanding that her disease is progressive and she will not be more functional later.We discussed her moving to [facility] as a care decision for the future. I will read visit her in a week or two in at that time Mr. Varble and I will discuss her advance care planning more detail.

## 2020-11-17 DIAGNOSIS — M6281 Muscle weakness (generalized): Secondary | ICD-10-CM | POA: Diagnosis not present

## 2020-11-17 DIAGNOSIS — R1312 Dysphagia, oropharyngeal phase: Secondary | ICD-10-CM | POA: Diagnosis not present

## 2020-11-17 DIAGNOSIS — R488 Other symbolic dysfunctions: Secondary | ICD-10-CM | POA: Diagnosis not present

## 2020-11-18 DIAGNOSIS — R300 Dysuria: Secondary | ICD-10-CM | POA: Diagnosis not present

## 2020-11-20 DIAGNOSIS — G9341 Metabolic encephalopathy: Secondary | ICD-10-CM | POA: Diagnosis not present

## 2020-11-20 DIAGNOSIS — R488 Other symbolic dysfunctions: Secondary | ICD-10-CM | POA: Diagnosis not present

## 2020-11-20 DIAGNOSIS — M6281 Muscle weakness (generalized): Secondary | ICD-10-CM | POA: Diagnosis not present

## 2020-11-20 DIAGNOSIS — R1312 Dysphagia, oropharyngeal phase: Secondary | ICD-10-CM | POA: Diagnosis not present

## 2020-11-20 DIAGNOSIS — R300 Dysuria: Secondary | ICD-10-CM | POA: Diagnosis not present

## 2020-11-21 DIAGNOSIS — R488 Other symbolic dysfunctions: Secondary | ICD-10-CM | POA: Diagnosis not present

## 2020-11-21 DIAGNOSIS — R1312 Dysphagia, oropharyngeal phase: Secondary | ICD-10-CM | POA: Diagnosis not present

## 2020-11-21 DIAGNOSIS — M6281 Muscle weakness (generalized): Secondary | ICD-10-CM | POA: Diagnosis not present

## 2020-11-22 DIAGNOSIS — M6281 Muscle weakness (generalized): Secondary | ICD-10-CM | POA: Diagnosis not present

## 2020-11-22 DIAGNOSIS — R1312 Dysphagia, oropharyngeal phase: Secondary | ICD-10-CM | POA: Diagnosis not present

## 2020-11-22 DIAGNOSIS — R488 Other symbolic dysfunctions: Secondary | ICD-10-CM | POA: Diagnosis not present

## 2020-11-23 DIAGNOSIS — M6281 Muscle weakness (generalized): Secondary | ICD-10-CM | POA: Diagnosis not present

## 2020-11-23 DIAGNOSIS — R488 Other symbolic dysfunctions: Secondary | ICD-10-CM | POA: Diagnosis not present

## 2020-11-23 DIAGNOSIS — R1312 Dysphagia, oropharyngeal phase: Secondary | ICD-10-CM | POA: Diagnosis not present

## 2020-11-23 DIAGNOSIS — R300 Dysuria: Secondary | ICD-10-CM | POA: Diagnosis not present

## 2020-11-24 DIAGNOSIS — R1312 Dysphagia, oropharyngeal phase: Secondary | ICD-10-CM | POA: Diagnosis not present

## 2020-11-24 DIAGNOSIS — M6281 Muscle weakness (generalized): Secondary | ICD-10-CM | POA: Diagnosis not present

## 2020-11-24 DIAGNOSIS — R488 Other symbolic dysfunctions: Secondary | ICD-10-CM | POA: Diagnosis not present

## 2020-11-26 DIAGNOSIS — M6281 Muscle weakness (generalized): Secondary | ICD-10-CM | POA: Diagnosis not present

## 2020-11-26 DIAGNOSIS — R1312 Dysphagia, oropharyngeal phase: Secondary | ICD-10-CM | POA: Diagnosis not present

## 2020-11-26 DIAGNOSIS — R488 Other symbolic dysfunctions: Secondary | ICD-10-CM | POA: Diagnosis not present

## 2020-11-27 DIAGNOSIS — R488 Other symbolic dysfunctions: Secondary | ICD-10-CM | POA: Diagnosis not present

## 2020-11-27 DIAGNOSIS — M6281 Muscle weakness (generalized): Secondary | ICD-10-CM | POA: Diagnosis not present

## 2020-11-27 DIAGNOSIS — R1312 Dysphagia, oropharyngeal phase: Secondary | ICD-10-CM | POA: Diagnosis not present

## 2020-11-28 DIAGNOSIS — R488 Other symbolic dysfunctions: Secondary | ICD-10-CM | POA: Diagnosis not present

## 2020-11-28 DIAGNOSIS — R1312 Dysphagia, oropharyngeal phase: Secondary | ICD-10-CM | POA: Diagnosis not present

## 2020-11-28 DIAGNOSIS — M6281 Muscle weakness (generalized): Secondary | ICD-10-CM | POA: Diagnosis not present

## 2020-11-29 DIAGNOSIS — M6281 Muscle weakness (generalized): Secondary | ICD-10-CM | POA: Diagnosis not present

## 2020-11-29 DIAGNOSIS — R1312 Dysphagia, oropharyngeal phase: Secondary | ICD-10-CM | POA: Diagnosis not present

## 2020-11-29 DIAGNOSIS — R488 Other symbolic dysfunctions: Secondary | ICD-10-CM | POA: Diagnosis not present

## 2020-11-30 DIAGNOSIS — R488 Other symbolic dysfunctions: Secondary | ICD-10-CM | POA: Diagnosis not present

## 2020-11-30 DIAGNOSIS — M6281 Muscle weakness (generalized): Secondary | ICD-10-CM | POA: Diagnosis not present

## 2020-11-30 DIAGNOSIS — R1312 Dysphagia, oropharyngeal phase: Secondary | ICD-10-CM | POA: Diagnosis not present

## 2020-12-01 DIAGNOSIS — M6281 Muscle weakness (generalized): Secondary | ICD-10-CM | POA: Diagnosis not present

## 2020-12-01 DIAGNOSIS — R488 Other symbolic dysfunctions: Secondary | ICD-10-CM | POA: Diagnosis not present

## 2020-12-01 DIAGNOSIS — R1312 Dysphagia, oropharyngeal phase: Secondary | ICD-10-CM | POA: Diagnosis not present

## 2020-12-06 DIAGNOSIS — D649 Anemia, unspecified: Secondary | ICD-10-CM | POA: Diagnosis not present

## 2020-12-07 ENCOUNTER — Non-Acute Institutional Stay: Payer: Medicare Other | Admitting: Primary Care

## 2020-12-07 ENCOUNTER — Other Ambulatory Visit: Payer: Self-pay

## 2020-12-07 DIAGNOSIS — Z515 Encounter for palliative care: Secondary | ICD-10-CM | POA: Diagnosis not present

## 2020-12-07 DIAGNOSIS — R634 Abnormal weight loss: Secondary | ICD-10-CM

## 2020-12-07 DIAGNOSIS — F0391 Unspecified dementia with behavioral disturbance: Secondary | ICD-10-CM

## 2020-12-07 DIAGNOSIS — F0390 Unspecified dementia without behavioral disturbance: Secondary | ICD-10-CM

## 2020-12-07 DIAGNOSIS — F03918 Unspecified dementia, unspecified severity, with other behavioral disturbance: Secondary | ICD-10-CM | POA: Insufficient documentation

## 2020-12-07 NOTE — Progress Notes (Signed)
[facility] Consult Note Telephone: [phone]  Fax: [phone]     Date of encounter: 12/07/20 PATIENT NAME: Carol Beard Carol Beard [id number] [phone] (home)  DOB: Nov 02, 1946 MRN: [id number]  PRIMARY CARE PROVIDER:    Gennie Alma, MD,  [place] [address] [place]:   Carol Beard, [place],  [place] [address] [phone]  RESPONSIBLE PARTY:   Extended Emergency Contact Information Primary Emergency Contact: Carol Beard, Carol Beard Home Phone: [phone] Mobile Phone: [phone] Relation: Brother  I met face to face with patient  In the facility. Palliative Care was asked to follow this patient by consultation request of Carol Alma, MD to help address advance care planning and goals of care. This is a follow up  visit.   ASSESSMENT AND RECOMMENDATIONS:  1. Advance Care Planning/Goals of Care: Goals include to maximize quality of life and symptom management. . I reached out to [employer]. No answer, message left a return call. Full code on record.  2. Symptom Management:   I met with Carol Beard in her nursing home. She is able to speak some but has an expressive progressive dysphasia. She has been having some behavioral disturbances, per staff usually onsets in the mid afternoon. We discussed the concept of sun-downing, Staff reporting had not experienced this before. We discussed behavior as indicating anxiety or pain or possibly both. Staff endorses that patient frowns and gets quite agitated as the afternoon wears on. Would advocate perhaps  2 PM dosing of acetaminophen and pain assessed. She may also need a U/a if behavior is new onset.  On this exam she appears comfortable and not in distress. However she is sitting with her legs over the chair arm as if she's trying to get up to walk. Staff denies any recent falls.   If she is not determined to have pain, a calming agent could be started at 2 pm to help with agitation, eg risperidone or Seroquel.   3. Follow up Palliative Care Visit: Palliative care will continue to follow for goals of care clarification and symptom management. Return 4-6 weeks or prn.  4. Family /Caregiver/Community Supports: Brother Carol Beard is Designer, industrial/product, lives in [place]. Pt resident of LTC.  5. Cognitive / Functional decline: Severe cognitive impairment, primary progressive aphasia. Dependent in all adls and iadls.  I spent 25 minutes providing this consultation,  from 1300 to 1325. More than 50% of the time in this consultation was spent coordinating communication.   CODE STATUS:FULL CODE  PPS: 30%  HOSPICE ELIGIBILITY/DIAGNOSIS: TBD  Subjective:  CHIEF COMPLAINT: agitation  HISTORY OF PRESENT ILLNESS:  Carol Beard is a 74 y.o. year old female  with dementia, new behavioral disturbance,  PPA. Recent hospitalization and came to rehab to regain function. She continues to decline in  Function as she was able to do some PT a month ago and is now more dependent.  We are asked to consult around advance care planning and complex medical decision making.    History obtained from review of EMR, discussion with primary team, and  interview with family, caregiver  and/or Carol Beard. Records reviewed and summarized above.   CURRENT PROBLEM LIST:  Patient Active Problem List   Diagnosis Date Noted  . Dementia with behavioral disturbance ([place]) 12/07/2020  . Change in mental status 10/24/2020  . Debility 10/24/2020  . Failure to thrive (child) 10/24/2020  .  Dementia without behavioral disturbance ([place]) 10/24/2020  . Anxiety 06/13/2019  . Osteoporosis 09/11/2018  . 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 Breast nodule 12/24/2015  . Headache 12/24/2015  . Health care maintenance 06/18/2015  . Environmental allergies 06/18/2015  . Toenail fungus 07/31/2014  . Weight loss  03/28/2014  . History of colonic polyps 10/28/2013  . Knee pain 09/09/2013  . Skin lesion 09/09/2013  . Memory change 06/18/2013  . Hand numbness 06/18/2013  . Fullness of breast 06/16/2013  . Thyroid nodule 01/13/2013   PAST MEDICAL HISTORY:  Active Ambulatory Problems    Diagnosis Date Noted  . Thyroid nodule 01/13/2013  . Fullness of breast 06/16/2013  . Memory change 06/18/2013  . Hand numbness 06/18/2013  . Knee pain 09/09/2013  . Skin lesion 09/09/2013  . History of colonic polyps 10/28/2013  . Weight loss 03/28/2014  . Toenail fungus 07/31/2014  . Health care maintenance 06/18/2015  . Environmental allergies 06/18/2015  . Breast nodule 12/24/2015  . Headache 12/24/2015  . 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 Osteoporosis 09/11/2018  . Anxiety 06/13/2019  . Change in mental status 10/24/2020  . Debility 10/24/2020  . Failure to thrive (child) 10/24/2020  . Dementia without behavioral disturbance ([place]) 10/24/2020  . Dementia with behavioral disturbance ([place]) 12/07/2020   Resolved Ambulatory Problems    Diagnosis Date Noted  . No Resolved Ambulatory Problems   Past Medical History:  Diagnosis Date  . Aphasia    SOCIAL HX:  Social History   Tobacco Use  . Smoking status: Never Smoker  . Smokeless tobacco: Never Used  Substance Use Topics  . Alcohol use: No    Alcohol/week: 0.0 standard drinks    Comment: occasional wine with dinner   FAMILY HX:  Family History  Problem Relation Age of Onset  . Cancer Mother        Colon  . Stroke Father   . Breast cancer Neg Hx        ALLERGIES:  Allergies  Allergen Reactions  . Penicillins      PERTINENT MEDICATIONS:  Outpatient Encounter Medications as of 12/07/2020  Medication Sig  . aspirin EC 81 MG tablet Take 81 mg by mouth daily.   [place] donepezil (ARICEPT) 10 MG tablet Take 1 tablet by mouth daily.  . memantine (NAMENDA) 10 MG tablet Take 10 mg by mouth 2 (two) times daily.  . Polyvinyl  Alcohol-Povidone (REFRESH OP) Apply to eye.  . vitamin B-12 1000 MCG tablet Take 1 tablet (1,000 mcg total) by mouth daily.   No facility-administered encounter medications on file as of 12/07/2020.    Objective: ROS/staff  General: NAD ENMT: denies dysphagia, must be fed Cardiovascular: denies chest pain Pulmonary: denies  cough, denies increased SOB Abdomen: endorses good  appetite, denies  constipation, endorses incontinence of bowel GU: denies dysuria, endorses incontinence of urine MSK:  endorses ROM limitations, fall risk Skin: denies rashes or wounds Neurological: endorses weakness,agitation Psych: Endorses agitated  mood Heme/lymph/immuno: denies bruises, abnormal bleeding  Physical Exam: Current and past weights: 99.2 lbs Constitutional:  NAD General: frail appearing, thin EYES: anicteric sclera,lids intact, no discharge  ENMT: intact hearing,oral mucous membranes moist, dentition intact CV: no LE edema Pulmonary:  no increased work of breathing, no cough, no audible wheezes, room air Abdomen: intake 75%,  no ascites GU: deferred MSK: severe sarcopenia, decreased ROM in all extremities, no contractures of LE, non ambulatory Skin: warm and dry, no rashes or wounds on visible skin Neuro: Generalized  weakness, ++cognitive impairment Psych: non-anxious affect today, A and O x 1 Hem/lymph/immuno: no widespread bruising   Thank you for the opportunity to participate in the care of Carol Beard.  The palliative care team will continue to follow. Please call our office at [phone] if we can be of additional assistance.  Jason Coop, NP , DNP, MPH, AGPCNP-BC, ACHPN  COVID-19 PATIENT SCREENING TOOL  Person answering questions: ____________staff______ _____   1.  Is the patient or any family member in the home showing any signs or symptoms regarding respiratory infection?               Person with Symptom- __________NA_________________  a. Fever                                                                           Yes___ No___          ___________________  b. Shortness of breath                                                    Yes___ No___          ___________________ c. Cough/congestion                                       Yes___  No___         ___________________ d. Body aches/pains                                                         Yes___ No___        ____________________ e. Gastrointestinal symptoms (diarrhea, nausea)           Yes___ No___        ____________________  2. Within the past 14 days, has anyone living in the home had any contact with someone with or under investigation for COVID-19?    Yes___ No_X_   Person __________________

## 2020-12-22 DIAGNOSIS — D519 Vitamin B12 deficiency anemia, unspecified: Secondary | ICD-10-CM | POA: Diagnosis not present

## 2020-12-22 DIAGNOSIS — R1310 Dysphagia, unspecified: Secondary | ICD-10-CM | POA: Diagnosis not present

## 2021-01-11 ENCOUNTER — Other Ambulatory Visit
Admission: RE | Admit: 2021-01-11 | Discharge: 2021-01-11 | Disposition: A | Discharge status: Home / Self Care | Payer: Medicare Other | Source: Other Acute Inpatient Hospital | Attending: General Practice | Admitting: General Practice

## 2021-01-11 DIAGNOSIS — R339 Retention of urine, unspecified: Secondary | ICD-10-CM | POA: Diagnosis not present

## 2021-01-11 DIAGNOSIS — R300 Dysuria: Secondary | ICD-10-CM | POA: Insufficient documentation

## 2021-01-11 LAB — URINALYSIS, COMPLETE (UACMP) WITH MICROSCOPIC
Bilirubin Urine: NEGATIVE
Glucose, UA: NEGATIVE mg/dL
Hgb urine dipstick: NEGATIVE
Ketones, ur: NEGATIVE mg/dL
Leukocytes,Ua: NEGATIVE
Nitrite: NEGATIVE
Protein, ur: NEGATIVE mg/dL
Specific Gravity, Urine: 1.028 (ref 1.005–1.030)
pH: 5 (ref 5.0–8.0)

## 2021-01-12 LAB — URINE CULTURE: Culture: NO GROWTH

## 2021-01-17 ENCOUNTER — Other Ambulatory Visit: Payer: Self-pay

## 2021-01-17 ENCOUNTER — Non-Acute Institutional Stay: Payer: Medicare Other | Admitting: Primary Care

## 2021-01-17 DIAGNOSIS — F0390 Unspecified dementia without behavioral disturbance: Secondary | ICD-10-CM

## 2021-01-17 DIAGNOSIS — Z515 Encounter for palliative care: Secondary | ICD-10-CM

## 2021-01-17 DIAGNOSIS — R634 Abnormal weight loss: Secondary | ICD-10-CM

## 2021-01-17 NOTE — Progress Notes (Signed)
[facility] Consult Note Telephone: [phone]  Fax: [phone]     Date of encounter: 01/17/21 PATIENT NAME: Carol Beard [place] [id number] [phone] (home)  DOB: 05-02-1946 MRN: [id number]  PRIMARY CARE PROVIDER:    Gennie Alma, MD,  [place] [address] [place]:   Gennie Alma, [place],  [place] [address] [phone]  RESPONSIBLE PARTY:   Extended Emergency Contact Information Primary Emergency Contact: Carol Beard Home Phone: [phone] Mobile Phone: [phone] Relation: Brother  I met face to face with patient in facility. Palliative Care was asked to follow this patient by consultation request of Gennie Alma, MD to help address advance care planning and goals of care. This is a follow up  visit.   ASSESSMENT AND RECOMMENDATIONS:   1. Advance Care Planning/Goals of Care: Goals include to maximize quality of life and symptom management. Our advance care planning conversation included a discussion about:     The value and importance of advance care planning   Exploration of personal, cultural or spiritual beliefs that might influence medical decisions   Exploration of goals of care in the event of a sudden injury or illness   Identification and preparation of a healthcare agent - Carol Beard is guardian  Review  of an  advance directive document .  I spoke with power of attorney and guardian, brother Carol Beard. We discussed his upcoming self-report for guardianship and I defined for inclusion my role. He was glad to know that this service was provided. . Advance care directives  were not on her chart and her brother has not yet done a MOST form or other advance care directives. We discussed, and he was familiar with a Living will. I will mail a [employer] Wishes book with MOST form to him for his review and we can  discuss by phone at a later meeting.  I will clarify any questions at that time of discussion.  2. Symptom Management:   Covid :We discussed her Covid resistance and injections. He would like for her to have a booster if she has not already.  It was likely due in Fall 2021 based on initial series.  Urinary Retention: We discussed recent urinary retention. He stated this happened several times before, when she was a resident at assisted living in [place]. He was not sure what treatment ensued but she does not have a urology follow up. We discussed risk and benefit for procedure and work up versus being able to remedy the problem with urinary catheter for several days. Should this issue become increasingly problematic I would recommend a referral to urology.    Prevention services: Discussed Palliative services in the building. Would like also to have dentistry in place. Needs fingernail trim and file, if done via beauty shop.  3. Follow up Palliative Care Visit: Palliative care will continue to follow for goals of care clarification and symptom management. Return 4-6 weeks or prn.  4. Family /Caregiver/Community Supports: Brother is guardian. Lives in [place].  5. Cognitive / Functional decline:  A and O x 1, dependent in all adls, iadls.  I spent 35 minutes providing this consultation,  from 1600 to 1635. More than 50% of the time in this consultation was spent in counseling and care coordination.  CODE STATUS: FULL  PPS: 40%  HOSPICE ELIGIBILITY/DIAGNOSIS: TBD  Subjective:  CHIEF COMPLAINT: debility HISTORY OF PRESENT  ILLNESS:  Carol Beard is a 75 y.o. year old female  with dementia, new behavioral disturbance,  PPA. Placed in LTC after recent decline in health, from ALF. Has dysphagia, dependent in all adls. Dysphasia, unable to make needs known. Has had recent (recurrent) urinary retention Rx with catheter placement x several days with resolution. We are asked to consult  around advance care planning and complex medical decision making.    Review and summarization of old Epic records shows or history from other than patient. Review of case with family member Carol Beard  History obtained from review of EMR, discussion with primary team, and  interview with family, caregiver  and/or Carol Beard. Records reviewed and summarized above.   CURRENT PROBLEM LIST:  Patient Active Problem List   Diagnosis Date Noted  . Dementia with behavioral disturbance ([facility]) 12/07/2020  . Change in mental status 10/24/2020  . Debility 10/24/2020  . Failure to thrive (child) 10/24/2020  . Dementia without behavioral disturbance ([place] [facility] [place]) 10/24/2020  . Anxiety 06/13/2019  . Osteoporosis 09/11/2018  . 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 Breast nodule 12/24/2015  . Headache 12/24/2015  . Health care maintenance 06/18/2015  . Environmental allergies 06/18/2015  . Toenail fungus 07/31/2014  . Weight loss 03/28/2014  . History of colonic polyps 10/28/2013  . Knee pain 09/09/2013  . Skin lesion 09/09/2013  . Memory change 06/18/2013  . Hand numbness 06/18/2013  . Fullness of breast 06/16/2013  . Thyroid nodule 01/13/2013   PAST MEDICAL HISTORY:  Active Ambulatory Problems    Diagnosis Date Noted  . Thyroid nodule 01/13/2013  . Fullness of breast 06/16/2013  . Memory change 06/18/2013  . Hand numbness 06/18/2013  . Knee pain 09/09/2013  . Skin lesion 09/09/2013  . History of colonic polyps 10/28/2013  . Weight loss 03/28/2014  . Toenail fungus 07/31/2014  . Health care maintenance 06/18/2015  . Environmental allergies 06/18/2015  . Breast nodule 12/24/2015  . Headache 12/24/2015  . 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 Osteoporosis 09/11/2018  . Anxiety 06/13/2019  . Change in mental status 10/24/2020  . Debility 10/24/2020  . Failure to thrive (child) 10/24/2020  . Dementia without behavioral disturbance ([place]) 10/24/2020  . Dementia with behavioral  disturbance ([place]) 12/07/2020   Resolved Ambulatory Problems    Diagnosis Date Noted  . No Resolved Ambulatory Problems   Past Medical History:  Diagnosis Date  . Aphasia    SOCIAL HX:  Social History   Tobacco Use  . Smoking status: Never Smoker  . Smokeless tobacco: Never Used  Substance Use Topics  . Alcohol use: No    Alcohol/week: 0.0 standard drinks    Comment: occasional wine with dinner   FAMILY HX:  Family History  Problem Relation Age of Onset  . Cancer Mother        Colon  . Stroke Father   . Breast cancer Neg Hx       ALLERGIES:  Allergies  Allergen Reactions  . Penicillins      PERTINENT MEDICATIONS:  Outpatient Encounter Medications as of 01/17/2021  Medication Sig  . aspirin EC 81 MG tablet Take 81 mg by mouth daily.   Marland Kitchen donepezil (ARICEPT) 10 MG tablet Take 1 tablet by mouth daily.  . memantine (NAMENDA) 10 MG tablet Take 10 mg by mouth 2 (Beard) times daily.  . Polyvinyl Alcohol-Povidone (REFRESH OP) Apply to eye.  . vitamin B-12 1000 MCG tablet Take 1 tablet (1,000 mcg total) by mouth daily.  No facility-administered encounter medications on file as of 01/17/2021.     Objective: ROS/staff  General: NAD ENMT: denies dysphagia, must be fed Cardiovascular: denies chest pain Pulmonary: denies  cough, denies increased SOB Abdomen: endorses good  appetite, denies  constipation, endorses incontinence of bowel GU: denies dysuria, endorses incontinence of urine, recent retention  Now resolved. MSK:  endorses ROM limitations, fall risk Skin: denies rashes or wounds Neurological: endorses weakness +,agitation at times  Psych: Endorses calm  mood Heme/lymph/immuno: denies bruises, abnormal bleeding  Physical Exam: Current and past weights: 100 lbs, stable Constitutional:  NAD General: frail appearing, thin EYES: anicteric sclera,lids intact, no discharge  ENMT: intact hearing,oral mucous membranes dry, dentition intact CV: S1S2, no LE  edema Pulmonary:  Diminished lung sounds, no increased work of breathing, no cough, no audible wheezes, room air Abdomen: intake 75%,  no ascites GU: deferred MSK: severe sarcopenia, decreased ROM in all extremities, no contractures of LE, non- ambulatory Skin: warm and dry, no rashes or wounds on visible skin Neuro: Generalized weakness, ++cognitive impairment, hand tremors bil Psych: non-anxious affect today, A and O x 1, dysphasia Hem/lymph/immuno: no widespread bruising  Thank you for the opportunity to participate in the care of Ms. Scallan.  The palliative care team will continue to follow. Please call our office at [phone] if we can be of additional assistance.  Jason Coop, NP , DNP, MPH, AGPCNP-BC, ACHPN   COVID-19 PATIENT SCREENING TOOL  Person answering questions: _______staff____________   1.  Is the patient or any family member in the home showing any signs or symptoms regarding respiratory infection?                  Person with Symptom  ______________na___________ a. Fever/chills/headache                                                        Yes___ No__X_            b. Shortness of breath                                                            Yes___ No__X_           c. Cough/congestion                                               Yes___  No__X_          d. Muscle/Body aches/pains                                                   Yes___ No__X_         e. Gastrointestinal symptoms (diarrhea,nausea)             Yes___ No__X_         f. Sudden loss of smell or taste  Yes___ No__X_        2. Within the past 10 days, has anyone living in the home had any contact with someone with or under investigation for COVID-19?    Yes___ No__X__   Person __________________

## 2021-02-23 ENCOUNTER — Other Ambulatory Visit: Payer: Self-pay

## 2021-02-23 ENCOUNTER — Non-Acute Institutional Stay: Payer: Medicare Other | Admitting: Primary Care

## 2021-02-23 DIAGNOSIS — Z515 Encounter for palliative care: Secondary | ICD-10-CM

## 2021-02-23 DIAGNOSIS — F0391 Unspecified dementia with behavioral disturbance: Secondary | ICD-10-CM

## 2021-02-23 NOTE — Progress Notes (Signed)
[facility] Consult Note Telephone: [phone]  Fax: [phone]    Date of encounter: 02/23/21 PATIENT NAME: Carol Beard [place] [id number] [phone] (home)  DOB: 04/27/1946 MRN: [id number]  PRIMARY CARE PROVIDER:    Gennie Alma, MD,  [place] [address] [place]:   Gennie Alma, [place],  [place] [address] [phone]  RESPONSIBLE PARTY:   Extended Emergency Contact Information Primary Emergency Contact: Warner Mccreedy Address: [address]          [place], [place] [address] Home Phone: [phone] Mobile Phone: [phone] Relation: Brother  I met face to face with patient  In facility. Palliative Care was asked to follow this patient by consultation request of Gennie Alma, MD to help address advance care planning and goals of care. This is a follow up  visit.   ASSESSMENT AND RECOMMENDATIONS:   1. Advance Care Planning/Goals of Care: Goals include to maximize quality of life and symptom management. Our advance care planning conversation included a discussion about:     The value and importance of advance care planning   Experiences with loved ones who have been seriously ill or have died   Exploration of personal, cultural or spiritual beliefs that might influence medical decisions   Exploration of goals of care in the event of a sudden injury or illness   Identification of a healthcare agent   Review  of an  advance directive document . Spoke with brother [name] about ACP. He states it is difficult to do on behalf of someone else. We discussed MOST form. He has a copy and will return by mail.  2. Symptom Management:   Nutrition: Has been eating some better but must be fed. Has gained a few pounds over last several months.  Agitation: Has been agitated in context of adls. Ativan has been tried, clonazepam  d/c. Today she is asleep after lunch in bed, slightly arouseable but unable to stay awake. Brother states depakote was a bad fit for her in the past and she should not take this again. Patient may benefit from an anti depressant and I would recommend trazodone for behavior as well.  3. Follow up Palliative Care Visit: Palliative care will continue to follow for goals of care clarification and symptom management. Return 6 weeks or prn.  4. Family /Caregiver/Community Supports: Brother [name] is POA. Lives in [place].  5. Cognitive / Functional decline: A and O x  1 at baseline, drowsy today. Dependent in all adls.  I spent 35 minutes providing this consultation,  from 1400 to 1435. More than 50% of the time in this consultation was spent in counseling and care coordination.  CODE STATUS: FULL  PPS: 30%  HOSPICE ELIGIBILITY/DIAGNOSIS: TBD  Subjective:  CHIEF COMPLAINT: agitation  HISTORY OF PRESENT ILLNESS:  Carol Beard is a 75 y.o. year old female  with dementia, agitation at times. Has had lorazepam with good results per staff. Has been refusing care on some occasions, esp around toileting.   We are asked to consult around advance care planning and complex medical decision making.    Review and summarization of old Epic records shows or history from other than patient.   Review of case with family member. Brother [name].  History obtained from review of EMR, discussion with primary team, and  interview with family, caregiver  and/or Ms. Sword. Records reviewed and  summarized above.   CURRENT PROBLEM LIST:  Patient Active Problem List   Diagnosis Date Noted  . Dementia with behavioral disturbance ([place]) 12/07/2020  . Change in mental status 10/24/2020  . Debility 10/24/2020  . Failure to thrive (child) 10/24/2020  . Dementia without behavioral disturbance ([facility]) 10/24/2020  . Anxiety 06/13/2019  . Osteoporosis 09/11/2018  . 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 Breast  nodule 12/24/2015  . Headache 12/24/2015  . Health care maintenance 06/18/2015  . Environmental allergies 06/18/2015  . Toenail fungus 07/31/2014  . Weight loss 03/28/2014  . History of colonic polyps 10/28/2013  . Knee pain 09/09/2013  . Skin lesion 09/09/2013  . Memory change 06/18/2013  . Hand numbness 06/18/2013  . Fullness of breast 06/16/2013  . Thyroid nodule 01/13/2013   PAST MEDICAL HISTORY:  Active Ambulatory Problems    Diagnosis Date Noted  . Thyroid nodule 01/13/2013  . Fullness of breast 06/16/2013  . Memory change 06/18/2013  . Hand numbness 06/18/2013  . Knee pain 09/09/2013  . Skin lesion 09/09/2013  . History of colonic polyps 10/28/2013  . Weight loss 03/28/2014  . Toenail fungus 07/31/2014  . Health care maintenance 06/18/2015  . Environmental allergies 06/18/2015  . Breast nodule 12/24/2015  . Headache 12/24/2015  . 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 Osteoporosis 09/11/2018  . Anxiety 06/13/2019  . Change in mental status 10/24/2020  . Debility 10/24/2020  . Failure to thrive (child) 10/24/2020  . Dementia without behavioral disturbance ([facility]) 10/24/2020  . Dementia with behavioral disturbance ([place]) 12/07/2020   Resolved Ambulatory Problems    Diagnosis Date Noted  . No Resolved Ambulatory Problems   Past Medical History:  Diagnosis Date  . Aphasia    SOCIAL HX:  Social History   Tobacco Use  . Smoking status: Never Smoker  . Smokeless tobacco: Never Used  Substance Use Topics  . Alcohol use: No    Alcohol/week: 0.0 standard drinks    Comment: occasional wine with dinner   FAMILY HX:  Family History  Problem Relation Age of Onset  . Cancer Mother        Colon  . Stroke Father   . Breast cancer Neg Hx       ALLERGIES:  Allergies  Allergen Reactions  . Penicillins      PERTINENT MEDICATIONS:  Outpatient Encounter Medications as of 02/23/2021  Medication Sig  . aspirin EC 81 MG tablet Take 81 mg by mouth daily.    Marland Kitchen donepezil (ARICEPT) 10 MG tablet Take 1 tablet by mouth daily.  . memantine (NAMENDA) 10 MG tablet Take 10 mg by mouth 2 (two) times daily.  . Polyvinyl Alcohol-Povidone (REFRESH OP) Apply to eye.  . vitamin B-12 1000 MCG tablet Take 1 tablet (1,000 mcg total) by mouth daily.   No facility-administered encounter medications on file as of 02/23/2021.    Objective: ROS   General: drowsy ENMT: occ dysphagia Cardiovascular: denies chest pain Pulmonary: denies  cough, denies increased SOB Abdomen: endorses fair appetite, denies constipation, endorses incontinence of bowel GU: denies dysuria, endorses incontinence of urine MSK:  endorses ROM limitations, no falls reported Skin: denies rashes or wounds Neurological: endorses weakness, denies pain, denies insomnia Psych: Endorses positive mood, agitated at times Heme/lymph/immuno: denies bruises, abnormal bleeding  Physical Exam: Current and past weights: 101 lbs, up from 98 lbs in 12/22.  Constitutional: NAD General: frail appearing, thin  EYES: anicteric sclera, lids intact, no discharge  ENMT: intact hearing,oral mucous membranes dry,  dentition intact CV: RRR, no LE edema Pulmonary: no increased work of breathing, no cough, no audible wheezes, room air Abdomen: intake 25-50%, no ascites GU: deferred MSK: moderate sarcopenia, decreased ROM in all extremities, no contractures of LE, non ambulatory Skin: warm and dry, no rashes or wounds on visible skin Neuro: Generalized weakness, ++cognitive impairment, aphasia Psych: drowsy affect, A and O x 1 Hem/lymph/immuno: no widespread bruising   Thank you for the opportunity to participate in the care of Ms. Szafranski.  The palliative care team will continue to follow. Please call our office at [phone] if we can be of additional assistance.  Jason Coop, NP , DNP, MPH, AGPCNP-BC, ACHPN   COVID-19 PATIENT SCREENING TOOL  Person answering questions:  _______staff____________   1.  Is the patient or any family member in the home showing any signs or symptoms regarding respiratory infection?                  Person with Symptom  ______________na___________ a. Fever/chills/headache                                                        Yes___ No__X_            b. Shortness of breath                                                            Yes___ No__X_           c. Cough/congestion                                               Yes___  No__X_          d. Muscle/Body aches/pains                                                   Yes___ No__X_         e. Gastrointestinal symptoms (diarrhea,nausea)             Yes___ No__X_         f. Sudden loss of smell or taste      Yes___ No__X_        2. Within the past 10 days, has anyone living in the home had any contact with someone with or under investigation for COVID-19?    Yes___ No__X__   Person __________________

## 2021-04-11 ENCOUNTER — Other Ambulatory Visit: Payer: Self-pay

## 2021-04-11 ENCOUNTER — Non-Acute Institutional Stay: Payer: Medicare Other | Admitting: Primary Care

## 2021-04-11 DIAGNOSIS — F0391 Unspecified dementia with behavioral disturbance: Secondary | ICD-10-CM

## 2021-04-11 DIAGNOSIS — Z515 Encounter for palliative care: Secondary | ICD-10-CM

## 2021-04-11 NOTE — Progress Notes (Signed)
[facility] Consult Note Telephone: [phone]  Fax: [phone]    Date of encounter: 04/11/21 PATIENT NAME: Carol Beard [id number]   [phone] (home)  DOB: [birth year] MRN: [id number] PRIMARY CARE PROVIDER:    Brayton Mars, MD [address] [place],  [place] [address] [phone]  REFERRING PROVIDER:   Brayton Mars, [place],  [place] [address] [phone]  RESPONSIBLE PARTY:    Contact Information    Name Relation Home Work [address]   [place], [place] Brother [phone]  [phone]       I met face to face with patient in [facility] facility. Palliative Care was asked to follow this patient by consultation request of  Brayton Mars, MD  to address advance care planning and complex medical decision making. This is a follow up visit.                                   ASSESSMENT AND PLAN / RECOMMENDATIONS:   Advance Care Planning/Goals of Care: Goals include to maximize quality of life and symptom management. Our advance care planning conversation included a discussion about:    CODE STATUS: FULL code status   Brother was sent MOST  To review and complete but not yet returned to chart  Symptom Management/Plan:  Discussed recent agitation event with staff. Patient became panicked and ambulated down hallways. Staff was afraid she may fall but she was helped before this happened. PCP has made some medication changes for behavior disturbances.  Today she is pleasant and interactive at her baseline. She is able to echo some small talk. Staff states she is eating about 50% consistently. All meds reviewed. Staff reports interrupted sleep; may need something to improve hours of sleep at hs.  Follow up Palliative Care Visit: Palliative care will continue to follow for complex medical decision making, advance care planning, and clarification of goals. Return 6 weeks or  prn.  I spent 25 minutes providing this consultation. More than 50% of the time in this consultation was spent in counseling and care coordination.  PPS: 30%  HOSPICE ELIGIBILITY/DIAGNOSIS: TBD  Chief Complaint:behavior disturbances  HISTORY OF PRESENT ILLNESS:  Carol Beard is a 75 y.o. year old female  with dementia, aphasia, agitation and anxiety.   History obtained from review of EMR, discussion with primary team, and interview with family, facility staff/caregiver and/or Carol Beard.  I reviewed available labs, medications, imaging, studies and related documents from the EMR.  Records reviewed and summarized above.   ROS/STAFF  General: NAD ENMT: denies dysphagia Cardiovascular: denies chest pain, denies DOE Pulmonary: denies cough, denies increased SOB Abdomen: endorses good/fair appetite, denies constipation, endorses incontinence of bowel GU: denies dysuria, endorses incontinence of urine MSK:  endorses weakness,  no falls reported Skin: denies rashes or wounds Neurological: possible R knee pain, endorses some insomnia, sleeps 5-6 hours. Psych: Endorses mood at baseline today, recent panic/agitation event Heme/lymph/immuno: denies bruises, abnormal bleeding  Physical Exam: Current and past weights:4/22 = 102.4 lbs , gain of 1 lb in 2 months Constitutional: NAD General: frail appearing, thin EYES: anicteric sclera, lids intact, no discharge  ENMT: intact hearing, oral mucous membranes moist, dentition intact CV:  RRR, no LE edema Pulmonary:  no increased work of breathing, no cough, room air Abdomen: intake 100%,  no ascites MSK: severe  sarcopenia, moves all extremities,  ambulatory (recently had panic attack and ran in building) Skin: warm and dry, no rashes or wounds on visible skin Neuro:  ++ generalized weakness,  severe cognitive impairment, aphasia Psych: anxious affect, A and O x 1 Hem/lymph/immuno: no widespread bruising   Thank you for the opportunity  to participate in the care of Carol Beard.  The palliative care team will continue to follow. Please call our office at [phone] if we can be of additional assistance.   Jason Coop, NP , DNP, MPH, AGPCNP-BC, ACHPN  COVID-19 PATIENT SCREENING TOOL Asked and negative response unless otherwise noted:   Have you had symptoms of covid, tested positive or been in contact with someone with symptoms/positive test in the past 5-10 days?

## 2021-06-08 ENCOUNTER — Other Ambulatory Visit: Payer: Self-pay

## 2021-06-08 ENCOUNTER — Non-Acute Institutional Stay: Payer: Medicare Other | Admitting: Primary Care

## 2021-06-08 DIAGNOSIS — F0391 Unspecified dementia with behavioral disturbance: Secondary | ICD-10-CM

## 2021-06-08 DIAGNOSIS — Z515 Encounter for palliative care: Secondary | ICD-10-CM

## 2021-06-08 NOTE — Progress Notes (Signed)
[facility] Palliative Care Consult Note Telephone: [phone]  Fax: [phone]    Date of encounter: 06/08/21 PATIENT NAME: Carol Beard [place] [address]   [phone] (home)  DOB: 16-Jan-1946 MRN: [id number] PRIMARY CARE PROVIDER:    Brayton Mars, MD,  [address] [place] [address] [phone]  [place]:   Brayton Mars, [place],   [address] [phone]  RESPONSIBLE PARTY:    Contact Information     Name Relation Home Work [address]   Carol, Beard Brother [phone]  [phone]        I met face to face with patient in [facility] facility. Palliative Care was asked to follow this patient by consultation request of  Brayton Mars, MD to address advance care planning and complex medical decision making. This is a follow up visit.                                   ASSESSMENT AND PLAN / RECOMMENDATIONS:   Advance Care Planning/Goals of Care: Goals include to maximize quality of life and symptom management. Our advance care planning conversation included a discussion about:  CODE STATUS: DNR and MOST uploaded. Brother (POA) returned to me and I have put in patient hard chart. Limited scope of treatments  Symptom Management/Plan:  I met with patient in her nursing home she was sitting in wheelchair having eaten lunch. Staff reports that she requires total feeding and is eating about 50% of her food. She has been steadily gaining weight now, some 8 pounds in the last 4 to 6 months.   Her affect today is flat and she does not appear very interactive. In previous interactions she has been smiling and attempting a conversational interaction. Staff reported she had had some severe behaviors, outbursts yelling and resisting care in the past month. These have been managed now with  [facility] psych consultation. She was discontinued off of several of her previous  medications and now is taking Aricept , Namenda and has a PRN Haldol. Staff report  TID Haldol of 0.25 mg was very sedating and once that was stopped she has been well managed. She is not requiring the PRN Haldol very often.   Follow up Palliative Care Visit: Palliative care will continue to follow for complex medical decision making, advance care planning, and clarification of goals. Return 6 weeks or prn.  I spent 25 minutes providing this consultation. More than 50% of the time in this consultation was spent in counseling and care coordination.  PPS: 30%  HOSPICE ELIGIBILITY/DIAGNOSIS: TBD  Chief Complaint: debility  HISTORY OF PRESENT ILLNESS:  Carol Beard is a 75 y.o. year old female  with dementia with some behavior disturbances, debility .   History obtained from review of EMR, discussion with primary team, and interview with family, facility staff/caregiver and/or Ms. Tullis.  I reviewed available labs, medications, imaging, studies and related documents from the EMR.  Records reviewed and summarized above.   ROS/staff  General: NAD ENMT: endorses mild dysphagia Cardiovascular: denies chest pain, denies DOE Pulmonary: denies cough, denies increased SOB Abdomen: endorses good appetite, denies constipation, endorses incontinence of bowel GU: denies dysuria, endorses incontinence of urine MSK:  endorses weakness,  no falls reported Skin: denies rashes or wounds Neurological: denies pain, denies insomnia Psych: Endorses flat  mood at present Heme/lymph/immuno: denies bruises, abnormal bleeding  Physical Exam: Current and past weights: 107 lbs, continues to gain weight Constitutional: NAD General: frail appearing, thin EYES: anicteric sclera, lids intact, no discharge  ENMT: intact hearing, oral mucous membranes moist, dentition intact CV: RRR, no LE edema Pulmonary: no increased work of breathing, no cough, room air Abdomen: intake 50%,  no ascites GU: deferred MSK:  ++ sarcopenia, non ambulatory Skin: warm and dry, no rashes or wounds on visible skin Neuro:  ++ generalized weakness,  severe  cognitive impairment Psych: non-anxious affect, A and O x 1 Hem/lymph/immuno: no widespread bruising   Thank you for the opportunity to participate in the care of Ms. Boultinghouse.  The palliative care team will continue to follow. Please call our office at [phone] if we can be of additional assistance.   Jason Coop, NP , DNP, MPH, AGPCNP-BC, ACHPN  COVID-19 PATIENT SCREENING TOOL Asked and negative response unless otherwise noted:   Have you had symptoms of covid, tested positive or been in contact with someone with symptoms/positive test in the past 5-10 days?

## 2021-09-12 ENCOUNTER — Emergency Department: Payer: Medicare Other

## 2021-09-12 ENCOUNTER — Other Ambulatory Visit: Payer: Self-pay

## 2021-09-12 ENCOUNTER — Emergency Department
Admission: EM | Admit: 2021-09-12 | Discharge: 2021-09-12 | Disposition: A | Discharge status: Home / Self Care | Payer: Medicare Other | Attending: Emergency Medicine | Admitting: Emergency Medicine

## 2021-09-12 DIAGNOSIS — W050XXA Fall from non-moving wheelchair, initial encounter: Secondary | ICD-10-CM | POA: Insufficient documentation

## 2021-09-12 DIAGNOSIS — F0391 Unspecified dementia with behavioral disturbance: Secondary | ICD-10-CM | POA: Diagnosis not present

## 2021-09-12 DIAGNOSIS — Z7982 Long term (current) use of aspirin: Secondary | ICD-10-CM | POA: Diagnosis not present

## 2021-09-12 DIAGNOSIS — W19XXXA Unspecified fall, initial encounter: Secondary | ICD-10-CM

## 2021-09-12 DIAGNOSIS — S0081XA Abrasion of other part of head, initial encounter: Secondary | ICD-10-CM | POA: Insufficient documentation

## 2021-09-12 DIAGNOSIS — S0993XA Unspecified injury of face, initial encounter: Secondary | ICD-10-CM | POA: Diagnosis present

## 2021-09-12 DIAGNOSIS — S0990XA Unspecified injury of head, initial encounter: Secondary | ICD-10-CM

## 2021-09-12 NOTE — ED Triage Notes (Signed)
See first nurse note, pt not answering any questions. Sitting in recliner screaming "no". Makes eye contact with RN, appears to try to follow commands.  MSE Manuela Schwartz PA while triaging.  Appears to withdraw from pain to right hip. Laceration right forehead Verbal orders susan PA

## 2021-09-12 NOTE — ED Provider Notes (Signed)
[facility] Emergency Department Provider Note  Time seen: 11:50 AM  I have reviewed the triage vital signs and the nursing notes.   HISTORY  Chief Complaint Fall   HPI Carol Beard is a 75 y.o. female with a past medical history of aphasia, dementia with behavioral disturbance, presents to the emergency department after a fall.  According to EMS report patient is from [place] where she fell from her wheelchair.  Patient does have an abrasion to the right side of her forehead.  No reported anticoagulation.  Patient was grabbing at her right hip per EMS report.  During my evaluation patient is holding her left hip.  Does not appear to be in any pain with range of motion although yells at you when you touch her.  Patient unable to answer questions or follow commands however per report this is baseline.   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 Authorizing Provider  acetaminophen (TYLENOL) 325 MG tablet Take 650 mg by mouth 2 (two) times daily.    [provider]  aspirin EC 81 MG tablet Take 81 mg by mouth daily.     [provider]  diclofenac Sodium (VOLTAREN) 1 % GEL Apply 4 g topically daily.    [provider]  donepezil (ARICEPT) 5 MG tablet Take 5 mg by mouth daily. 12/30/16   [provider]  haloperidol (HALDOL) 0.5 MG tablet Take 0.25 mg by mouth daily as needed for agitation.    [provider]  memantine (NAMENDA) 10 MG tablet Take 10 mg by mouth 2 (two) times daily. 09/01/19 06/08/21  [provider]  Polyvinyl Alcohol-Povidone ([place] OP) Apply to eye.    [provider]  vitamin B-12 1000 MCG tablet Take 1 tablet (1,000 mcg total) by mouth daily. 11/01/20   Nolberto Hanlon, MD    Allergies  Allergen Reactions   Penicillins     Family History  Problem Relation Age of Onset   Cancer Mother        Colon   Stroke Father    Breast cancer Neg Hx     Social History Social History   Tobacco Use   Smoking status: Never   Smokeless tobacco: Never  Vaping Use   Vaping Use: Never used  Substance Use Topics   Alcohol use: No  Alcohol/week: 0.0 standard drinks    Comment: occasional wine with dinner   Drug use: No    Review of Systems Unable to obtain adequate/accurate review of systems secondary to baseline dementia.  ____________________________________________   PHYSICAL EXAM:  VITAL SIGNS: ED Triage Vitals  Enc Vitals Group     BP 09/12/21 0824 (!) 120/56     Pulse Rate 09/12/21 0824 71     Resp 09/12/21 0824 19     Temp 09/12/21 0824 97.7 F (36.5 C)     Temp Source 09/12/21 0824 Oral     SpO2 09/12/21 0824 94 %     Weight 09/12/21 0833 97 lb (44 kg)     Height 09/12/21 0833 5\' 2"  (1.575 m)     Head Circumference --      Peak Flow --      Pain Score --      Pain Loc --      Pain Edu?  --      Excl. in [place]? --    Constitutional: Awake alert, sitting in bed, no acute distress. Eyes: Normal exam ENT      Head: Small abrasion to right forehead, hemostatic.      Mouth/Throat: Mucous membranes are moist. Cardiovascular: Normal rate, regular rhythm.  Respiratory: Normal respiratory effort without tachypnea nor retractions. Breath sounds are clear Gastrointestinal: Soft, no reaction to abdominal palpation.  No distention. Musculoskeletal: Patient initially holding her left hip on my evaluation, I was able to range both lower extremities with no apparent discomfort.  No shortening or rotation.  Great range of motion in upper extremities as well. Neurologic: Patient yells "no."  Moves all extremities at times.  Unable to perform a more in-depth neurological exam due to dementia history. Skin:  Skin is warm.  Small abrasion to right forehead, hemostatic Psychiatric: Agitated at times.  ____________________________________________     RADIOLOGY  CT scan of the head and C-spine showed no acute abnormality. X-rays of the right and left hip as well as pelvis are negative for acute bony abnormality. Knee x-rays negative. ____________________________________________   INITIAL IMPRESSION / ASSESSMENT AND PLAN / ED COURSE  Pertinent labs & imaging results that were available during my care of the patient were reviewed by me and considered in my medical decision making (see chart for details).   Patient presents to the emergency department from her nursing facility by EMS after a witnessed fall out of her wheelchair.  Patient does have an abrasion to the right forehead.  On examination triage nurse states patient was holding her right hip she is now holding her left lower extremity.  Great range of motion of bilateral lower extremities with no apparent discomfort.  However given her dementia which limits physical exam we will obtain imaging of the right and left hip as well as left  knee as a precaution.  CT imaging of the head and C-spine are negative for acute abnormality.  Imaging including CT imaging of the head, C-spine, left knee x-ray, left and right hip x-ray as well as pelvis did not show any acute traumatic injury.  Patient will be discharged back to her nursing facility.  SANITA ESTRADA was evaluated in Emergency Department on 09/12/2021 for the symptoms described in the history of present illness. She was evaluated in the context of the global COVID-19 pandemic, which necessitated consideration that the patient might be at risk for infection with the SARS-CoV-2 virus that causes COVID-19. Institutional protocols and algorithms that pertain  to the evaluation of patients at risk for COVID-19 are in a state of rapid change based on information released by regulatory bodies including the CDC and federal and state organizations. These policies and algorithms were followed during the patient's care in the ED.  ____________________________________________   FINAL CLINICAL IMPRESSION(S) / ED DIAGNOSES  Cheral Marker, MD 09/12/21 [phone]

## 2021-09-12 NOTE — ED Triage Notes (Signed)
Pt comes into the ED via [facility] from [facility] c/o witnessed fall from her wheelchair.  Pt hit the right side of her face on the floor with no LOC and no blood thinners.  Pt is also grabbing at her right hip.  No instability felt, no shortening or external rotation noted. Pt has a h/o dementia, and patient is currently is at her baseline.   145/62 54 HR 99% RA 91 CBG

## 2021-09-12 NOTE — ED Provider Notes (Signed)
Emergency Medicine Provider Triage Evaluation Note  Carol Beard , a 75 y.o. female  was evaluated in triage.  Pt complains of witnessed fall.  See triage note.  Review of Systems  Positive: Fall, right hip pain, laceration to the right side of the head Negative: Unable to assess due to patient's dementia and nonverbal  Physical Exam  BP (!) 120/56 (BP Location: Left Arm)   Pulse 71   Temp 97.7 F (36.5 C) (Oral)   Resp 19   Ht 5\' 2"  (1.575 m)   Wt 44 kg   SpO2 94%   BMI 17.74 kg/m  Gen:   Awake, no distress   Resp:  Normal effort  MSK:   Patient in recliner, winces when I touch the right hip Other:    Medical Decision Making  Medically screening exam initiated at 8:38 AM.  Appropriate orders placed.  ANNALEA ALGUIRE was informed that the remainder of the evaluation will be completed by another provider, this initial triage assessment does not replace that evaluation, and the importance of remaining in the ED until their evaluation is complete.     Versie Starks, PA-C 09/12/21 3212    Blake Divine, MD 09/12/21 1201

## 2021-09-26 ENCOUNTER — Non-Acute Institutional Stay: Payer: Medicare Other | Admitting: Primary Care

## 2021-09-26 ENCOUNTER — Other Ambulatory Visit: Payer: Self-pay

## 2021-09-26 DIAGNOSIS — F03918 Unspecified dementia, unspecified severity, with other behavioral disturbance: Secondary | ICD-10-CM

## 2021-09-26 DIAGNOSIS — F0280 Dementia in other diseases classified elsewhere without behavioral disturbance: Secondary | ICD-10-CM

## 2021-09-26 DIAGNOSIS — Z515 Encounter for palliative care: Secondary | ICD-10-CM

## 2021-09-26 NOTE — Progress Notes (Signed)
[facility] Palliative Care Consult Note Telephone: [phone]  Fax: [phone]    Date of encounter: 09/26/21 2:03 PM PATIENT NAME: Carol Beard [id number]   [phone] (home)  DOB: 03-14-1946 MRN: [id number] PRIMARY CARE PROVIDER:    Brayton Mars, MD,  [address] [place] [address] [phone]  [place]:   Brayton Mars, [place],  [place] [address] [phone]  RESPONSIBLE PARTY:    Contact Information     Name Relation Home Work [address]   Carol Beard, [place] Brother [phone]  [phone]       I met face to face with patient and [facility] facility. Palliative Care was asked to follow this patient by consultation request of  Brayton Mars, MD to address advance care planning and complex medical decision making. This is a follow up visit.                                   ASSESSMENT AND PLAN / RECOMMENDATIONS:   Advance Care Planning/Goals of Care: Goals include to maximize quality of life and symptom management.  T.c to POA brother Carol Beard. No answer, message left.  CODE STATUS: DNR  ACP as below:   Cardiopulmonary Resuscitation: Do Not Attempt Resuscitation (DNR/No CPR)  Medical Interventions: Limited Additional Interventions: Use medical treatment, IV fluids and cardiac monitoring as indicated, DO NOT USE intubation or mechanical ventilation. May consider use of less invasive airway support such as BiPAP or CPAP. Also provide comfort measures. Transfer to the hospital if indicated. Avoid intensive care.   Antibiotics: Determine use of limitation of antibiotics when infection occurs  IV Fluids: IV fluids for a defined trial period  Feeding Tube: Feeding tube for a defined trial period    Symptom Management/Plan:  Fall: Has had a fall and went to ED for eval. Had an abrasion on her R forehead, and R hand now has a bruise which is healing. No changes  in neuro signs at this time, and does not seem to be pained by sequelae. Continue to monitor for fall prevention. Appears to scoot out of her w/c seat. Imaging reviewed  Agitation: Appears to be agitated with several residents in the SNF. Staff cannot discern why she is bothered and becomes agitated. She is currently on depakote 125 mg tid. Recommend 250 mg bid, and assessment by psychiatry. I would recommend seroquel if agitation continues, or consideration of a prn such as lorazepam.  Nutrition: Wt is 103.7. continued gain up from 98 lbs in 9/22. Continues to eat well and has to be fed.  Follow up Palliative Care Visit: Palliative care will continue to follow for complex medical decision making, advance care planning, and clarification of goals. Return 6-8 weeks or prn.  I spent 35 minutes providing this consultation. More than 50% of the time in this consultation was spent in counseling and care coordination.  PPS: 40%  HOSPICE ELIGIBILITY/DIAGNOSIS: TBD  Chief Complaint: agitation  HISTORY OF PRESENT ILLNESS:  Carol Beard is a 75 y.o. year old female  with dementia with agitation, aphasia, falls .   History obtained from review of EMR, discussion with primary team, and interview with family, facility staff/caregiver and/or Carol Beard.  I reviewed available labs, medications, imaging, studies and related documents from the EMR.  Records reviewed and summarized above.   ROS   General: NAD ENMT:  endorses mild  dysphagia Cardiovascular: denies chest pain, denies DOE Pulmonary: denies cough, denies increased SOB Abdomen: endorses good appetite, denies constipation, endorses incontinence of bowel GU: denies dysuria, endorses incontinence of urine MSK:  endorses  weakness,  + falls reported Skin: denies rashes or wounds Neurological: denies pain, denies insomnia Psych: Endorses agitated at times mood Heme/lymph/immuno: denies bruises, abnormal bleeding  Physical Exam: Current  and past weights: 103 lbs, gain 5 lbs Constitutional: NAD General: frail appearing, thin/ EYES: anicteric sclera, lids intact, no discharge  ENMT: intact hearing, oral mucous membranes moist, dentition intact CV: RRR, no LE edema Pulmonary: no increased work of breathing, no cough, room air Abdomen: intake 75%, no ascites GU: deferred MSK: + sarcopenia, moves all extremities, non ambulatory, uses w/c for mobility Skin: warm and dry, no rashes or wounds on visible skin Neuro:  ++ generalized weakness,  n++cognitive impairment Psych: anxious affect, A and O x 1 Hem/lymph/immuno: no widespread bruising  Thank you for the opportunity to participate in the care of Carol Beard.  The palliative care team will continue to follow. Please call our office at [phone] if we can be of additional assistance.   Jason Coop, NP DNP, AGPCNP-BC  COVID-19 PATIENT SCREENING TOOL Asked and negative response unless otherwise noted:   Have you had symptoms of covid, tested positive or been in contact with someone with symptoms/positive test in the past 5-10 days?

## 2021-12-28 ENCOUNTER — Other Ambulatory Visit: Payer: Medicare Other | Admitting: Primary Care

## 2021-12-28 ENCOUNTER — Other Ambulatory Visit: Payer: Self-pay

## 2021-12-28 VITALS — Ht 62.0 in | Wt 100.6 lb

## 2021-12-28 DIAGNOSIS — F0280 Dementia in other diseases classified elsewhere without behavioral disturbance: Secondary | ICD-10-CM

## 2021-12-28 DIAGNOSIS — F03918 Unspecified dementia, unspecified severity, with other behavioral disturbance: Secondary | ICD-10-CM

## 2021-12-28 NOTE — Progress Notes (Signed)
[facility] Palliative Care Consult Note Telephone: [phone]  Fax: [phone]    Date of encounter: 12/28/21 10:36 AM PATIENT NAME: Carol Beard Carol Beard [id number]   [phone] (home)  DOB: December 26, 1945 MRN: [id number] PRIMARY CARE PROVIDER:    Brayton Mars, MD,  [address] [place] [address] [phone]  REFERRING PROVIDER:   Brayton Mars, [place],   [address] [phone]  RESPONSIBLE PARTY:    Contact Information     Name Relation Home Work [address]   [place], [place] Brother [phone]  [phone]        I met face to face with patient in [facility] facility. Palliative Care was asked to follow this patient by consultation request of  Brayton Mars, MD to address advance care planning and complex medical decision making. This is a follow up visit.                                   ASSESSMENT AND PLAN / RECOMMENDATIONS:   Advance Care Planning/Goals of Care: Goals include to maximize quality of life and symptom management.  Identification of a healthcare agent - brother [name] Attempted phone call to Warner Mccreedy, no answer, message left.  CODE STATUS: DNR.  I reviewed  a MOST form today. The patient and family outlined their wishes for the following treatment decisions:  Cardiopulmonary Resuscitation: Do Not Attempt Resuscitation (DNR/No CPR)  Medical Interventions: Limited Additional Interventions: Use medical treatment, IV fluids and cardiac monitoring as indicated, DO NOT USE intubation or mechanical ventilation. May consider use of less invasive airway support such as BiPAP or CPAP. Also provide comfort measures. Transfer to the hospital if indicated. Avoid intensive care.   Antibiotics: Determine use of limitation of antibiotics when infection occurs  IV Fluids: IV fluids for a defined trial period  Feeding Tube: Feeding tube for a defined trial period     Symptom Management/Plan:  Pt up in w/c, able to make small gains in self propelling. She is relaxed and smiling today, staff reports stability, no agitated behaviors noted. Weights are stable, pt is interactive with single word answers. Dependent in all adls.  Follow up Palliative Care Visit: Palliative care will continue to follow for complex medical decision making, advance care planning, and clarification of goals. Return 8 weeks or prn.  This visit was coded based on medical decision making (MDM).  PPS: 40%  HOSPICE ELIGIBILITY/DIAGNOSIS: TBD  Chief Complaint: debility, aphasia  HISTORY OF PRESENT ILLNESS:  Carol Beard is a 76 y.o. year old female  with primary progressive aphasia, dementia, debility, immobility .   History obtained from review of EMR, discussion with primary team, and interview with family, facility staff/caregiver and/or Carol Beard.  I reviewed available labs, medications, imaging, studies and related documents from the EMR.  Records reviewed and summarized above.   ROS/staff General: NAD ENMT: denies dysphagia Pulmonary: endorses occ cough, denies increased SOB Abdomen: endorses good appetite, denies constipation, endorses incontinence of bowel GU: denies dysuria, endorses incontinence of urine MSK:  denies  increased weakness,  no falls reported Skin: denies rashes or wounds Neurological: denies pain, denies insomnia Psych: Endorses positive mood Heme/lymph/immuno: denies bruises, abnormal bleeding  Physical Exam: Current and past weights:Body mass index is 18.4 kg/m. Filed Weights   12/28/21 1035  Weight: 100 lb 9.6 oz (45.6 kg)  Stable   Constitutional:  NAD General: frail appearing, thin EYES: anicteric sclera, lids intact, no discharge  ENMT: intact hearing, oral mucous membranes moist, dentition intact CV:  RRR, no LE edema Pulmonary: no increased work of breathing, no cough, room air Abdomen: intake 75%, no ascites GU:  deferred MSK: + sarcopenia, moves all extremities,  non ambulatory Skin: warm and dry, no rashes or wounds on visible skin Neuro:  +_generalized weakness,  severe  cognitive impairment, hand tremors Psych: non-anxious affect, A and O x 1 Hem/lymph/immuno: no widespread bruising  Outpatient Encounter Medications as of 12/28/2021  Medication Sig   acetaminophen (TYLENOL) 325 MG tablet Take 650 mg by mouth 2 (two) times daily.   aspirin EC 81 MG tablet Take 81 mg by mouth daily.    BOUDREAUXS BUTT PASTE 16 % OINT Apply topically.   citalopram (CELEXA) 20 MG tablet Take 20 mg by mouth daily.   diclofenac Sodium (VOLTAREN) 1 % GEL Apply 4 g topically daily.   divalproex (DEPAKOTE SPRINKLE) 125 MG capsule Take 125 mg by mouth 3 (three) times daily.   donepezil (ARICEPT) 5 MG tablet Take 5 mg by mouth daily.   melatonin 5 MG TABS Take 5 mg by mouth at bedtime.   memantine (NAMENDA) 10 MG tablet Take 10 mg by mouth 2 (two) times daily.   Polyvinyl Alcohol-Povidone ([place] OP) Apply to eye.   vitamin B-12 1000 MCG tablet Take 1 tablet (1,000 mcg total) by mouth daily.   [DISCONTINUED] memantine (NAMENDA) 10 MG tablet Take 1 tablet by mouth 2 (two) times daily.   [DISCONTINUED] haloperidol (HALDOL) 0.5 MG tablet Take 0.25 mg by mouth daily as needed for agitation. (Patient not taking: No sig reported)   No facility-administered encounter medications on file as of 12/28/2021.     Thank you for the opportunity to participate in the care of Carol Beard.  The palliative care team will continue to follow. Please call our office at [phone] if we can be of additional assistance.   Jason Coop, NP DNP, AGPCNP-BC  COVID-19 PATIENT SCREENING TOOL Asked and negative response unless otherwise noted:   Have you had symptoms of covid, tested positive or been in contact with someone with symptoms/positive test in the past 5-10 days?

## 2022-02-22 ENCOUNTER — Non-Acute Institutional Stay: Payer: Medicare Other | Admitting: Primary Care

## 2022-02-22 ENCOUNTER — Other Ambulatory Visit: Payer: Self-pay

## 2022-02-22 DIAGNOSIS — G3101 Pick's disease: Secondary | ICD-10-CM

## 2022-02-22 DIAGNOSIS — F03918 Unspecified dementia, unspecified severity, with other behavioral disturbance: Secondary | ICD-10-CM

## 2022-02-22 DIAGNOSIS — R634 Abnormal weight loss: Secondary | ICD-10-CM

## 2022-02-22 DIAGNOSIS — Z515 Encounter for palliative care: Secondary | ICD-10-CM

## 2022-02-22 DIAGNOSIS — F0280 Dementia in other diseases classified elsewhere without behavioral disturbance: Secondary | ICD-10-CM

## 2022-02-22 NOTE — Progress Notes (Signed)
? ? ?Manufacturing engineer ?Community Palliative Care Consult Note ?Telephone: [phone]  ?Fax: [phone]  ? ? ?Date of encounter: 02/22/22 ?11:52 AM ?PATIENT NAME: Carol Beard ?C/o Carol Beard ?Carol Beard ?[place] [address]   ?[phone] (home)  ?DOB: 13-Dec-1946 ?MRN: [id number] ?PRIMARY CARE PROVIDER:    ?Brayton Mars, MD,  ?[place] ?[place] [address] ?[phone] ? ?REFERRING PROVIDER:   ?Brayton Mars, MD ?[place] ?[place],  [place] [address] ?[phone] ? ?RESPONSIBLE PARTY:    ?Contact Information   ? ? Name Relation Home Work Mobile  ? Carol Beard Brother [phone]  [phone]  ? ?  ? ? ? ?I met face to face with patient in [facility] facility. Palliative Care was asked to follow this patient by consultation request of  Brayton Mars, MD to address advance care planning and complex medical decision making. This is a follow up visit. ? ?                                 ASSESSMENT AND PLAN / RECOMMENDATIONS:  ? ?Advance Care Planning/Goals of Care: Goals include to maximize quality of life and symptom management.  ?CODE STATUS: DNR ? ?Symptom Management/Plan: ? ?Nutrition: must be fed, eating well and gaining a bit of weight. Does have some slight LE edema. ? ?Behavior disturbances: Has not demonstrated with 1 particular resident for some weeks. Appears happy and relaxed.  ? ?Mobility: In w/c or tranferred to bed.Is able to mobilize in w/c with feet around building. ? ?LE Edema: Today has red LE, slight edema. Seems to be from dependence. She does not endorse pain. ? ?Follow up Palliative Care Visit: Palliative care will continue to follow for complex medical decision making, advance care planning, and clarification of goals. Return 6 weeks or prn. ? ?This visit was coded based on medical decision making (MDM). ? ?PPS: 30% ? ?HOSPICE ELIGIBILITY/DIAGNOSIS: TBD ? ?Chief Complaint: dementia, occ behavior disturbances ? ?HISTORY OF PRESENT ILLNESS:  Carol Beard  is a 76 y.o. year old female  with aphasia, dementia, immobility. . Patient seen today to review palliative care needs to include medical decision making and advance care planning as appropriate.  ? ?History obtained from review of EMR, discussion with primary team, and interview with family, facility staff/caregiver and/or Carol Beard.  ?I reviewed available labs, medications, imaging, studies and related documents from the EMR.  Records reviewed and summarized above.  ? ?ROS ? ?General: NAD ?ENMT: denies dysphagia ?Pulmonary: denies cough, denies increased SOB ?Abdomen: endorses good appetite, denies constipation, endorses incontinence of bowel ?GU: denies dysuria, endorses incontinence of urine ?MSK:  denies  increased weakness,  no falls reported ?Skin: denies rashes or wounds ?Neurological: denies pain, denies insomnia ?Psych: Endorses positive mood ? ?Physical Exam: ?Current and past weights: 97.8 lbs, slight increase ?Constitutional: NAD ?General: frail appearing, thin ?EYES: anicteric sclera, lids intact, no discharge  ?ENMT: intact hearing, oral mucous membranes moist, dentition intact ?CV: S1S2, RRR, slight bil  LE edema ?Pulmonary: no increased work of breathing, no cough, room air ?Abdomen: intake 75%, no ascites ?MSK: + sarcopenia, moves all extremities, non ambulatory, moves in w/c with feet ?Skin: warm and dry, no rashes or wounds on visible skin ?Neuro:  + generalized weakness,  + cognitive impairment, non-anxious affect ? ?Thank you for the opportunity to participate in the care of Carol Beard.  The palliative care team will continue to follow. Please  call our office at [phone] if we can be of additional assistance.  ? ?Jason Coop, NP DNP, AGPCNP-BC ? ?COVID-19 PATIENT SCREENING TOOL ?Asked and negative response unless otherwise noted:  ? ?Have you had symptoms of covid, tested positive or been in contact with someone with symptoms/positive test in the past 5-10 days?  ? ?

## 2022-03-10 IMAGING — CT CT HEAD W/O CM
2 series · 15 of 37 positions shown, 18 images · non-contrast
Comparison: None.

CT head April 10, 2020.

CLINICAL DATA: Altered mental status with trauma.

EXAM:
CT HEAD WITHOUT CONTRAST
CT CERVICAL SPINE WITHOUT CONTRAST
TECHNIQUE: Multidetector CT imaging of the head and cervical spine was
performed following the standard protocol without intravenous
contrast. Multiplanar CT image reconstructions of the cervical spine
were also generated.

[Series 3: head wo · axial · 0.41mm/px · z∈[-354,-198]mm · 12 of 37 slices shown, 15 images]
[im 3/37  brain]
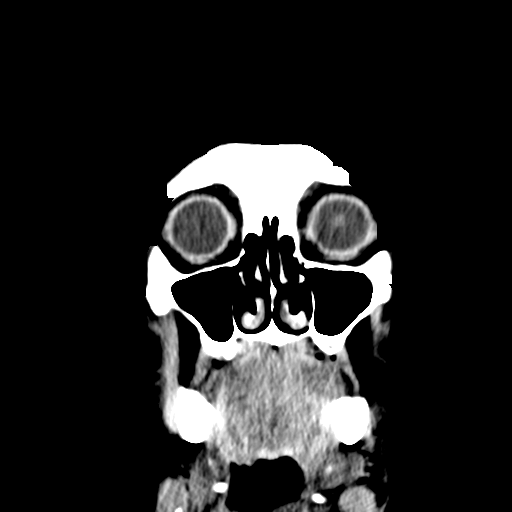
[im 3/37  bone]
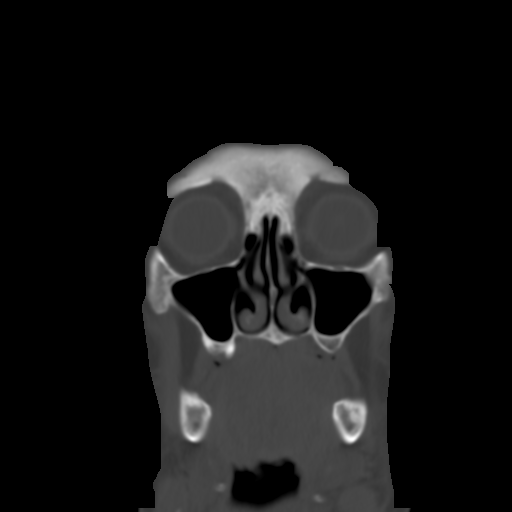
[im 5/37  brain]
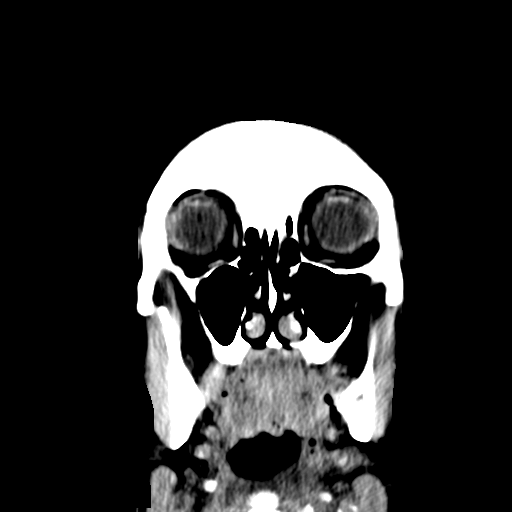
[im 8/37  brain]
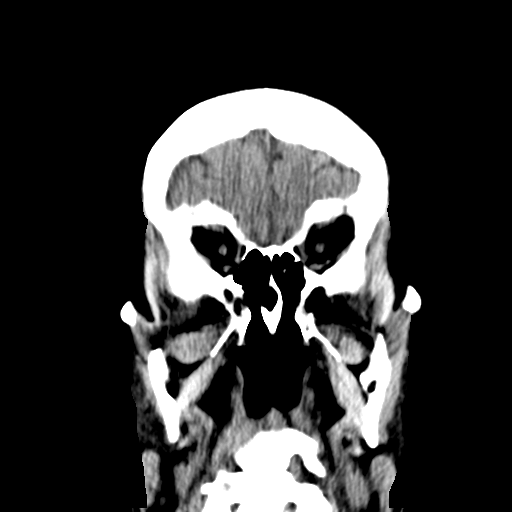
[im 12/37  brain]
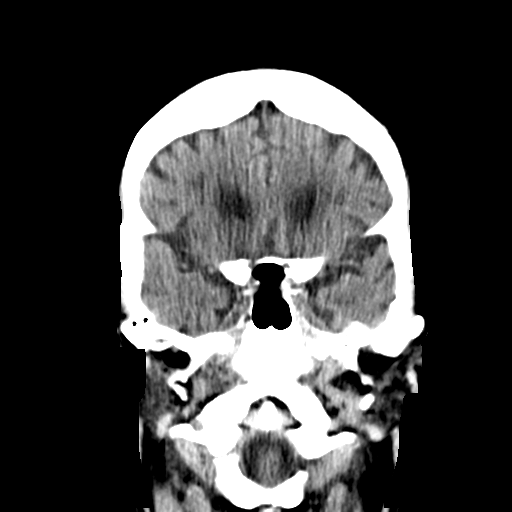
[im 14/37  brain]
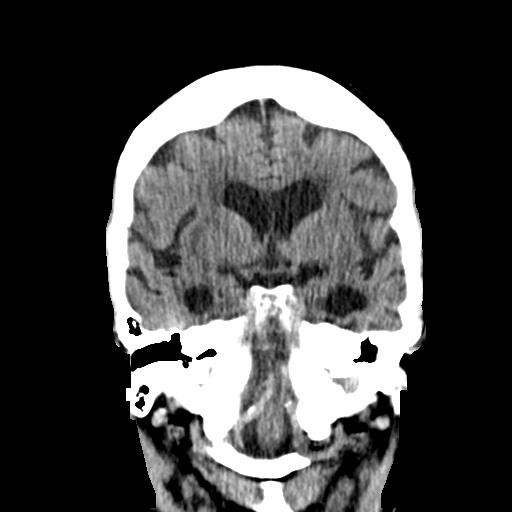
[im 14/37  bone]
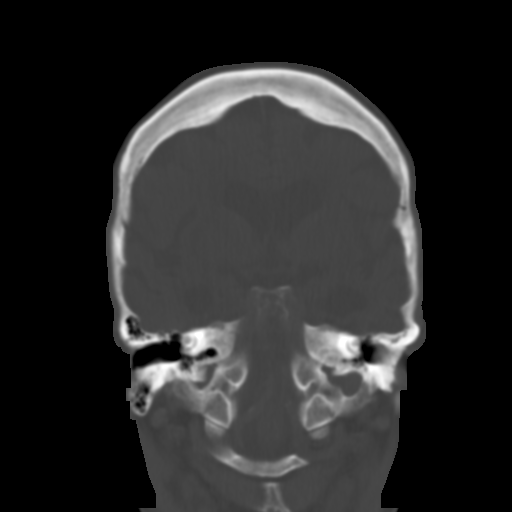
[im 17/37  brain]
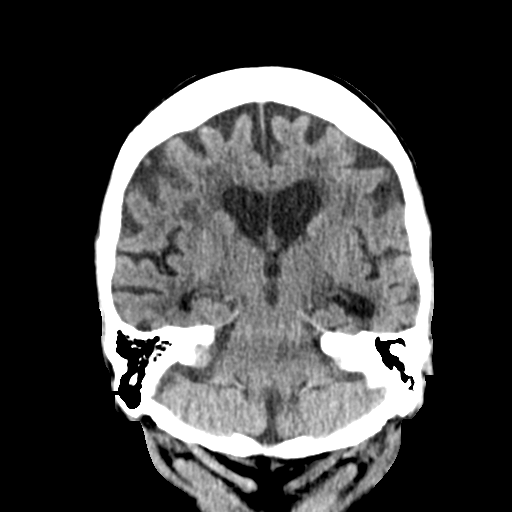
[im 20/37  brain]
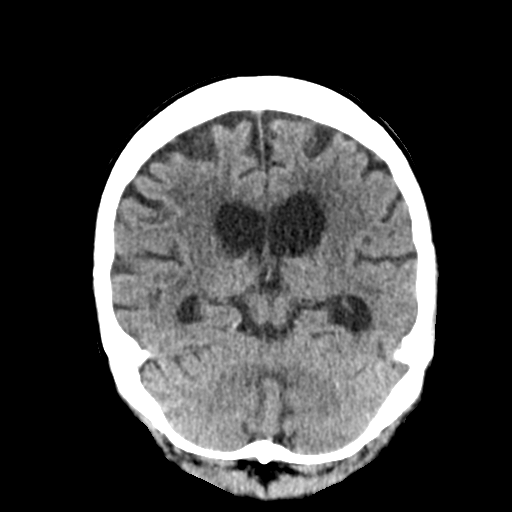
[im 23/37  brain]
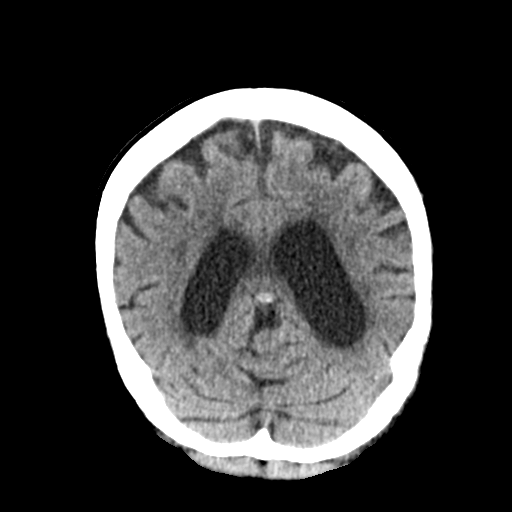
[im 25/37  brain]
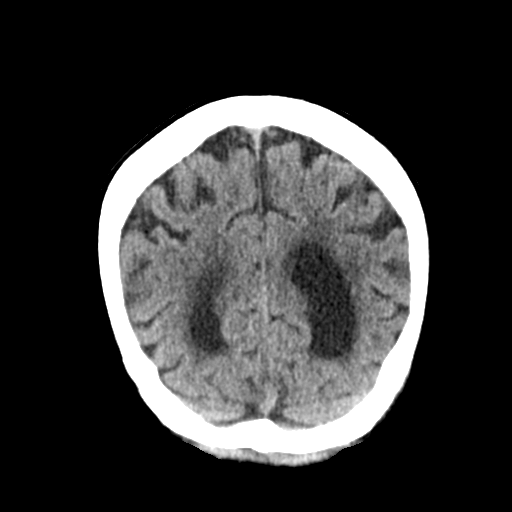
[im 25/37  bone]
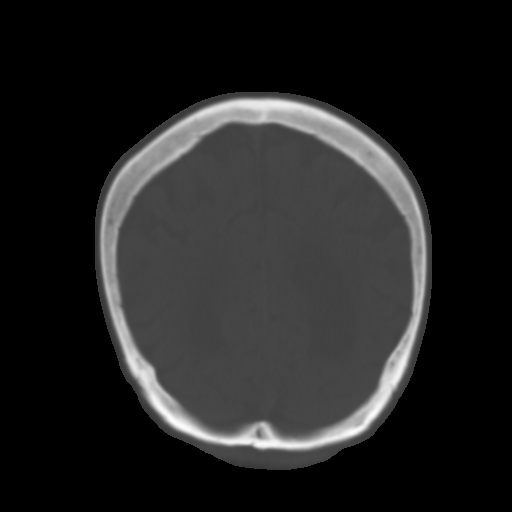
[im 29/37  brain]
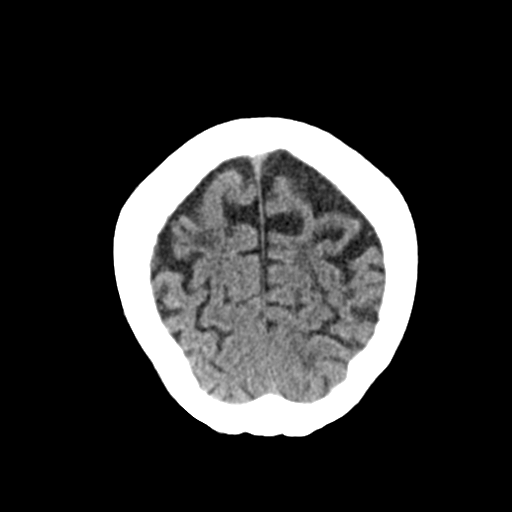
[im 32/37  brain]
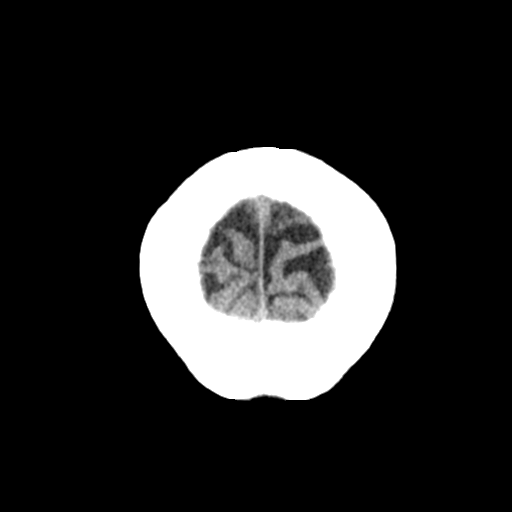
[im 34/37  brain]
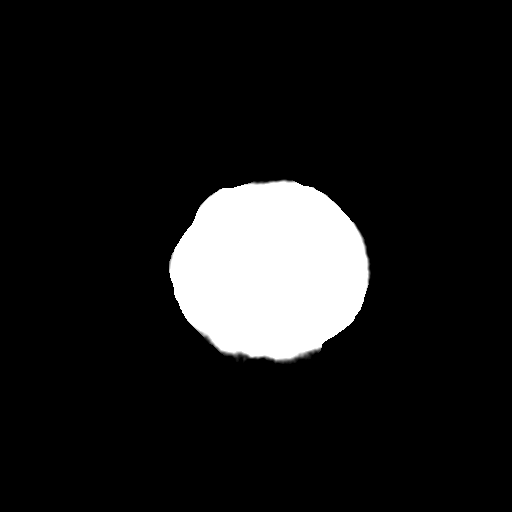

[Series 8: sagittal soft tissue · sagittal · 0.33mm/px · 3 of 48 slices shown]
[im 16/48  brain]
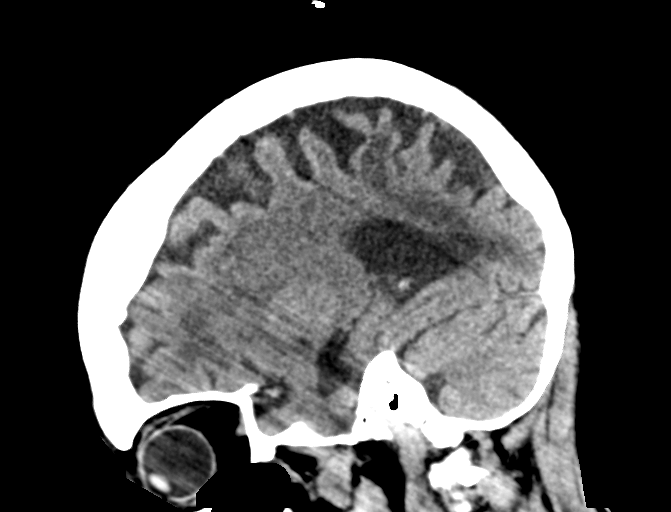
[im 24/48  brain]
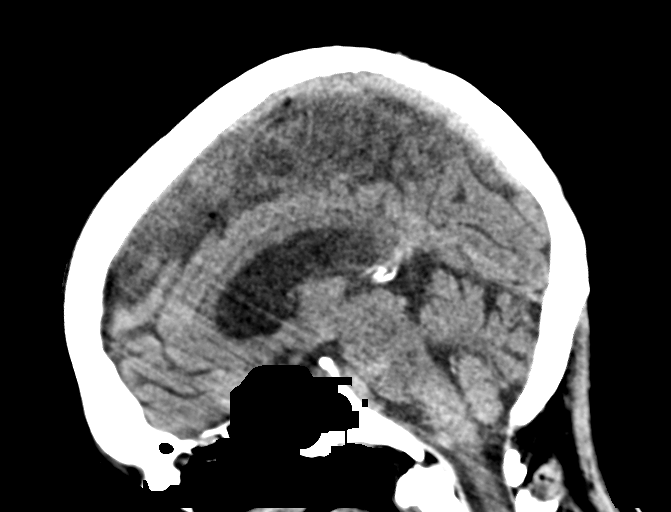
[im 32/48  brain]
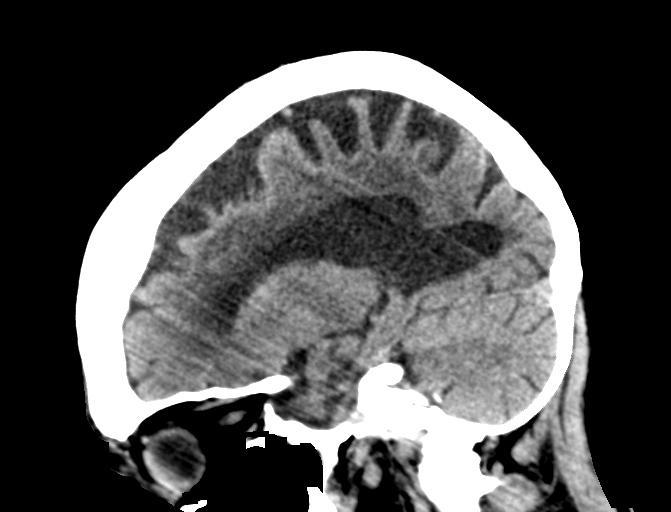

[15 of 37 positions shown; findings below may reference images not displayed]

FINDINGS: CT HEAD FINDINGS

Brain: No evidence of acute large vascular territory infarction,
hemorrhage, hydrocephalus, extra-axial collection or mass
lesion/mass effect. Similar patchy white matter hypoattenuation,
most likely related to chronic microvascular ischemic disease.
Similar diffuse cerebral atrophy with ex vacuo ventricular dilation.

Vascular: Calcific atherosclerosis.

Skull: No acute fracture.

Sinuses/Orbits: No acute findings.

Other: No mastoid effusions.

CT CERVICAL SPINE FINDINGS

Alignment: Broad levocurvature.  No substantial subluxation.

Skull base and vertebrae: No acute fracture. Vertebral body heights
are maintained. No primary bone lesion or focal pathologic process.

Soft tissues and spinal canal: No prevertebral fluid or swelling. No
visible canal hematoma.

Disc levels: Mild multilevel facet degenerative change. No
significant bony canal stenosis.

Upper chest: Negative.

Other: Calcific atherosclerosis of the carotids.
IMPRESSION: CT head:

1. No evidence of acute intracranial abnormality.
2. Similar chronic microvascular ischemic disease and generalized
atrophy.

CT cervical spine:

1. No evidence of acute fracture or traumatic malalignment.

## 2022-03-10 IMAGING — DX DG CHEST 1V PORT
1 series · 1 of 1 positions shown · non-contrast
Comparison: 04/10/2020

CLINICAL DATA: Mental status change.  Fall 1 week ago.

EXAM:
PORTABLE CHEST 1 VIEW

[chest ap]
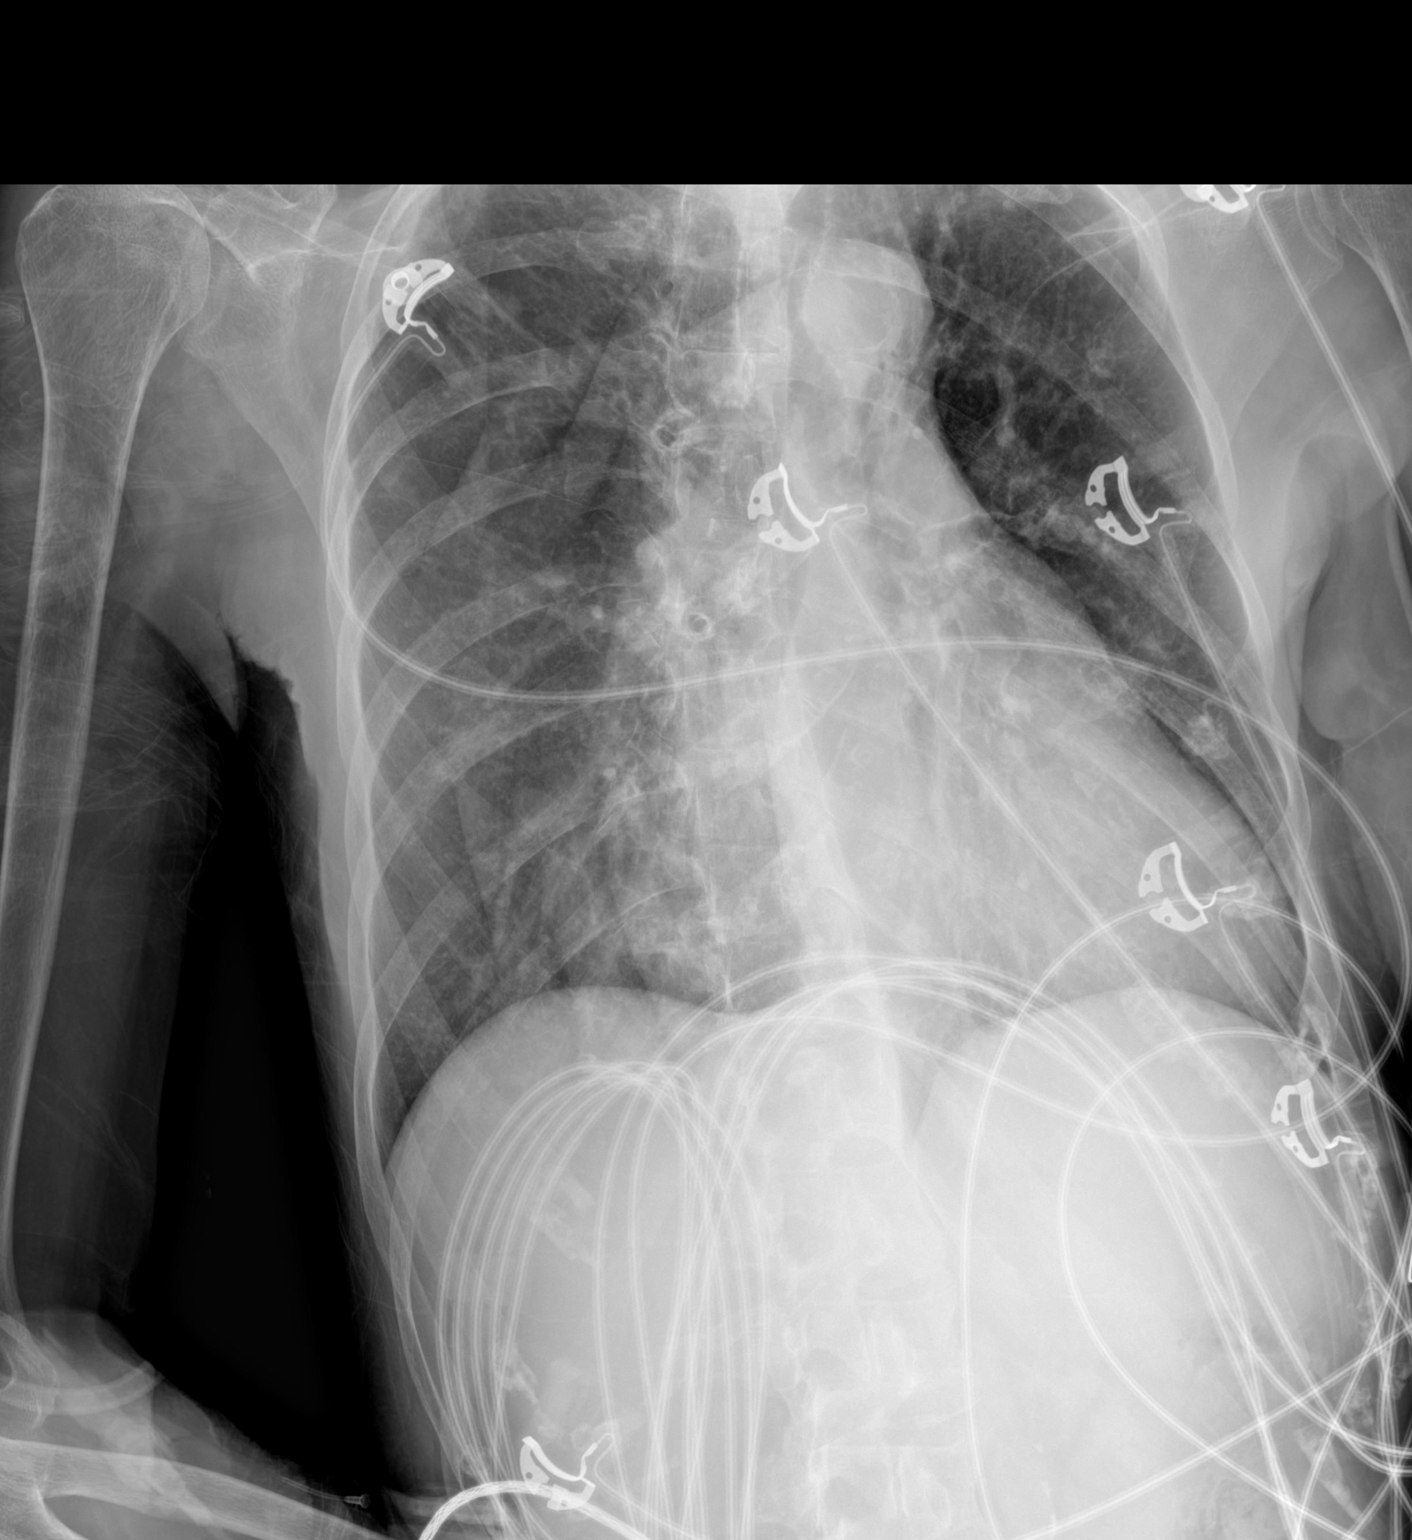

[1 of 1 positions shown; findings below may reference images not displayed]

FINDINGS: Cardiac enlargement without heart failure. Lungs clear without
infiltrate or effusion. Mild apical scarring bilaterally.
IMPRESSION: No active disease.

## 2022-03-10 IMAGING — CR DG HUMERUS 2V *L*
2 series · 2 of 2 positions shown · non-contrast
Comparison: None.

CLINICAL DATA: Fall.  Left upper arm pain.

EXAM:
LEFT HUMERUS - 2+ VIEW

[humerus ap]
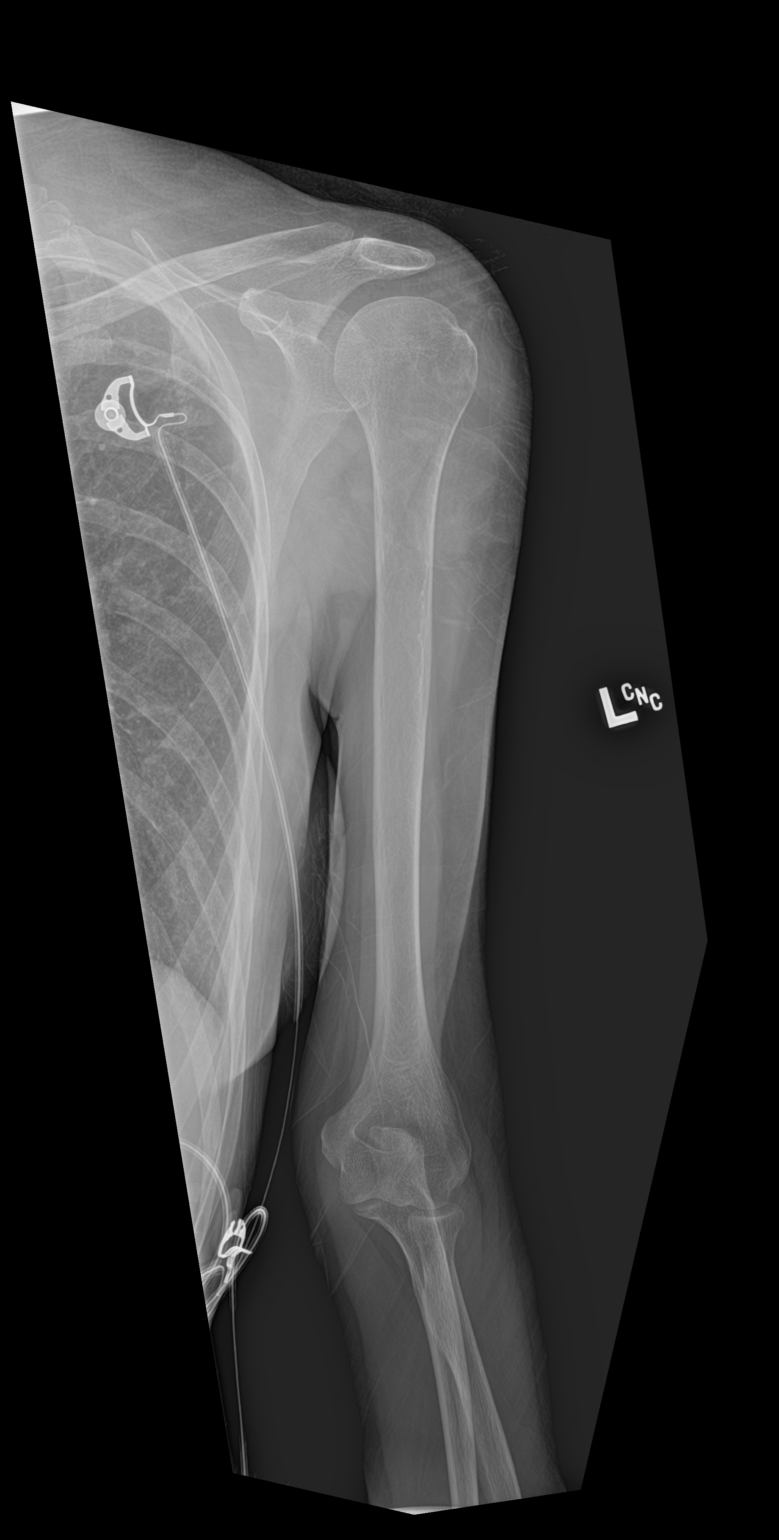

[humerus lat]
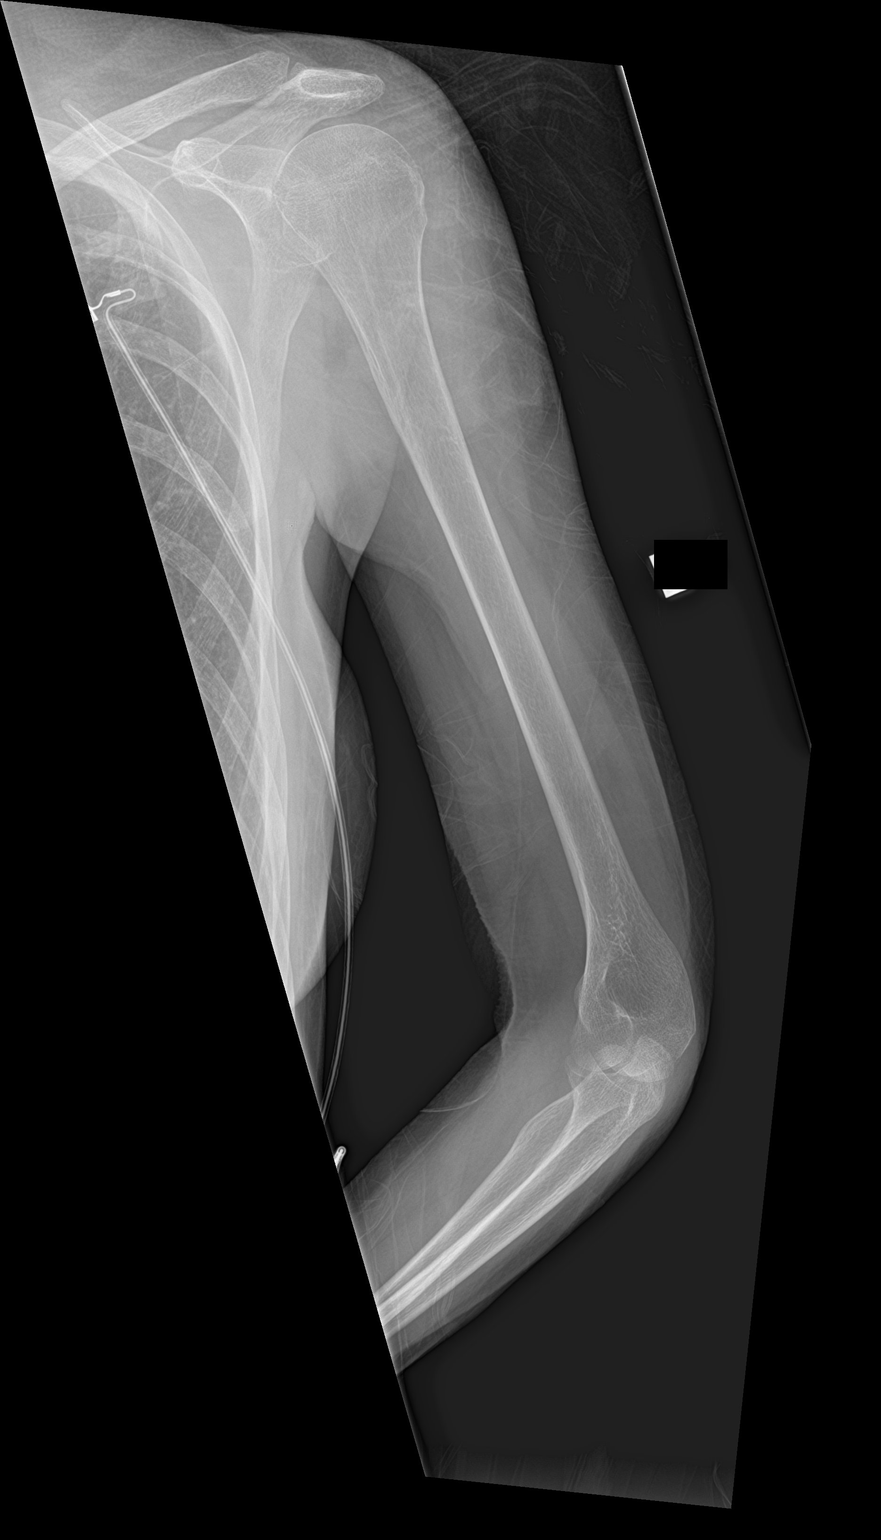

[2 of 2 positions shown; findings below may reference images not displayed]

FINDINGS: There is no evidence of fracture or other focal bone lesions. Soft
tissues are unremarkable.
IMPRESSION: Negative.

## 2022-06-14 ENCOUNTER — Non-Acute Institutional Stay: Payer: Medicare Other | Admitting: Primary Care

## 2022-06-14 DIAGNOSIS — F03918 Unspecified dementia, unspecified severity, with other behavioral disturbance: Secondary | ICD-10-CM

## 2022-06-14 DIAGNOSIS — F0280 Dementia in other diseases classified elsewhere without behavioral disturbance: Secondary | ICD-10-CM

## 2022-06-14 DIAGNOSIS — Z515 Encounter for palliative care: Secondary | ICD-10-CM

## 2022-06-14 DIAGNOSIS — R5381 Other malaise: Secondary | ICD-10-CM

## 2022-06-14 NOTE — Progress Notes (Signed)
Designer, jewellery Palliative Care Consult Note Telephone: [phone]  Fax: [phone]    Date of encounter: 06/14/22 12:01 PM PATIENT NAME: Carol Beard [place] [address]   [phone] (home)  DOB: 1946-06-14 MRN: [id number] PRIMARY CARE PROVIDER:    Brayton Mars, MD,  [address] [place] [address] [phone]  [place]:   Brayton Mars, [place],  [place] [address] [phone]  RESPONSIBLE PARTY:    Contact Information     Name Relation Home Work [address]   Jasia, [place] Brother [phone]  [phone]        I met face to face with patient in [facility] facility. Palliative Care was asked to follow this patient by consultation request of  Brayton Mars, MD to address advance care planning and complex medical decision making. This is a follow up visit.                                   ASSESSMENT AND PLAN / RECOMMENDATIONS:   Advance Care Planning/Goals of Care: Goals include to maximize quality of life and symptom management. Patient/health care surrogate gave his/her permission to discuss.Our advance care planning conversation included a discussion about:    The value and importance of advance care planning  Exploration of personal, cultural or spiritual beliefs that might influence medical decisions  Exploration of goals of care in the event of a sudden injury or illness  Identification of a healthcare agent - brother [name]  Review and updating  of an  advance directive document . Decision to de-escalate disease focused treatments due to poor prognosis. States he wants comfort measures but not hospice eligible Talked to pOA by phone with Carol Beard as witness. Updated MOST form. Uploaded to [facility]. CODE STATUS: DNR  I witnessed  a MOST form today and reviewed with POA for remote witnessing. The patient and family outlined their wishes for the following treatment  decisions:  Cardiopulmonary Resuscitation: Do Not Attempt Resuscitation (DNR/No CPR)  Medical Interventions: Comfort Measures: Keep clean, warm, and dry. Use medication by any route, positioning, wound care, and other measures to relieve pain and suffering. Use oxygen, suction and manual treatment of airway obstruction as needed for comfort. Do not transfer to the hospital unless comfort needs cannot be met in current location.  Antibiotics: Antibiotics if indicated  IV Fluids: IV fluids if indicated  Feeding Tube: No feeding tube    Symptom Management/Plan:  Patient in her room in w/c, at her baseline. Her speech is deteriorating, and she is not able to answer any questions now. Her pOA also has noticed this. [employer] had contacted POA for review of MOST form. We did that today as 2 witnesses for updating form.  Patient eating about 50%. Slight weight loss, 5% over several months. Her dental hygiene is poor today. Recommend to add to care plan for brushing. No signs pain or distress, but decline albeit slow, is evident. She can continue to self propel in w/c.   Follow up Palliative Care Visit: Palliative care will continue to follow for complex medical decision making, advance care planning, and clarification of goals. Return 6-8 weeks or prn.  I spent 20 minutes providing this consultation. More than 50% of the time in this consultation was spent in counseling and care coordination.  PPS: 40%  HOSPICE ELIGIBILITY/DIAGNOSIS: TBD  Chief Complaint: debility,  decline  HISTORY OF PRESENT ILLNESS:  Carol Beard is a 76 y.o. year old female  with PPA, dementia, debility and immobility . Patient seen today to review palliative care needs to include medical decision making and advance care planning as appropriate.   History obtained from review of EMR, discussion with primary team, and interview with family, facility staff/caregiver and/or Ms. Pearman.  I reviewed available labs,  medications, imaging, studies and related documents from the EMR.  Records reviewed and summarized above.   ROS/staff  General: NAD ENMT: denies dysphagia, poor dental hygiene Pulmonary: denies cough, denies increased SOB Abdomen: endorses fair appetite, denies constipation, endorses incontinence of bowel GU: denies dysuria, endorses incontinence of urine MSK:  denies increased weakness,  no falls reported Skin: denies rashes or wounds Neurological: denies pain, denies insomnia Psych: Endorses flat mood   Physical Exam: Current and past weights: 92 lbs., 5% loss Constitutional: NAD General: frail appearing, thin EYES: anicteric sclera, lids intact, no discharge  ENMT: intact hearing, oral mucous membranes dry, dentition intact, needs dental  hygiene CV: S1S2, RRR, no LE edema Pulmonary: LCTA, no increased work of breathing, no cough, room air Abdomen: intake 50%, normo-active BS + 4 quadrants, soft and non tender, no ascites TWY:SORTQS  sarcopenia, moves all extremities,  non ambulatory Skin: warm and dry, no rashes or wounds on visible skin Neuro:  + generalized weakness, +  cognitive impairment, non-anxious affect   Thank you for the opportunity to participate in the care of Carol Beard.  The palliative care team will continue to follow. Please call our office at [phone] if we can be of additional assistance.   Jason Coop, NP DNP, AGPCNP-BC  COVID-19 PATIENT SCREENING TOOL Asked and negative response unless otherwise noted:   Have you had symptoms of covid, tested positive or been in contact with someone with symptoms/positive test in the past 5-10 days?

## 2022-08-29 ENCOUNTER — Non-Acute Institutional Stay: Payer: Medicare Other | Admitting: Primary Care

## 2022-08-29 DIAGNOSIS — R5381 Other malaise: Secondary | ICD-10-CM

## 2022-08-29 DIAGNOSIS — F03918 Unspecified dementia, unspecified severity, with other behavioral disturbance: Secondary | ICD-10-CM

## 2022-08-29 DIAGNOSIS — Z515 Encounter for palliative care: Secondary | ICD-10-CM

## 2022-08-29 DIAGNOSIS — R634 Abnormal weight loss: Secondary | ICD-10-CM

## 2022-08-29 NOTE — Progress Notes (Signed)
[facility] Palliative Care Consult Note Telephone: [phone]  Fax: [phone]    Date of encounter: 08/29/22 2:38 PM PATIENT NAME: Carol Beard [id number]   [phone] (home)  DOB: [birth year] MRN: [id number] PRIMARY CARE PROVIDER:    Brayton Mars, MD,  [address] [place] [address] [phone]  [place]:   Brayton Mars, [place],  [place] [address] [phone]  RESPONSIBLE PARTY:    Contact Information     Name Relation Home Work [address]   Carol, Beard Brother [phone]  [phone]        I met face to face with patient in [facility] facility. Palliative Care was asked to follow this patient by consultation request of  Brayton Mars, MD to address advance care planning and complex medical decision making. This is a follow up visit.                                   ASSESSMENT AND PLAN / RECOMMENDATIONS:   Advance Care Planning/Goals of Care: Goals include to maximize quality of life and symptom management.  Identification of a healthcare agent - brother Carol Beard CODE STATUS: DNR, most on file  Symptom Management/Plan:  Patient in day room after lunch. Can smile and answer yes/no. Is visibly more thin and cathectic since last visit. Review of weight shows 9 % weight loss in 6 months.  May be eligible for hospice due to progression of disease.   Follow up Palliative Care Visit: Palliative care will continue to follow for complex medical decision making, advance care planning, and clarification of goals. Return 4 weeks or prn.  This visit was coded based on medical decision making (MDM).  PPS: 30%  HOSPICE ELIGIBILITY/DIAGNOSIS: yes/ weight loss Chief Complaint: Dementia, wt loss  HISTORY OF PRESENT ILLNESS:  Carol Beard is a 76 y.o. year old female  with dementia, primary progressive aphasia . Patient seen today to review palliative  care needs to include medical decision making and advance care planning as appropriate.   History obtained from review of EMR, discussion with primary team, and interview with family, facility staff/caregiver and/or Carol Beard.  I reviewed available labs, medications, imaging, studies and related documents from the EMR.  Records reviewed and summarized above.   ROS/staff  General: NAD ENMT: denies dysphagia Pulmonary: denies cough, denies increased SOB Abdomen: endorses fair  appetite, denies constipation, endorses incontinence of bowel GU: denies dysuria, endorses incontinence of urine MSK:  denies  increased weakness,  no falls reported Skin: denies rashes or wounds Neurological: denies pain, denies insomnia Psych: Endorses positive mood, smiles   Physical Exam: Current and past weights:89 lbs, 98 lbs in 3/23. 9% weight loss.  Constitutional: NAD General: frail appearing, thin EYES: anicteric sclera, lids intact, no discharge  ENMT: intact hearing, oral mucous membranes moist, dentition intact CV: S1S2, RRR,  slight LE edema Pulmonary: LCTA, no increased work of breathing, no cough, room air Abdomen: intake 70%, soft and non tender, no ascites MSK: severe  sarcopenia, moves all extremities, non  ambulatory Skin: warm and dry, no rashes or wounds on visible skin Neuro:  + generalized weakness,  severe  cognitive impairment, non-anxious affect   Thank you for the opportunity to participate in the care of Carol Beard.  The palliative care team will continue to follow. Please call our office at [phone]  if we can be of additional assistance.   Jason Coop, NP DNP, AGPCNP-BC  COVID-19 PATIENT SCREENING TOOL Asked and negative response unless otherwise noted:   Have you had symptoms of covid, tested positive or been in contact with someone with symptoms/positive test in the past 5-10 days?

## 2022-10-14 ENCOUNTER — Non-Acute Institutional Stay: Payer: Medicare Other | Admitting: Primary Care

## 2022-10-14 DIAGNOSIS — F03918 Unspecified dementia, unspecified severity, with other behavioral disturbance: Secondary | ICD-10-CM

## 2022-10-14 DIAGNOSIS — Z515 Encounter for palliative care: Secondary | ICD-10-CM

## 2022-10-14 DIAGNOSIS — F0280 Dementia in other diseases classified elsewhere without behavioral disturbance: Secondary | ICD-10-CM

## 2022-10-14 NOTE — Progress Notes (Signed)
[facility] Palliative Care Consult Note Telephone: [phone]  Fax: [phone]    Date of encounter: 10/14/22 11:59 AM PATIENT NAME: Athenia Rys [place] [address]   [phone] (home)  DOB: May 24, 1946 MRN: [id number] PRIMARY CARE PROVIDER:    Brayton Mars, MD,  [address] [place] [address] [phone]  [place]:   Brayton Mars, [place],  [place] [address] [phone]  RESPONSIBLE PARTY:    Contact Information     Name Relation Home Work [address]   Nai, [place] Brother [phone]  [phone]       I met face to face with patient in [facility] facility. Palliative Care was asked to follow this patient by consultation request of  Brayton Mars, MD to address advance care planning and complex medical decision making. This is a follow up visit.                                   ASSESSMENT AND PLAN / RECOMMENDATIONS:   Advance Care Planning/Goals of Care: Goals include to maximize quality of life and symptom management.  CODE STATUS: DNR  Symptom Management/Plan:  Patient at her  baseline, sitting in the activity room but not able to do activities. Staff reports good appetite. She does still have agitation with 1 particular resident. Otherwise she is calm and no behavior disturbances.   Follow up Palliative Care Visit: Palliative care will continue to follow for complex medical decision making, advance care planning, and clarification of goals. Return 8 weeks or prn.  I spent 15 minutes providing this consultation. More than 50% of the time in this consultation was spent in counseling and care coordination.   PPS: 30%  HOSPICE ELIGIBILITY/DIAGNOSIS: TBD  Chief Complaint: debility  HISTORY OF PRESENT ILLNESS:  MADELIENE TEJERA is a 76 y.o. year old female  with PPA, dementia .   History obtained from review of EMR, discussion with primary team, and  interview with family, facility staff/caregiver and/or Ms. Carby.  I reviewed available labs, medications, imaging, studies and related documents from the EMR.  Records reviewed and summarized above.   ROS/staff  General: NAD ENMT: denies dysphagia Pulmonary: denies cough, denies increased SOB Abdomen: endorses good appetite, denies constipation, endorses incontinence of bowel GU: denies dysuria, endorses incontinence of urine MSK:  denies increased weakness, no falls reported Skin: denies rashes or wounds Neurological: denies pain, denies insomnia Psych: Endorses positive mood Heme/lymph/immuno: denies bruises, abnormal bleeding  Physical Exam: Current and past weights: 89 lbs Constitutional: NAD General: frail appearing, thin EYES: anicteric sclera, lids intact, no discharge  ENMT: intact hearing, oral mucous membranes moist, dentition intact CV: no LE edema Pulmonary: no increased work of breathing, no cough, room air Abdomen: intake 80%,  soft and non tender, no ascites GU: deferred MSK: + sarcopenia, moves all extremities, non ambulatory Skin: warm and dry, no rashes or wounds on visible skin Neuro:  + generalized weakness,  severe cognitive impairment Psych: non-anxious affect, A and O x 3  Hem/lymph/immuno: no widespread bruising   Thank you for the opportunity to participate in the care of Ms. Najarian.  The palliative care team will continue to follow. Please call our office at [phone] if we can be of additional assistance.   Jason Coop DNP, MPH, AGPCNP-BC, ACHPN   COVID-19 PATIENT SCREENING TOOL Asked and negative response unless otherwise  noted:   Have you had symptoms of covid, tested positive or been in contact with someone with symptoms/positive test in the past 5-10 days?

## 2022-12-18 ENCOUNTER — Encounter: Payer: Self-pay | Admitting: Nurse Practitioner

## 2022-12-18 ENCOUNTER — Non-Acute Institutional Stay: Payer: Medicare Other | Admitting: Nurse Practitioner

## 2022-12-18 DIAGNOSIS — Z515 Encounter for palliative care: Secondary | ICD-10-CM

## 2022-12-18 DIAGNOSIS — F03918 Unspecified dementia, unspecified severity, with other behavioral disturbance: Secondary | ICD-10-CM

## 2022-12-18 DIAGNOSIS — R5381 Other malaise: Secondary | ICD-10-CM

## 2022-12-18 NOTE — Progress Notes (Addendum)
[facility], [facility] Palliative Care Consult Note Telephone: [phone]  Fax: [phone]    Date of encounter: 12/18/22 4:50 PM PATIENT NAME: Lonni Dirden [place] [address]   [phone] (home)  DOB: October 25, 1946 MRN: [id number] PRIMARY CARE PROVIDER:    [facility] [address] [place] [address] [phone] RESPONSIBLE PARTY:    Contact Information     Name Relation Home Work [address]   Krisanne, Lich Brother [phone]  [phone]      I met face to face with patient in facility. Palliative Care was asked to follow this patient by consultation request of  Brayton Mars, MD [facility] to address advance care planning and complex medical decision making. This is a follow up visit.                                  ASSESSMENT AND PLAN / RECOMMENDATIONS:  Symptom Management/Plan: 1. Advance Care Planning;  DNR 2. Goals of Care: Goals include to maximize quality of life and symptom management. Our advance care planning conversation included a discussion about:    The value and importance of advance care planning  Exploration of personal, cultural or spiritual beliefs that might influence medical decisions  Exploration of goals of care in the event of a sudden injury or illness  Identification and preparation of a healthcare agent  Review and updating or creation of an advance directive document. 3. Palliative care encounter; Palliative care encounter; Palliative medicine team will continue to support patient, patient's family, and medical team. Visit consisted of counseling and education dealing with the complex and emotionally intense issues of symptom management and palliative care in the setting of serious and potentially life-threatening illness  4. Dementia/Debility progressive, reviewed weights, continue to monitor weights, moods, encourage socialization, activities, oob; fall risk 10/14/2022  weight 89 lbs 12/17/2022 weight 91.6 lbs stable  Follow up Palliative Care Visit: Palliative care will continue to follow for complex medical decision making, advance care planning, and clarification of goals. Return 4 to 8 weeks or prn.  I spent 45 minutes providing this consultation. More than 50% of the time in this consultation was spent in counseling and care coordination. PPS: 40% Chief Complaint: Follow up palliative consult for complex medical decision making, address goals, manage ongoing symptoms  HISTORY OF PRESENT ILLNESS:  KALLISTA PAE is a 77 y.o. year old female  with multiple medical problems including Dementia with behaviors, progressive aphasia, osteoporosis, h/o breast nodule, anxiety. Ms Manka resides at [facility], requires to be placed in w/c where she able to sit up. Ms Pitcher requires assistance with bathing, dressing, incontinence. Per staff Ms Mares requires assistance with tray setup, prompting for eating with fair appetite, stable weight with last weight 91.6 lbs. Ms .Tatlock is cognitively impaired, word salad, few intermit clear words. Staff endorses no recent falls, infections, wounds, hospitalizations. At present Ms Hunsberger is sitting in w/c in the dayroom at a table with 3 other residents. Ms Bilbo does make eye contact, mumbles words, smiles and laughs when not knowing an answer. No meaningful discussion with cognitive impairment. Ms Lawlor was cooperative with assessment. Medical goals, medications, weights, poc reviewed. I have attempted to contact Ms Lopresti brother POA for update on pc visit, updated staff, no new changes to poc with today visit. Supportive visit, will continue to monitor weights, disease  progression, monitor for symptoms, decline.   History obtained from review of EMR, discussion with primary team, and interview with family, facility staff/caregiver and/or Ms. Ferrara.  I reviewed available labs, medications, imaging, studies and related  documents from the EMR.  Records reviewed and summarized above.   ROS 10 point system reviewed with staff as Ms Feld is cognitively impaired all negative except HPI  Physical Exam: Constitutional: NAD General: frail appearing, thin, confused female EYES: lids intact ENMT: oral mucous membranes moist CV: S1S2, RRR Pulmonary: LCTA Abdomen: soft and non tender MSK: w/c dependent Skin: warm and dry Neuro:  + generalized weakness,  + cognitive impairment Psych: confused, smiling Thank you for the opportunity to participate in the care of Ms. Wescoat. Please call our office at [phone] if we can be of additional assistance.   Peyten Punches Ihor Gully, NP

## 2023-02-12 ENCOUNTER — Non-Acute Institutional Stay: Payer: Medicare Other | Admitting: Nurse Practitioner

## 2023-02-12 ENCOUNTER — Encounter: Payer: Self-pay | Admitting: Nurse Practitioner

## 2023-02-12 DIAGNOSIS — Z515 Encounter for palliative care: Secondary | ICD-10-CM

## 2023-02-12 DIAGNOSIS — R5381 Other malaise: Secondary | ICD-10-CM

## 2023-02-12 DIAGNOSIS — F03918 Unspecified dementia, unspecified severity, with other behavioral disturbance: Secondary | ICD-10-CM

## 2023-02-12 NOTE — Progress Notes (Addendum)
[facility] Palliative Care Consult Note Telephone: [phone]  Fax: [phone]    Date of encounter: 02/12/23 2:42 PM PATIENT NAME: Carol Beard [id number]   [phone] (home)  DOB: [birth year] MRN: [id number] PRIMARY CARE PROVIDER:    [facility] [address] [place] [address] [phone]  RESPONSIBLE PARTY:    Contact Information     Name Relation Home Work [address]   Carol Beard, Carol Beard Brother [phone]  [phone]     I met face to face with patient in facility. Palliative Care was asked to follow this patient by consultation request of  Brayton Mars, MD [facility] to address advance care planning and complex medical decision making. This is a follow up visit.                                  ASSESSMENT AND PLAN / RECOMMENDATIONS:  Symptom Management/Plan: 1. Advance Care Planning;  DNR 2. Goals of Care: Goals include to maximize quality of life and symptom management. Our advance care planning conversation included a discussion about:    The value and importance of advance care planning  Exploration of personal, cultural or spiritual beliefs that might influence medical decisions  Exploration of goals of care in the event of a sudden injury or illness  Identification and preparation of a healthcare agent  Review and updating or creation of an advance directive document. 3. Palliative care encounter; Palliative care encounter; Palliative medicine team will continue to support patient, patient's family, and medical team. Visit consisted of counseling and education dealing with the complex and emotionally intense issues of symptom management and palliative care in the setting of serious and potentially life-threatening illness   4. Dementia/Debility progressive, reviewed weights, continue to monitor weights, moods, encourage socialization, activities, oob; fall risk; continue  nutrition, supplements,  10/14/2022 weight 89 lbs 12/17/2022 weight 91.6 lbs   01/16/2023 weight 93.6 lbs Follow up Palliative Care Visit: PC f/u visit further discussion monitor trends of appetite, weights, monitor for functional, cognitive decline with chronic disease progression, assess any active symptoms, supportive role. Palliative care will continue to follow for complex medical decision making, advance care planning, and clarification of goals. Return 4 to 8 weeks or prn.   I spent 36 minutes providing this consultation. More than 50% of the time in this consultation was spent in counseling and care coordination. PPS: 40% Chief Complaint: Follow up palliative consult for complex medical decision making, address goals, manage ongoing symptoms   HISTORY OF PRESENT ILLNESS:  Carol Beard is a 77 y.o. year old female  with multiple medical problems including Dementia with behaviors, progressive aphasia, osteoporosis, h/o breast nodule, anxiety. Carol Beard resides at [facility], requires to be placed in w/c where she able to sit up. Carol Beard requires assistance with bathing, dressing, incontinence. Per staff Carol Beard requires assistance with tray setup, prompting for eating with fair appetite, stable weight with last weight 91.6 lbs. Carol Beard is cognitively impaired, word salad, few intermit clear words. Staff endorses no recent falls, infections, wounds, hospitalizations. At present Carol Beard is sitting in w/c in the dayroom. Carol Beard does make eye contact, mumbles words, smiles and laughs when not knowing an answer. No meaningful discussion with cognitive impairment. Carol Beard was cooperative with assessment. Medical goals, medications, weights, poc reviewed. I have attempted to  contact brother, Carol Beard. Continue to try to get Carol Beard to eat, supplements, continue with engaging social presence, oob being in day room. Support provided,   I have attempted to contact Carol Beard brother POA  for update on pc visit, updated staff, no new changes to poc with today visit. Purpose of today PC f/u visit further discussion monitor trends of appetite, weights, monitor for functional, cognitive decline with chronic disease progression, assess any active symptoms, supportive role.  02/14/2023; I re-attempted to contact brother, message left   History obtained from review of EMR, discussion with primary team, and interview with family, facility staff/caregiver and/or Carol Beard.  I reviewed available labs, medications, imaging, studies and related documents from the EMR.  Records reviewed and summarized above.  Physical Exam: General: frail appearing, thin, confused female ENMT: oral mucous membranes moist CV: S1S2, RRR Pulmonary: LCTA Abdomen: soft and non tender MSK: w/c dependent Skin: warm and dry Neuro:  + generalized weakness,  + cognitive impairment Psych: confused, smiling  Thank you for the opportunity to participate in the care of Carol Beard. Please call our office at [phone] if we can be of additional assistance.   Carol Mckowen Ihor Gully, NP

## 2023-04-02 ENCOUNTER — Encounter: Payer: Self-pay | Admitting: Nurse Practitioner

## 2023-04-02 ENCOUNTER — Non-Acute Institutional Stay: Payer: Medicare Other | Admitting: Nurse Practitioner

## 2023-04-02 DIAGNOSIS — Z515 Encounter for palliative care: Secondary | ICD-10-CM

## 2023-04-02 DIAGNOSIS — R5381 Other malaise: Secondary | ICD-10-CM

## 2023-04-02 DIAGNOSIS — F03918 Unspecified dementia, unspecified severity, with other behavioral disturbance: Secondary | ICD-10-CM

## 2023-04-02 NOTE — Progress Notes (Signed)
    Therapist, nutritional Palliative Care Consult Note Telephone: [phone]  Fax: [phone]    Date of encounter: 04/02/23 3:33 PM PATIENT NAME: Carol Beard [address] [place] [address]   [phone] (home)  DOB: Oct 22, 1946 MRN: [id number] PRIMARY CARE PROVIDER:    [facility]  RESPONSIBLE PARTY:    Contact Information     Name Relation Home Work [address]   Carol, Beard Brother [phone]  [phone]     I met face to face with patient in facility. Palliative Care was asked to follow this patient by consultation request of  Carol [place], MD Carol Beard [facility] to address advance care planning and complex medical decision making. This is a follow up visit.                                  ASSESSMENT AND PLAN / RECOMMENDATIONS:  Symptom Management/Plan: 1. Advance Care Planning;  DNR 2. Palliative care encounter; Palliative care encounter; Palliative medicine team will continue to support patient, patient's family, and medical team. Visit consisted of counseling and education dealing with the complex and emotionally intense issues of symptom management and palliative care in the setting of serious and potentially life-threatening illness   3. Dementia/Debility progressive, attempted to review weights, continue to monitor weights, moods, encourage socialization, activities, oob; fall risk; continue nutrition, supplements,  10/14/2022 weight 89 lbs 12/17/2022 weight 91.6 lbs  01/16/2023 weight 93.6 lbs No recent weight available Follow up Palliative Care Visit: PC f/u visit further discussion monitor trends of appetite, weights, monitor for functional, cognitive decline with chronic disease progression, assess any active symptoms, supportive role. Palliative care will continue to follow for complex medical decision making, advance care planning, and clarification of goals. Return 4 to 8 weeks or prn.   I spent 46 minutes  providing this consultation. More than 50% of the time in this consultation was spent in counseling and care coordination. PPS: 40% Chief Complaint: Follow up palliative consult for complex medical decision making, address goals, manage ongoing symptoms   HISTORY OF PRESENT ILLNESS:  Carol Beard is a 77 y.o. year old female  with multiple medical problems including Dementia with behaviors, progressive aphasia, osteoporosis, h/o breast nodule, anxiety. Carol Beard resides at [facility], requires to be placed in w/c where she able to sit up. Carol Beard requires assistance with bathing, dressing, incontinence. Per staff Carol Beard requires assistance with tray setup, prompting for eating with fair appetite, stable weights. Carol Beard is cognitively impaired, word salad, few intermit clear words. Staff endorses no recent falls, infections, wounds, hospitalizations. At present Carol Beard is sitting in w/c in the dayroom.    History obtained from review of EMR, discussion with primary team, and interview with family, facility staff/caregiver and/or Carol. Beard.  I reviewed available labs, medications, imaging, studies and related documents from the EMR.  Records reviewed and summarized above.  Physical Exam: General: frail appearing, thin, confused female ENMT: oral mucous membranes moist CV: S1S2, RRR Pulmonary: breath sounds clear MSK: w/c dependent Neuro:  + generalized weakness,  + cognitive impairment Psych: confused, smiling Thank you for the opportunity to participate in the care of Carol Beard. Please call our office at [phone] if we can be of additional assistance.   Jacy Howat Prince Rome, NP

## 2023-05-21 ENCOUNTER — Encounter: Payer: Self-pay | Admitting: Nurse Practitioner

## 2023-05-21 ENCOUNTER — Non-Acute Institutional Stay: Payer: Medicare Other | Admitting: Nurse Practitioner

## 2023-05-21 DIAGNOSIS — R5381 Other malaise: Secondary | ICD-10-CM

## 2023-05-21 DIAGNOSIS — F03918 Unspecified dementia, unspecified severity, with other behavioral disturbance: Secondary | ICD-10-CM

## 2023-05-21 DIAGNOSIS — Z515 Encounter for palliative care: Secondary | ICD-10-CM

## 2023-05-21 NOTE — Progress Notes (Signed)
    Therapist, nutritional Palliative Care Consult Note Telephone: [phone]  Fax: [phone]    Date of encounter: 05/21/23 9:00 PM PATIENT NAME: Carol Beard [address] [place] [address]   [phone] (home)  DOB: 10-Jun-1946 MRN: [id number] PRIMARY CARE PROVIDER:    [facility]  RESPONSIBLE PARTY:    Contact Information     Name Relation Home Work [address]   Emmalynne, Mendyk Brother [phone]  [phone]     I met face to face with patient in facility. Palliative Care was asked to follow this patient by consultation request of  Dale [place], MD [facility] to address advance care planning and complex medical decision making. This is a follow up visit.                                  ASSESSMENT AND PLAN / RECOMMENDATIONS:  Symptom Management/Plan: 1. Advance Care Planning;  DNR 2. Palliative care encounter; Palliative care encounter; Palliative medicine team will continue to support patient, patient's family, and medical team. Visit consisted of counseling and education dealing with the complex and emotionally intense issues of symptom management and palliative care in the setting of serious and potentially life-threatening illness   3. Dementia/Debility progressive, attempted to review weights, continue to monitor weights, moods, encourage socialization, activities, oob; fall risk; continue nutrition, supplements,   01/16/2023 weight 93.6 lbs 04/16/2023 weight 92.8 lbs  Follow up Palliative Care Visit: PC f/u visit further discussion monitor trends of appetite, weights, monitor for functional, cognitive decline with chronic disease progression, assess any active symptoms, supportive role. Palliative care will continue to follow for complex medical decision making, advance care planning, and clarification of goals. Return 4 to 8 weeks or prn.   I spent 45 minutes providing this consultation. More than 50% of the time in  this consultation was spent in counseling and care coordination. PPS: 40% Chief Complaint: Follow up palliative consult for complex medical decision making, address goals, manage ongoing symptoms   HISTORY OF PRESENT ILLNESS:  Carol Beard is a 77 y.o. year old female  with multiple medical problems including Dementia with behaviors, progressive aphasia, osteoporosis, h/o breast nodule, anxiety. Carol Beard resides at [facility], requires to be placed in w/c where she able to sit up. Carol Beard requires assistance with bathing, dressing, incontinence. Per staff Carol Beard requires assistance with tray setup, prompting for eating with fair appetite, stable weights. Carol .Beard is cognitively impaired, word salad, few intermit clear words. Staff endorses no recent falls, infections, wounds, hospitalizations. At present Carol Beard is sitting in w/c in the dayroom.    History obtained from review of EMR, discussion with primary team, and interview with family, facility staff/caregiver and/or Carol. Steen.  I reviewed available labs, medications, imaging, studies and related documents from the EMR.  Records reviewed and summarized above.  Physical Exam: General: frail appearing, thin, confused female ENMT: oral mucous membranes moist CV: S1S2, RRR Pulmonary: breath sounds clear MSK: w/c dependent Neuro:  + generalized weakness,  + cognitive impairment Psych: confused, smiling Thank you for the opportunity to participate in the care of Carol. Beard. Please call our office at [phone] if we can be of additional assistance.   Liona Wengert Prince Rome, NP
